# Patient Record
Sex: Female | Born: 1995 | Race: White | Hispanic: No | Marital: Single | Smoking: Current every day smoker
Health system: Southern US, Community
[De-identification: ages and names within clinical notes are randomized; demographics above are authoritative.]

## PROBLEM LIST (undated history)

## (undated) ENCOUNTER — Ambulatory Visit: Admission: EM | Source: Home / Self Care

## (undated) DIAGNOSIS — K219 Gastro-esophageal reflux disease without esophagitis: Secondary | ICD-10-CM

## (undated) DIAGNOSIS — J302 Other seasonal allergic rhinitis: Secondary | ICD-10-CM

## (undated) DIAGNOSIS — T753XXA Motion sickness, initial encounter: Secondary | ICD-10-CM

## (undated) DIAGNOSIS — R635 Abnormal weight gain: Secondary | ICD-10-CM

## (undated) DIAGNOSIS — D649 Anemia, unspecified: Secondary | ICD-10-CM

## (undated) DIAGNOSIS — K625 Hemorrhage of anus and rectum: Secondary | ICD-10-CM

## (undated) HISTORY — PX: OTHER SURGICAL HISTORY: SHX169

## (undated) HISTORY — DX: Other seasonal allergic rhinitis: J30.2

## (undated) HISTORY — DX: Abnormal weight gain: R63.5

## (undated) HISTORY — PX: MOUTH SURGERY: SHX715

---
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
## 2014-01-30 ENCOUNTER — Ambulatory Visit (INDEPENDENT_AMBULATORY_CARE_PROVIDER_SITE_OTHER): Payer: BC Managed Care – PPO | Admitting: Women's Health

## 2014-01-30 ENCOUNTER — Encounter: Payer: Self-pay | Admitting: Women's Health

## 2014-01-30 DIAGNOSIS — N390 Urinary tract infection, site not specified: Secondary | ICD-10-CM

## 2014-01-30 DIAGNOSIS — B3749 Other urogenital candidiasis: Secondary | ICD-10-CM

## 2014-01-30 LAB — URINALYSIS W MICROSCOPIC + REFLEX CULTURE
BILIRUBIN URINE: NEGATIVE
CASTS: NONE SEEN
CRYSTALS: NONE SEEN
GLUCOSE, UA: 100 mg/dL — AB
Nitrite: POSITIVE — AB
PH: 7 (ref 5.0–8.0)
Protein, ur: 100 mg/dL — AB
Specific Gravity, Urine: 1.015 (ref 1.005–1.030)
Urobilinogen, UA: 1 mg/dL (ref 0.0–1.0)

## 2014-01-30 MED ORDER — SULFAMETHOXAZOLE-TRIMETHOPRIM 800-160 MG PO TABS: 1.0000 | ORAL_TABLET | Freq: Two times a day (BID) | ORAL | Status: DC

## 2014-01-30 NOTE — Progress Notes (Signed)
Patient ID: Cheryl Morgan, female   DOB: 06/13/1996, 18 y.o.   MRN: [id number] Presents with burning with urination, increased urgency and different smell of urine for 4 days. Tried AZO with no relief.  Denies abnormal vaginal discharge, fever, abdominal pain. Monthly cycle on Skyla/same partner. States feels gets UTIs often relief with AZO the past.  UA: TNTC WBC, many bacteria, 3-6 RBC.  UTI  Plan: Bactrim 1 tablet BID x 5 days, use and side effects discussed. Counseled on UTI prevention. Instructed to call if no relief of symptoms.

## 2014-01-30 NOTE — Patient Instructions (Signed)
Urinary Tract Infection  Urinary tract infections (UTIs) can develop anywhere along your urinary tract. Your urinary tract is your body's drainage system for removing wastes and extra water. Your urinary tract includes two kidneys, two ureters, a bladder, and a urethra. Your kidneys are a pair of bean-shaped organs. Each kidney is about the size of your fist. They are located below your ribs, one on each side of your spine.  CAUSES  Infections are caused by microbes, which are microscopic organisms, including fungi, viruses, and bacteria. These organisms are so small that they can only be seen through a microscope. Bacteria are the microbes that most commonly cause UTIs.  SYMPTOMS   Symptoms of UTIs may vary by age and gender of the patient and by the location of the infection. Symptoms in Cheryl Morgan women typically include a frequent and intense urge to urinate and a painful, burning feeling in the bladder or urethra during urination. Older women and men are more likely to be tired, shaky, and weak and have muscle aches and abdominal pain. A fever may mean the infection is in your kidneys. Other symptoms of a kidney infection include pain in your back or sides below the ribs, nausea, and vomiting.  DIAGNOSIS  To diagnose a UTI, your caregiver will ask you about your symptoms. Your caregiver also will ask to provide a urine sample. The urine sample will be tested for bacteria and white blood cells. White blood cells are made by your body to help fight infection.  TREATMENT   Typically, UTIs can be treated with medication. Because most UTIs are caused by a bacterial infection, they usually can be treated with the use of antibiotics. The choice of antibiotic and length of treatment depend on your symptoms and the type of bacteria causing your infection.  HOME CARE INSTRUCTIONS   If you were prescribed antibiotics, take them exactly as your caregiver instructs you. Finish the medication even if you feel better after you  have only taken some of the medication.   Drink enough water and fluids to keep your urine clear or pale yellow.   Avoid caffeine, tea, and carbonated beverages. They tend to irritate your bladder.   Empty your bladder often. Avoid holding urine for long periods of time.   Empty your bladder before and after sexual intercourse.   After a bowel movement, women should cleanse from front to back. Use each tissue only once.  SEEK MEDICAL CARE IF:    You have back pain.   You develop a fever.   Your symptoms do not begin to resolve within 3 days.  SEEK IMMEDIATE MEDICAL CARE IF:    You have severe back pain or lower abdominal pain.   You develop chills.   You have nausea or vomiting.   You have continued burning or discomfort with urination.  MAKE SURE YOU:    Understand these instructions.   Will watch your condition.   Will get help right away if you are not doing well or get worse.  Document Released: 07/13/2005 Document Revised: 04/03/2012 Document Reviewed: 11/11/2011  Exit[employer] Patient Information 2014 [employer].

## 2014-02-03 ENCOUNTER — Other Ambulatory Visit: Payer: Self-pay | Admitting: Women's Health

## 2014-02-03 LAB — URINE CULTURE: Colony Count: 85000

## 2014-02-03 MED ORDER — CIPROFLOXACIN HCL 250 MG PO TABS: 250.0000 mg | ORAL_TABLET | Freq: Two times a day (BID) | ORAL | Status: DC

## 2014-07-02 ENCOUNTER — Encounter: Payer: Self-pay | Admitting: Gynecology

## 2014-07-02 ENCOUNTER — Ambulatory Visit (INDEPENDENT_AMBULATORY_CARE_PROVIDER_SITE_OTHER): Payer: BC Managed Care – PPO | Admitting: Gynecology

## 2014-07-02 VITALS — BP 120/76

## 2014-07-02 DIAGNOSIS — B002 Herpesviral gingivostomatitis and pharyngotonsillitis: Secondary | ICD-10-CM | POA: Insufficient documentation

## 2014-07-02 DIAGNOSIS — B3731 Acute candidiasis of vulva and vagina: Secondary | ICD-10-CM

## 2014-07-02 DIAGNOSIS — Z23 Encounter for immunization: Secondary | ICD-10-CM

## 2014-07-02 DIAGNOSIS — B373 Candidiasis of vulva and vagina: Secondary | ICD-10-CM

## 2014-07-02 DIAGNOSIS — Z113 Encounter for screening for infections with a predominantly sexual mode of transmission: Secondary | ICD-10-CM

## 2014-07-02 LAB — WET PREP FOR TRICH, YEAST, CLUE
CLUE CELLS WET PREP: NONE SEEN
TRICH WET PREP: NONE SEEN

## 2014-07-02 MED ORDER — VALACYCLOVIR HCL 500 MG PO TABS: ORAL_TABLET | ORAL | Status: DC

## 2014-07-02 MED ORDER — FLUCONAZOLE 150 MG PO TABS: 150.0000 mg | ORAL_TABLET | Freq: Once | ORAL | Status: DC

## 2014-07-02 NOTE — Addendum Note (Signed)
Addended by: Berna Spare A on: 07/02/2014 03:53 PM   Modules accepted: Orders

## 2014-07-02 NOTE — Progress Notes (Signed)
   18 year old who presented to the office today concerned about the possibility of having herpes labialis. Patient has had intercourse at 2 different individuals over the course of one month. She has the [place] IUD for contraception. Patient would like have a formal STD screening.  Exam: HEENT: Very small vesicular areas upper lip probable herpes labialis, no pharyngeal lesions seen : Pelvic exam: Bartholin urethra Skene glands within normal limits Vagina: No gross lesions or discharge Cervix: No gross lesions or discharge  Wet prep done demonstrating few yeast and few red blood cells and moderate bacteria  GC and Chlamydia culture obtained results pending at time of this dictation  Assessment/plan: Yeast vaginitis will be treated with Diflucan 150 mg one by mouth today. 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 STD screening consisting of herpes panel, HIV, RPR, hepatitis B, hepatitis C obtained today Flu vaccine received today. Patient has received a full HPV vaccine series in the past

## 2014-07-02 NOTE — Patient Instructions (Signed)
Herpes Labialis You have a fever blister or cold sore (herpes labialis). These painful, grouped sores are caused by one of the herpes viruses (HSV1 most commonly). They are usually found around the lips and mouth, but the same infection can also affect other areas on the face such as the nose and eyes. Herpes infections take about 10 days to heal. They often occur again and again in the same spot. Other symptoms may include numbness and tingling in the involved skin, achiness, fever, and swollen glands in the neck. Colds, emotional stress, injuries, or excess sunlight exposure all seem to make herpes reappear. Herpes lip infections are contagious. Direct contact with these sores can spread the infection. It can also be spread to other parts of your own body. TREATMENT  Herpes labialis is usually self-limited and resolves within 1 week. To reduce pain and swelling, apply ice packs frequently to the sores or suck on popsicles or frozen juice bars. Antiviral medicine may be used by mouth to shorten the duration of the breakout. Avoid spreading the infection by washing your hands often. Be careful not to touch your eyes or genital areas after handling the infected blisters. Do not kiss or have other intimate contact with others. After the blisters are completely healed you may resume contact. Use sunscreen to lessen recurrences.  If this is your first infection with herpes, or if you have a severe or repeated infections, your caregiver may prescribe one of the anti-viral drugs to speed up the healing. If you have sun-related flare-ups despite the use of sunscreen, starting oral anti-viral medicine before a prolonged exposure (going skiing or to the beach) can prevent most episodes.  SEEK IMMEDIATE MEDICAL CARE IF:  You develop a headache, sleepiness, high fever, vomiting, or severe weakness.  You have eye irritation, pain, blurred vision or redness.  You develop a prolonged infection not getting better in 10  days. Document Released: 10/03/2005 Document Revised: 12/26/2011 Document Reviewed: 08/07/2009 [facility] Patient Information 2015 [place], [place]. This information is not intended to replace advice given to you by your health care provider. Make sure you discuss any questions you have with your health care provider. Monilial Vaginitis Vaginitis in a soreness, swelling and redness (inflammation) of the vagina and vulva. Monilial vaginitis is not a sexually transmitted infection. CAUSES  Yeast vaginitis is caused by yeast (candida) that is normally found in your vagina. With a yeast infection, the candida has overgrown in number to a point that upsets the chemical balance. SYMPTOMS   White, thick vaginal discharge.  Swelling, itching, redness and irritation of the vagina and possibly the lips of the vagina (vulva).  Burning or painful urination.  Painful intercourse. DIAGNOSIS  Things that may contribute to monilial vaginitis are:  Postmenopausal and virginal states.  Pregnancy.  Infections.  Being tired, sick or stressed, especially if you had monilial vaginitis in the past.  Diabetes. Good control will help lower the chance.  Birth control pills.  Tight fitting garments.  Using bubble bath, feminine sprays, douches or deodorant tampons.  Taking certain medications that kill germs (antibiotics).  Sporadic recurrence can occur if you become ill. TREATMENT  Your caregiver will give you medication.  There are several kinds of anti monilial vaginal creams and suppositories specific for monilial vaginitis. For recurrent yeast infections, use a suppository or cream in the vagina 2 times a week, or as directed.  Anti-monilial or steroid cream for the itching or irritation of the vulva may also be used. Get your  caregiver's permission.  Painting the vagina with methylene blue solution may help if the monilial cream does not work.  Eating yogurt may help prevent monilial  vaginitis. HOME CARE INSTRUCTIONS   Finish all medication as prescribed.  Do not have sex until treatment is completed or after your caregiver tells you it is okay.  Take warm sitz baths.  Do not douche.  Do not use tampons, especially scented ones.  Wear cotton underwear.  Avoid tight pants and panty hose.  Tell your sexual partner that you have a yeast infection. They should go to their caregiver if they have symptoms such as mild rash or itching.  Your sexual partner should be treated as well if your infection is difficult to eliminate.  Practice safer sex. Use condoms.  Some vaginal medications cause latex condoms to fail. Vaginal medications that harm condoms are:  Cleocin cream.  Butoconazole (Femstat).  Terconazole (Terazol) vaginal suppository.  Miconazole (Monistat) (may be purchased over the counter). SEEK MEDICAL CARE IF:   You have a temperature by mouth above 102 F (38.9 C).  The infection is getting worse after 2 days of treatment.  The infection is not getting better after 3 days of treatment.  You develop blisters in or around your vagina.  You develop vaginal bleeding, and it is not your menstrual period.  You have pain when you urinate.  You develop intestinal problems.  You have pain with sexual intercourse. Document Released: 07/13/2005 Document Revised: 12/26/2011 Document Reviewed: 03/27/2009 [facility] Patient Information 2015 [place], [place]. This information is not intended to replace advice given to you by your health care provider. Make sure you discuss any questions you have with your health care provider.

## 2014-07-03 LAB — HSV(HERPES SMPLX)ABS-I+II(IGG+IGM)-BLD
HSV 1 GLYCOPROTEIN G AB, IGG: 7.41 IV — AB
HSV 2 Glycoprotein G Ab, IgG: <0.1 IV
Herpes Simplex Vrs I&II-IgM Ab (EIA): 0.72 INDEX

## 2014-07-03 LAB — HEPATITIS C ANTIBODY: HCV AB: NEGATIVE

## 2014-07-03 LAB — RPR

## 2014-07-03 LAB — GC/CHLAMYDIA PROBE AMP
CT Probe RNA: NEGATIVE
GC Probe RNA: NEGATIVE

## 2014-07-03 LAB — HIV ANTIBODY (ROUTINE TESTING W REFLEX): HIV 1&2 Ab, 4th Generation: NONREACTIVE

## 2014-07-03 LAB — HEPATITIS B SURFACE ANTIGEN: Hepatitis B Surface Ag: NEGATIVE

## 2014-09-23 ENCOUNTER — Ambulatory Visit (INDEPENDENT_AMBULATORY_CARE_PROVIDER_SITE_OTHER): Payer: BC Managed Care – PPO | Admitting: Women's Health

## 2014-09-23 ENCOUNTER — Encounter: Payer: Self-pay | Admitting: Women's Health

## 2014-09-23 VITALS — BP 120/78 | Ht 67.0 in | Wt 181.8 lb

## 2014-09-23 DIAGNOSIS — B009 Herpesviral infection, unspecified: Secondary | ICD-10-CM

## 2014-09-23 DIAGNOSIS — Z01419 Encounter for gynecological examination (general) (routine) without abnormal findings: Secondary | ICD-10-CM

## 2014-09-23 MED ORDER — VALACYCLOVIR HCL 500 MG PO TABS: ORAL_TABLET | ORAL | Status: DC

## 2014-09-23 NOTE — Progress Notes (Signed)
Cheryl Morgan 11/14/1995 [id number]    History:    Presents for annual exam.  Rare bleeding Cheryl Morgan placed 08/2012. Not sexually active greater than one year. Gardasil series completed. HSV 1 rare outbreaks. Has gained 20 pounds in past year, relates to lifestyle.  Past medical history, past surgical history, family history and social history were all reviewed and documented in the EPIC chart. Attending community college doing well, counseling.  ROS:  A  12 point ROS was performed and pertinent positives and negatives are included.  Exam:  Filed Vitals:   09/23/14 1452  BP: 120/78    General appearance:  Normal Thyroid:  Symmetrical, normal in size, without palpable masses or nodularity. Respiratory  Auscultation:  Clear without wheezing or rhonchi Cardiovascular  Auscultation:  Regular rate, without rubs, murmurs or gallops  Edema/varicosities:  Not grossly evident Abdominal  Soft,nontender, without masses, guarding or rebound.  Liver/spleen:  No organomegaly noted  Hernia:  None appreciated  Skin  Inspection:  Grossly normal   Breasts: Examined lying and sitting.     Right: Without masses, retractions, discharge or axillary adenopathy.     Left: Without masses, retractions, discharge or axillary adenopathy. Gentitourinary   Inguinal/mons:  Normal without inguinal adenopathy  External genitalia:  Normal  BUS/Urethra/Skene's glands:  Normal  Vagina:  Normal  Cervix:  Normal IUD strings visible  Uterus:  normal in size, shape and contour.  Midline and mobile  Adnexa/parametria:     Rt: Without masses or tenderness.   Lt: Without masses or tenderness.  Anus and perineum: Normal    Assessment/Plan:  18 y.o. SWF G0 for annual exam with no complaints.  08/2012 Cheryl Morgan rare bleeding  HSV 1  Plan: Valtrex 500 twice daily as needed for 3-5 days prescription, proper use given and reviewed. Aware Christean GriefSkyla will need to be removed and replaced 08/2015. Condoms encouraged if  sexually active. SBE's, exercise, calcium rich diet, MVI daily,  decrease calories encouraged and campus safety reviewed. CBC, UHarrington Challenger. Trexton Escamilla J [facility] CenterWHNP, 3:32 PM 09/23/2014

## 2014-09-23 NOTE — Patient Instructions (Signed)

## 2015-01-27 ENCOUNTER — Encounter: Payer: Self-pay | Admitting: Women's Health

## 2015-01-27 ENCOUNTER — Ambulatory Visit (INDEPENDENT_AMBULATORY_CARE_PROVIDER_SITE_OTHER): Payer: BLUE CROSS/BLUE SHIELD | Admitting: Women's Health

## 2015-01-27 ENCOUNTER — Ambulatory Visit (INDEPENDENT_AMBULATORY_CARE_PROVIDER_SITE_OTHER): Payer: BLUE CROSS/BLUE SHIELD

## 2015-01-27 ENCOUNTER — Other Ambulatory Visit: Payer: Self-pay | Admitting: Women's Health

## 2015-01-27 VITALS — BP 116/70

## 2015-01-27 DIAGNOSIS — Z30431 Encounter for routine checking of intrauterine contraceptive device: Secondary | ICD-10-CM

## 2015-01-27 DIAGNOSIS — N912 Amenorrhea, unspecified: Secondary | ICD-10-CM

## 2015-01-27 DIAGNOSIS — N831 Corpus luteum cyst of ovary, unspecified side: Secondary | ICD-10-CM

## 2015-01-27 DIAGNOSIS — Z113 Encounter for screening for infections with a predominantly sexual mode of transmission: Secondary | ICD-10-CM | POA: Diagnosis not present

## 2015-01-27 LAB — HEPATITIS C ANTIBODY: HCV AB: NEGATIVE

## 2015-01-27 LAB — HEPATITIS B SURFACE ANTIGEN: Hepatitis B Surface Ag: NEGATIVE

## 2015-01-27 NOTE — Progress Notes (Signed)
Patient ID: Cheryl Morgan, female   DOB: 07/05/1996, 19 y.o.   MRN: [id number] Presents with amenorrhea. Skyla IUD placed 08/2012 and has had light monthly cycles since. New partner. Reports negative UPT at home. Denies urinary symptoms abdominal pain or discharge.  Exam: Appears well. External genitalia within normal limits, speculum exam no discharge, GC/Chlamydia culture taken. IUD strings not visible. UPT negative Ultrasound: Transvaginal and transabdominal anteverted uterus homogeneous, IUD seen in normal position. Right ovary thick-walled corpus luteum cyst 20 x 19 x 18 mm positive CFD. Thick-walled follicle 11 x 14 mm. Left ovary normal. Negative cul-de-sac. No apparent mass noted.  Skyla IUD in proper place STD screen  Plan: GC/Chlamydia, HIV, hep B, C, RPR. Condoms encouraged until permanent partner. Reassurance given regarding normality of IUD placement and amenorrhea with IUD.

## 2015-01-28 LAB — HIV ANTIBODY (ROUTINE TESTING W REFLEX): HIV: NONREACTIVE

## 2015-01-28 LAB — GC/CHLAMYDIA PROBE AMP
CT Probe RNA: NEGATIVE
GC Probe RNA: NEGATIVE

## 2015-01-28 LAB — RPR

## 2015-01-28 LAB — HCG, SERUM, QUALITATIVE: PREG SERUM: NEGATIVE

## 2015-06-13 ENCOUNTER — Ambulatory Visit
Admission: EM | Admit: 2015-06-13 | Discharge: 2015-06-13 | Disposition: A | Discharge status: Home / Self Care | Payer: Self-pay | Attending: Family Medicine | Admitting: Family Medicine

## 2015-06-13 ENCOUNTER — Encounter: Payer: Self-pay | Admitting: Emergency Medicine

## 2015-06-13 DIAGNOSIS — J029 Acute pharyngitis, unspecified: Secondary | ICD-10-CM

## 2015-06-13 DIAGNOSIS — N390 Urinary tract infection, site not specified: Secondary | ICD-10-CM

## 2015-06-13 DIAGNOSIS — R509 Fever, unspecified: Secondary | ICD-10-CM

## 2015-06-13 LAB — URINALYSIS COMPLETE WITH MICROSCOPIC (ARMC ONLY)
Bilirubin Urine: NEGATIVE
GLUCOSE, UA: NEGATIVE mg/dL
Hgb urine dipstick: NEGATIVE
NITRITE: NEGATIVE
Specific Gravity, Urine: 1.02 (ref 1.005–1.030)
pH: 8.5 — ABNORMAL HIGH (ref 5.0–8.0)

## 2015-06-13 LAB — CBC WITH DIFFERENTIAL/PLATELET
BASOS PCT: 0 %
Basophils Absolute: 0.1 10*3/uL (ref 0–0.1)
EOS ABS: 0 10*3/uL (ref 0–0.7)
Eosinophils Relative: 0 %
HCT: 42.3 % (ref 35.0–47.0)
HEMOGLOBIN: 14.5 g/dL (ref 12.0–16.0)
Lymphocytes Relative: 5 %
Lymphs Abs: 1.1 10*3/uL (ref 1.0–3.6)
MCH: 31.8 pg (ref 26.0–34.0)
MCHC: 34.3 g/dL (ref 32.0–36.0)
MCV: 92.7 fL (ref 80.0–100.0)
Monocytes Absolute: 2.1 10*3/uL — ABNORMAL HIGH (ref 0.2–0.9)
Monocytes Relative: 11 %
NEUTROS PCT: 84 %
Neutro Abs: 16.4 10*3/uL — ABNORMAL HIGH (ref 1.4–6.5)
Platelets: 234 10*3/uL (ref 150–440)
RBC: 4.56 MIL/uL (ref 3.80–5.20)
RDW: 12.1 % (ref 11.5–14.5)
WBC: 19.7 10*3/uL — AB (ref 3.6–11.0)

## 2015-06-13 LAB — MONONUCLEOSIS SCREEN: MONO SCREEN: NEGATIVE

## 2015-06-13 LAB — PREGNANCY, URINE: Preg Test, Ur: NEGATIVE

## 2015-06-13 LAB — RAPID STREP SCREEN (MED CTR MEBANE ONLY): STREPTOCOCCUS, GROUP A SCREEN (DIRECT): NEGATIVE

## 2015-06-13 MED ORDER — DEXAMETHASONE SODIUM PHOSPHATE 10 MG/ML IJ SOLN
10.0000 mg | Freq: Once | INTRAMUSCULAR | Status: AC
  Administered 2015-06-13: 10 mg via INTRAMUSCULAR

## 2015-06-13 MED ORDER — IBUPROFEN 800 MG PO TABS
800.0000 mg | ORAL_TABLET | Freq: Once | ORAL | Status: AC
  Administered 2015-06-13: 800 mg via ORAL

## 2015-06-13 MED ORDER — ACETAMINOPHEN 500 MG PO TABS
1000.0000 mg | ORAL_TABLET | Freq: Once | ORAL | Status: AC
  Administered 2015-06-13: 1000 mg via ORAL

## 2015-06-13 MED ORDER — AMOXICILLIN 875 MG PO TABS: 875.0000 mg | ORAL_TABLET | Freq: Two times a day (BID) | ORAL | Status: DC

## 2015-06-13 MED ORDER — IBUPROFEN 800 MG PO TABS: 800.0000 mg | ORAL_TABLET | Freq: Three times a day (TID) | ORAL | Status: DC | PRN

## 2015-06-13 NOTE — ED Notes (Signed)
Low back pain for 2 days

## 2015-06-13 NOTE — ED Provider Notes (Signed)
[facility] Emergency Department Provider Note  ____________________________________________  Time seen: Approximately 1600 PM  I have reviewed the triage vital signs and the nursing notes.   HISTORY  Chief Complaint Sore Throat and Back Pain  Mother also at bedside HPI Cheryl Morgan is a 19 y.o. female presents to the complaints of sore throat. Patient reports sore throat 2 days. Reports intermittent fever times one day. States has not taken anything for fever today. States has not taken any over-the-counter medicines at all today. Reports continues to drink fluids very well. States she has drank at least 3 bottles of water today. States slight decrease in appetite. Also reports some accompanying low back pain. States that the low back pain is primarily when fever is present. Denies abdominal pain, nausea, vomiting, diarrhea, cough, shortness of breath, chest pain or congestion.Denies fall or injury. Denies changes in back pain with movements or positions changes.    Past Medical History  Diagnosis Date  . Seasonal allergies     Patient Active Problem List   Diagnosis Date Noted  . 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 Weight gain 08/01/2012  . Dysmenorrhea 08/01/2012  . Menorrhagia 08/01/2012    Past Surgical History  Procedure Laterality Date  . Mouth surgery    . Skyla      Inserted 08-17-12    Current Outpatient Rx  Name  Route  Sig  Dispense  Refill  . ibuprofen (ADVIL,MOTRIN) 200 MG tablet   Oral   Take 200 mg by mouth every 6 (six) hours as needed.         . Levonorgestrel (SKYLA) 13.5 MG IUD   Intrauterine   by Intrauterine route.           Allergies Review of patient's allergies indicates no known allergies.  Family History  Problem Relation Age of Onset  . Hypertension Maternal Aunt   . Diabetes Maternal Grandmother   . Hypertension Maternal Grandmother     Social History Social History  Substance Use Topics   . Smoking status: Never Smoker   . Smokeless tobacco: Never Used  . Alcohol Use: Yes    Review of Systems Constitutional: No fever/chills Eyes: No visual changes. ENT: positive sore throat. Cardiovascular: Denies chest pain. Respiratory: Denies shortness of breath. Gastrointestinal: No abdominal pain.  No nausea, no vomiting.  No diarrhea.  No constipation. Genitourinary: Negative for dysuria. Musculoskeletal: Negative for back pain. Skin: Negative for rash. Neurological: Negative for headaches, focal weakness or numbness.  10-point ROS otherwise negative.  ____________________________________________   PHYSICAL EXAM:  VITAL SIGNS: ED Triage Vitals  Enc Vitals Group     BP 06/13/15 1518 103/63 mmHg     Pulse Rate 06/13/15 1518 104     Resp 06/13/15 1518 18     Temp 06/13/15 1518 102.4 F (39.1 C)     Temp Source 06/13/15 1518 Tympanic     SpO2 06/13/15 1518 98 %     Weight 06/13/15 1518 172 lb (78.019 kg)     Height 06/13/15 1518  (1.702 m)     Head Cir --      Peak Flow --      Pain Score 06/13/15 1521 7     Pain Loc --      Pain Edu? --      Excl. in GC? --    Today's Vitals   06/13/15 1518 06/13/15 1521 06/13/15 1700 06/13/15 1733  BP: 103/63  99/63   Pulse:  104  90   Temp: 102.4 F (39.1 C)  102.3 F (39.1 C) 99.1 F (37.3 C)  TempSrc: Tympanic  Oral Oral  Resp: 18  18   Height: 5\' 7"  (1.702 m)     Weight: 172 lb (78.019 kg)     SpO2: 98%  98%   PainSc:  7  6  3       Constitutional: Alert and oriented. Well appearing and in no acute distress. Eyes: Conjunctivae are normal. PERRL. EOMI. Head: Atraumatic.  Ears: no erythema, normal TMs bilaterally.   Nose: No congestion/rhinnorhea.  Mouth/Throat: Mucous membranes are moist. Pharynx mod erythema, 3+bilateral tonsils with bilateral mild exudate. No uvular shift or deviation. Tolerating oral secretions. No drooling.  Neck: No stridor.  No cervical spine tenderness to  palpation. Hematological/Lymphatic/Immunilogical: mild anterior cervical lymphadenopathy. Cardiovascular: Normal rate, regular rhythm. Grossly normal heart sounds.  Good peripheral circulation. Respiratory: Normal respiratory effort.  No retractions. Lungs CTAB. Gastrointestinal: Soft and nontender. No distention. Normal Bowel sounds.  No abdominal bruits. No CVA tenderness.No splenomegaly.  Musculoskeletal: No lower or upper extremity tenderness nor edema.  No joint effusions. Bilateral pedal pulses equal and easily palpated. No cervical, thoracic or lumbar tenderness to palpation. No lumbar pain with movement. Full range of motion to neck and back. Neurologic:  Normal speech and language. No gross focal neurologic deficits are appreciated. No gait instability. Skin:  Skin is warm, dry and intact. No rash noted. Psychiatric: Mood and affect are normal. Speech and behavior are normal.  ____________________________________________   LABS (all labs ordered are listed, but only abnormal results are displayed)  Labs Reviewed  URINALYSIS COMPLETEWITH MICROSCOPIC ([facility] ONLY) - Abnormal; Notable for the following:    Ketones, ur TRACE (*)    pH 8.5 (*)    Protein, ur PRESENT (*)    Leukocytes, UA TRACE (*)    Bacteria, UA FEW (*)    Squamous Epithelial / LPF 6-30 (*)    All other components within normal limits  CBC WITH DIFFERENTIAL/PLATELET - Abnormal; Notable for the following:    WBC 19.7 (*)    Neutro Abs 16.4 (*)    Monocytes Absolute 2.1 (*)    All other components within normal limits  RAPID STREP SCREEN (NOT AT [facility])  CULTURE, GROUP A STREP ([facility] ONLY)  URINE CULTURE  PREGNANCY, URINE  MONONUCLEOSIS SCREEN    INITIAL IMPRESSION / ASSESSMENT AND PLAN / ED COURSE  Pertinent labs & imaging results that were available during my care of the patient were reviewed by me and considered in my medical decision making (see chart for details).  Very well-appearing patient. No acute  distress. Presents for sore throat with some accompanying intermittent low back pain. Denies fall or injury. Back pain unchanged with movement or position. Lungs clear throughout, abdomen soft and nontender.Suspect streptococcal pharyngitis.   Discussed patient and plan of care with Dr Judd Gaudier. Quick strep negative, will culture. Mono negative. WBC 19.7. Urine reviewed with few bacteria, trace leukocytes, and 6-30 squamous epithelial cells concern for contamination, will culture. Will treat with oral Amoxicillin for pharyngitis and UTI and culture urine. 10 mg IM decadron x one here. Discussed very strict follow up and return parameters. Follow up with PCP or return to urgent care in 3 days for follow up. Discussed return sooner or go to ER for worsening concerns. Including go to ER for difficulty swallowing, drooling, inability to eat or drink, fever not responding to medication, abdominal pain, or worsening concerns.  ____________________________________________  FINAL CLINICAL IMPRESSION(S) / ED DIAGNOSES  Final diagnoses:  Pharyngitis  Fever, unspecified fever cause  UTI (lower urinary tract infection)       Renford Dills, NP 06/13/15 1735

## 2015-06-13 NOTE — Discharge Instructions (Signed)
Take medication as prescribed. Rest. Drink plenty of fluids. Gargle warm salt water as needed for sore throat.   Follow up closely with your primary care physician . Follow up with your primary care physician or return to urgent care in 2-3 days for follow up. Return to urgent Care or go to ER for fever not responding to medication, inability to eat or drink, difficulty swallowing, abdominal pain, increased pain, new or worsening concerns.   Pharyngitis Pharyngitis is redness, pain, and swelling (inflammation) of your pharynx.  CAUSES  Pharyngitis is usually caused by infection. Most of the time, these infections are from viruses (viral) and are part of a cold. However, sometimes pharyngitis is caused by bacteria (bacterial). Pharyngitis can also be caused by allergies. Viral pharyngitis may be spread from person to person by coughing, sneezing, and personal items or utensils (cups, forks, spoons, toothbrushes). Bacterial pharyngitis may be spread from person to person by more intimate contact, such as kissing.  SIGNS AND SYMPTOMS  Symptoms of pharyngitis include:   Sore throat.   Tiredness (fatigue).   Low-grade fever.   Headache.  Joint pain and muscle aches.  Skin rashes.  Swollen lymph nodes.  Plaque-like film on throat or tonsils (often seen with bacterial pharyngitis). DIAGNOSIS  Your health care provider will ask you questions about your illness and your symptoms. Your medical history, along with a physical exam, is often all that is needed to diagnose pharyngitis. Sometimes, a rapid strep test is done. Other lab tests may also be done, depending on the suspected cause.  TREATMENT  Viral pharyngitis will usually get better in 3-4 days without the use of medicine. Bacterial pharyngitis is treated with medicines that kill germs (antibiotics).  HOME CARE INSTRUCTIONS   Drink enough water and fluids to keep your urine clear or pale yellow.   Only take over-the-counter or  prescription medicines as directed by your health care provider:   If you are prescribed antibiotics, make sure you finish them even if you start to feel better.   Do not take aspirin.   Get lots of rest.   Gargle with 8 oz of salt water ( tsp of salt per 1 qt of water) as often as every 1-2 hours to soothe your throat.   Throat lozenges (if you are not at risk for choking) or sprays may be used to soothe your throat. SEEK MEDICAL CARE IF:   You have large, tender lumps in your neck.  You have a rash.  You cough up green, yellow-brown, or bloody spit. SEEK IMMEDIATE MEDICAL CARE IF:   Your neck becomes stiff.  You drool or are unable to swallow liquids.  You vomit or are unable to keep medicines or liquids down.  You have severe pain that does not go away with the use of recommended medicines.  You have trouble breathing (not caused by a stuffy nose). MAKE SURE YOU:   Understand these instructions.  Will watch your condition.  Will get help right away if you are not doing well or get worse. Document Released: 10/03/2005 Document Revised: 07/24/2013 Document Reviewed: 06/10/2013 [facility] Patient Information 2015 [place], [place]. This information is not intended to replace advice given to you by your health care provider. Make sure you discuss any questions you have with your health care provider.  Urinary Tract Infection A urinary tract infection (UTI) can occur any place along the urinary tract. The tract includes the kidneys, ureters, bladder, and urethra. A type of germ called bacteria  often causes a UTI. UTIs are often helped with antibiotic medicine.  HOME CARE   If given, take antibiotics as told by your doctor. Finish them even if you start to feel better.  Drink enough fluids to keep your pee (urine) clear or pale yellow.  Avoid tea, drinks with caffeine, and bubbly (carbonated) drinks.  Pee often. Avoid holding your pee in for a long time.  Pee before  and after having sex (intercourse).  Wipe from front to back after you poop (bowel movement) if you are a woman. Use each tissue only once. GET HELP RIGHT AWAY IF:   You have back pain.  You have lower belly (abdominal) pain.  You have chills.  You feel sick to your stomach (nauseous).  You throw up (vomit).  Your burning or discomfort with peeing does not go away.  You have a fever.  Your symptoms are not better in 3 days. MAKE SURE YOU:   Understand these instructions.  Will watch your condition.  Will get help right away if you are not doing well or get worse. Document Released: 03/21/2008 Document Revised: 06/27/2012 Document Reviewed: 05/03/2012 [facility] Patient Information 2015 [place], [place]. This information is not intended to replace advice given to you by your health care provider. Make sure you discuss any questions you have with your health care provider.  Fever, Adult A fever is a temperature of 100.4 F (38 C) or above.  HOME CARE  Take fever medicine as told by your doctor. Do not  take aspirin for fever if you are younger than 20 years of age.  If you are given antibiotic medicine, take it as told. Finish the medicine even if you start to feel better.  Rest.  Drink enough fluids to keep your pee (urine) clear or pale yellow. Do not drink alcohol.  Take a bath or shower with room temperature water. Do not use ice water or alcohol sponge baths.  Wear lightweight, loose clothes. GET HELP RIGHT AWAY IF:   You are short of breath or have trouble breathing.  You are very weak.  You are dizzy or you pass out (faint).  You are very thirsty or are making little or no urine.  You have new pain.  You throw up (vomit) or have watery poop (diarrhea).  You keep throwing up or having watery poop for more than 1 to 2 days.  You have a stiff neck or light bothers your eyes.  You have a skin rash.  You have a fever or problems (symptoms) that last for  more than 2 to 3 days.  You have a fever and your problems quickly get worse.  You keep throwing up the fluids you drink.  You do not feel better after 3 days.  You have new problems. MAKE SURE YOU:   Understand these instructions.  Will watch your condition.  Will get help right away if you are not doing well or get worse. Document Released: 07/12/2008 Document Revised: 12/26/2011 Document Reviewed: 08/04/2011 [facility] Patient Information 2015 [place], [place]. This information is not intended to replace advice given to you by your health care provider. Make sure you discuss any questions you have with your health care provider.

## 2015-06-13 NOTE — ED Notes (Signed)
Sore throat for 2 days

## 2015-06-15 LAB — CULTURE, GROUP A STREP (THRC)

## 2015-06-15 LAB — URINE CULTURE: Special Requests: NORMAL

## 2015-06-18 ENCOUNTER — Telehealth: Payer: Self-pay | Admitting: Emergency Medicine

## 2015-06-18 NOTE — Telephone Encounter (Signed)
Called and left voicemail to follow up with patient regarding status as well as culture reports. Urine culture positive as below for enterococcus faecalis. Strep culture positive for streptococcus C. Patient was placed on oral amoxicillin and should continue which has effective coverage for both pathogens. Message left.   Specimen Description URINE, CLEAN CATCH   Special Requests Normal   Culture >=100,000 COLONIES/mL ENTEROCOCCUS FAECALIS   Report Status 06/15/2015 FINAL   Organism ID, Bacteria ENTEROCOCCUS FAECALIS   Resulting Agency [facility]    Culture & Susceptibility      ENTEROCOCCUS FAECALIS     Antibiotic Sensitivity Microscan Status    AMPICILLIN Sensitive <=2 SENSITIVE Final    Method: MIC    LINEZOLID Sensitive 2 SENSITIVE Final    Method: MIC    NITROFURANTOIN Sensitive <=16 SENSITIVE Final    Method: MIC    Comments ENTEROCOCCUS FAECALIS (MIC)    >=100,000 COLONIES/mL ENTEROCOCCUS FAECALIS

## 2015-06-18 NOTE — Telephone Encounter (Signed)
Spoke with patient and discussed lab results including cultures. Reports mild sore throat now, but has greatly improved and feels that she is getting better. Reports feeling better. Denies fevers. Reports eating and drinking well. Discussed follow up and return parameters. Patient verbalized understanding and agreed to plan.

## 2015-07-03 ENCOUNTER — Ambulatory Visit
Admission: EM | Admit: 2015-07-03 | Discharge: 2015-07-03 | Disposition: A | Discharge status: Home / Self Care | Payer: BLUE CROSS/BLUE SHIELD | Attending: Family Medicine | Admitting: Family Medicine

## 2015-07-03 DIAGNOSIS — J029 Acute pharyngitis, unspecified: Secondary | ICD-10-CM

## 2015-07-03 DIAGNOSIS — B2790 Infectious mononucleosis, unspecified without complication: Secondary | ICD-10-CM | POA: Diagnosis not present

## 2015-07-03 LAB — CBC WITH DIFFERENTIAL/PLATELET
BASOS ABS: 0.1 10*3/uL (ref 0–0.1)
BASOS PCT: 0 %
EOS PCT: 1 %
Eosinophils Absolute: 0.2 10*3/uL (ref 0–0.7)
HCT: 39.5 % (ref 35.0–47.0)
Hemoglobin: 13.6 g/dL (ref 12.0–16.0)
Lymphocytes Relative: 16 %
Lymphs Abs: 2.3 10*3/uL (ref 1.0–3.6)
MCH: 31.8 pg (ref 26.0–34.0)
MCHC: 34.5 g/dL (ref 32.0–36.0)
MCV: 92.3 fL (ref 80.0–100.0)
MONO ABS: 0.9 10*3/uL (ref 0.2–0.9)
Monocytes Relative: 6 %
Neutro Abs: 11.1 10*3/uL — ABNORMAL HIGH (ref 1.4–6.5)
Neutrophils Relative %: 77 %
PLATELETS: 337 10*3/uL (ref 150–440)
RBC: 4.29 MIL/uL (ref 3.80–5.20)
RDW: 12.2 % (ref 11.5–14.5)
WBC: 14.6 10*3/uL — ABNORMAL HIGH (ref 3.6–11.0)

## 2015-07-03 LAB — MONONUCLEOSIS SCREEN: Mono Screen: POSITIVE — AB

## 2015-07-03 LAB — RAPID STREP SCREEN (MED CTR MEBANE ONLY): STREPTOCOCCUS, GROUP A SCREEN (DIRECT): NEGATIVE

## 2015-07-03 MED ORDER — PREDNISONE 10 MG (21) PO TBPK: ORAL_TABLET | ORAL | Status: DC

## 2015-07-03 NOTE — ED Notes (Signed)
Treated last week for + strep. Given IM PCN. Been improving but last night felt that "uvula is enlarged". + bilateral tonsil swelling

## 2015-07-03 NOTE — ED Provider Notes (Addendum)
CSN: [id number]     Arrival date & time 07/03/15  1209 History   First MD Initiated Contact with Patient 07/03/15 1317     Chief Complaint  Patient presents with  . Sore Throat   (Consider location/radiation/quality/duration/timing/severity/associated sxs/prior Treatment) Patient is a 19 y.o. female presenting with pharyngitis. The history is provided by the patient. No language interpreter was used.  Sore Throat This is a new problem. The current episode started yesterday. The problem occurs constantly. The problem has been rapidly worsening. Pertinent negatives include no chest pain, no abdominal pain, no headaches and no shortness of breath. Associated symptoms comments: Patient reports that her mouth is injected was closing over because of acute onset swollen.. Nothing aggravates the symptoms. Nothing relieves the symptoms. She has tried nothing for the symptoms. The treatment provided no relief.    Past Medical History  Diagnosis Date  . Seasonal allergies    Past Surgical History  Procedure Laterality Date  . Mouth surgery    . 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 Hypertension Maternal Aunt   . Diabetes Maternal Grandmother   . Hypertension Maternal Grandmother    Social History  Substance Use Topics  . Smoking status: Never Smoker   . Smokeless tobacco: Never Used  . Alcohol Use: Yes   OB History    Gravida Para Term Preterm AB TAB SAB Ectopic Multiple Living   0    0          Review of Systems  HENT: Positive for sore throat and voice change. Negative for facial swelling and mouth sores.   Respiratory: Negative.  Negative for shortness of breath.   Cardiovascular: Negative for chest pain.  Gastrointestinal: Negative for abdominal pain.  Skin: Negative.   Neurological: Negative for headaches.  Psychiatric/Behavioral: Negative.   All other systems reviewed and are negative.   Allergies  Review of patient's allergies  indicates no known allergies.  Home Medications   Prior to Admission medications   Medication Sig Start Date End Date Taking? Authorizing Provider  ibuprofen (ADVIL,MOTRIN) 800 MG tablet Take 1 tablet (800 mg total) by mouth every 8 (eight) hours as needed for mild pain or moderate pain. 06/13/15  Yes Renford Dills, NP  Levonorgestrel (SKYLA) 13.5 MG IUD by Intrauterine route.   Yes Historical Provider, MD  amoxicillin (AMOXIL) 875 MG tablet Take 1 tablet (875 mg total) by mouth 2 (two) times daily. 06/13/15   Renford Dills, NP   Meds Ordered and Administered this Visit  Medications - No data to display  BP 106/73 mmHg  Pulse 65  Temp(Src) 97.8 F (36.6 C) (Tympanic)  Resp 16  Ht  (1.702 m)  Wt 170 lb (77.111 kg)  BMI 26.62 kg/m2  SpO2 99%  LMP 07/01/2015 No data found.   Physical Exam  Constitutional: She is oriented to person, place, and time. She appears well-developed and well-nourished.  HENT:  Head: Normocephalic and atraumatic.  Right Ear: Hearing, tympanic membrane and external ear normal.  Left Ear: Hearing and external ear normal.  Nose: Nose normal. No mucosal edema or rhinorrhea.  Mouth/Throat: Oral lesions present. No dental abscesses or dental caries. Posterior oropharyngeal erythema present. No oropharyngeal exudate or posterior oropharyngeal edema.  Tonsils are markedly swollen and hyperemic. And would agree that there is not much space between them.  Eyes: Pupils are equal, round, and reactive to light.  Neck: Normal range of  motion. Neck supple.  Musculoskeletal: Normal range of motion.  Neurological: She is alert and oriented to person, place, and time.  Skin: Skin is warm and dry.  Psychiatric: She has a normal mood and affect. Her behavior is normal.    ED Course  Procedures (including critical care time)  Labs Review Labs Reviewed  RAPID STREP SCREEN (NOT AT [facility])  CBC WITH DIFFERENTIAL/PLATELET  MONONUCLEOSIS SCREEN    Imaging  Review No results found.   Visual Acuity Review  Right Eye Distance:   Left Eye Distance:   Bilateral Distance:    Right Eye Near:   Left Eye Near:    Bilateral Near:      Results for orders placed or performed during the hospital encounter of 07/03/15  Rapid strep screen  Result Value Ref Range   Streptococcus, Group A Screen (Direct) NEGATIVE NEGATIVE  CBC with Differential  Result Value Ref Range   WBC 14.6 (H) 3.6 - 11.0 K/uL   RBC 4.29 3.80 - 5.20 MIL/uL   Hemoglobin 13.6 12.0 - 16.0 g/dL   HCT 16.1 09.6 - 04.5 %   MCV 92.3 80.0 - 100.0 fL   MCH 31.8 26.0 - 34.0 pg   MCHC 34.5 32.0 - 36.0 g/dL   RDW 40.9 81.1 - 91.4 %   Platelets 337 150 - 440 K/uL   Neutrophils Relative % 77 %   Neutro Abs 11.1 (H) 1.4 - 6.5 K/uL   Lymphocytes Relative 16 %   Lymphs Abs 2.3 1.0 - 3.6 K/uL   Monocytes Relative 6 %   Monocytes Absolute 0.9 0.2 - 0.9 K/uL   Eosinophils Relative 1 %   Eosinophils Absolute 0.2 0 - 0.7 K/uL   Basophils Relative 0 %   Basophils Absolute 0.1 0 - 0.1 K/uL  Mononucleosis screen  Result Value Ref Range   Mono Screen POSITIVE (A) NEGATIVE    MDM  No diagnosis found.   Patient informed Mono was positive. Since his been 1-2 days this has occurred and tonsils ready almost touching placed on prednisone for 66 days. Work note for Monday the warned that she'll need to follow-up PCP of choice as far as returning to activities and sports.  Hassan Rowan, MD 07/03/15 1526  Hassan Rowan, MD 07/03/15 2103   Patient strep was positive. I asked the nursing staff to contact patient Sunday to let her know but I don't see a note in the chart the pack and asked nurse to call the patient and call a Z-Pak for her since she has Mono and does not want to use ampicillin or amoxicillin.  Hassan Rowan, MD 07/07/15 1726

## 2015-07-03 NOTE — Discharge Instructions (Signed)
Infectious Mononucleosis Infectious mononucleosis (mono) is a common viral infection. You can get mono from close contact with someone who is infected. If you have mono, you may have a sore throat, headache, feel tired, or have a fever. HOME CARE  Drink enough fluids to keep your pee (urine) clear or pale yellow.  Eat soft foods. Eat cold foods (frozen ice pops, ice cream) to make your throat feel better.  Only take medicines as told by your doctor. Do not give aspirin to children.  Rest as needed. Have someone with you to make sure you do not get worse.  Do not  play contact sports. Avoid activities where you could get hurt for 3 to 4 weeks. The organ that cleans your blood (spleen) might be puffy (swollen), and it could get hurt.  Wash your hands or use hand sanitizer often. Avoid kissing or sharing your drinking glass until your doctor says you are better.  Keep all follow-up visits as told by your doctor. GET HELP RIGHT AWAY IF:   You have strong pain in your stomach or shoulder.  You have trouble breathing or swallowing.  You are confused.  You get a strong headache or stiff neck.  You are shaking (convulsing).  You keep throwing up (vomiting).  You are weak, with pale skin, dry mouth, and fast heartbeat.  Your fever does not go away after 7 days.  You cannot return to normal activities after 2 weeks.  You have yellow color in the eyes and skin (jaundice). MAKE SURE YOU:   Understand these instructions.  Will watch your condition.  Will get help right away if you are not doing well or get worse. Document Released: 09/21/2009 Document Revised: 12/26/2011 Document Reviewed: 09/21/2009 [facility] Patient Information 2015 [place], [place]. This information is not intended to replace advice given to you by your health care provider. Make sure you discuss any questions you have with your health care provider.  Pharyngitis Pharyngitis is a sore throat (pharynx). There is  redness, pain, and swelling of your throat. HOME CARE   Drink enough fluids to keep your pee (urine) clear or pale yellow.  Only take medicine as told by your doctor.  You may get sick again if you do not take medicine as told. Finish your medicines, even if you start to feel better.  Do not take aspirin.  Rest.  Rinse your mouth (gargle) with salt water ( tsp of salt per 1 qt of water) every 1-2 hours. This will help the pain.  If you are not at risk for choking, you can suck on hard candy or sore throat lozenges. GET HELP IF:  You have large, tender lumps on your neck.  You have a rash.  You cough up green, yellow-brown, or bloody spit. GET HELP RIGHT AWAY IF:   You have a stiff neck.  You drool or cannot swallow liquids.  You throw up (vomit) or are not able to keep medicine or liquids down.  You have very bad pain that does not go away with medicine.  You have problems breathing (not from a stuffy nose). MAKE SURE YOU:   Understand these instructions.  Will watch your condition.  Will get help right away if you are not doing well or get worse. Document Released: 03/21/2008 Document Revised: 07/24/2013 Document Reviewed: 06/10/2013 [facility] Patient Information 2015 [place], [place]. This information is not intended to replace advice given to you by your health care provider. Make sure you discuss any questions you  have with your health care provider. ° °

## 2015-07-06 LAB — CULTURE, GROUP A STREP (THRC)

## 2015-07-09 ENCOUNTER — Other Ambulatory Visit: Payer: Self-pay

## 2015-07-10 ENCOUNTER — Ambulatory Visit (INDEPENDENT_AMBULATORY_CARE_PROVIDER_SITE_OTHER): Payer: BLUE CROSS/BLUE SHIELD | Admitting: Physician Assistant

## 2015-07-10 ENCOUNTER — Encounter: Payer: Self-pay | Admitting: Physician Assistant

## 2015-07-10 VITALS — BP 110/70 | HR 92 | Temp 97.7°F | Resp 20 | Ht 66.0 in | Wt 176.0 lb

## 2015-07-10 DIAGNOSIS — F329 Major depressive disorder, single episode, unspecified: Secondary | ICD-10-CM | POA: Diagnosis not present

## 2015-07-10 DIAGNOSIS — Z7189 Other specified counseling: Secondary | ICD-10-CM | POA: Diagnosis not present

## 2015-07-10 DIAGNOSIS — R300 Dysuria: Secondary | ICD-10-CM

## 2015-07-10 DIAGNOSIS — Z0000 Encounter for general adult medical examination without abnormal findings: Secondary | ICD-10-CM | POA: Diagnosis not present

## 2015-07-10 DIAGNOSIS — F419 Anxiety disorder, unspecified: Secondary | ICD-10-CM

## 2015-07-10 DIAGNOSIS — Z7689 Persons encountering health services in other specified circumstances: Secondary | ICD-10-CM

## 2015-07-10 DIAGNOSIS — R4589 Other symptoms and signs involving emotional state: Secondary | ICD-10-CM

## 2015-07-10 LAB — POCT URINALYSIS DIPSTICK
BILIRUBIN UA: NEGATIVE
GLUCOSE UA: NEGATIVE
KETONES UA: NEGATIVE
Leukocytes, UA: NEGATIVE
Nitrite, UA: NEGATIVE
Protein, UA: NEGATIVE
RBC UA: NEGATIVE
SPEC GRAV UA: 1.015
UROBILINOGEN UA: 0.2
pH, UA: 6

## 2015-07-10 MED ORDER — BUPROPION HCL ER (SR) 150 MG PO TB12: 150.0000 mg | ORAL_TABLET | Freq: Two times a day (BID) | ORAL | Status: DC

## 2015-07-10 MED ORDER — ALPRAZOLAM 0.25 MG PO TABS: 0.2500 mg | ORAL_TABLET | Freq: Two times a day (BID) | ORAL | Status: DC | PRN

## 2015-07-10 NOTE — Patient Instructions (Addendum)
Health Maintenance Adopting a healthy lifestyle and getting preventive care can go a long way to promote health and wellness. Talk with your health care provider about what schedule of regular examinations is right for you. This is a good chance for you to check in with your provider about disease prevention and staying healthy. In between checkups, there are plenty of things you can do on your own. Experts have done a lot of research about which lifestyle changes and preventive measures are most likely to keep you healthy. Ask your health care provider for more information. WEIGHT AND DIET  Eat a healthy diet  Be sure to include plenty of vegetables, fruits, low-fat dairy products, and lean protein.  Do not eat a lot of foods high in solid fats, added sugars, or salt.  Get regular exercise. This is one of the most important things you can do for your health.  Most adults should exercise for at least 150 minutes each week. The exercise should increase your heart rate and make you sweat (moderate-intensity exercise).  Most adults should also do strengthening exercises at least twice a week. This is in addition to the moderate-intensity exercise.  Maintain a healthy weight  Body mass index (BMI) is a measurement that can be used to identify possible weight problems. It estimates body fat based on height and weight. Your health care provider can help determine your BMI and help you achieve or maintain a healthy weight.  For females 61 years of age and older:   A BMI below 18.5 is considered underweight.  A BMI of 18.5 to 24.9 is normal.  A BMI of 25 to 29.9 is considered overweight.  A BMI of 30 and above is considered obese.  Watch levels of cholesterol and blood lipids  You should start having your blood tested for lipids and cholesterol at 20 years of age, then have this test every 5 years.  You may need to have your cholesterol levels checked more often if:  Your lipid or  cholesterol levels are high.  You are older than 20 years of age.  You are at high risk for heart disease.  CANCER SCREENING   Lung Cancer  Lung cancer screening is recommended for adults 77-19 years old who are at high risk for lung cancer because of a history of smoking.  A yearly low-dose CT scan of the lungs is recommended for people who:  Currently smoke.  Have quit within the past 15 years.  Have at least a 30-pack-year history of smoking. A pack year is smoking an average of one pack of cigarettes a day for 1 year.  Yearly screening should continue until it has been 15 years since you quit.  Yearly screening should stop if you develop a health problem that would prevent you from having lung cancer treatment.  Breast Cancer  Practice breast self-awareness. This means understanding how your breasts normally appear and feel.  It also means doing regular breast self-exams. Let your health care provider know about any changes, no matter how small.  If you are in your 20s or 30s, you should have a clinical breast exam (CBE) by a health care provider every 1-3 years as part of a regular health exam.  If you are 15 or older, have a CBE every year. Also consider having a breast X-ray (mammogram) every year.  If you have a family history of breast cancer, talk to your health care provider about genetic screening.  If you are  at high risk for breast cancer, talk to your health care provider about having an MRI and a mammogram every year.  Breast cancer gene (BRCA) assessment is recommended for women who have family members with BRCA-related cancers. BRCA-related cancers include:  Breast.  Ovarian.  Tubal.  Peritoneal cancers.  Results of the assessment will determine the need for genetic counseling and BRCA1 and BRCA2 testing. Cervical Cancer Routine pelvic examinations to screen for cervical cancer are no longer recommended for nonpregnant women who are considered low  risk for cancer of the pelvic organs (ovaries, uterus, and vagina) and who do not have symptoms. A pelvic examination may be necessary if you have symptoms including those associated with pelvic infections. Ask your health care provider if a screening pelvic exam is right for you.   The Pap test is the screening test for cervical cancer for women who are considered at risk.  If you had a hysterectomy for a problem that was not cancer or a condition that could lead to cancer, then you no longer need Pap tests.  If you are older than 65 years, and you have had normal Pap tests for the past 10 years, you no longer need to have Pap tests.  If you have had past treatment for cervical cancer or a condition that could lead to cancer, you need Pap tests and screening for cancer for at least 20 years after your treatment.  If you no longer get a Pap test, assess your risk factors if they change (such as having a new sexual partner). This can affect whether you should start being screened again.  Some women have medical problems that increase their chance of getting cervical cancer. If this is the case for you, your health care provider may recommend more frequent screening and Pap tests.  The human papillomavirus (HPV) test is another test that may be used for cervical cancer screening. The HPV test looks for the virus that can cause cell changes in the cervix. The cells collected during the Pap test can be tested for HPV.  The HPV test can be used to screen women 30 years of age and older. Getting tested for HPV can extend the interval between normal Pap tests from three to five years.  An HPV test also should be used to screen women of any age who have unclear Pap test results.  After 20 years of age, women should have HPV testing as often as Pap tests.  Colorectal Cancer  This type of cancer can be detected and often prevented.  Routine colorectal cancer screening usually begins at 20 years of  age and continues through 20 years of age.  Your health care provider may recommend screening at an earlier age if you have risk factors for colon cancer.  Your health care provider may also recommend using home test kits to check for hidden blood in the stool.  A small camera at the end of a tube can be used to examine your colon directly (sigmoidoscopy or colonoscopy). This is done to check for the earliest forms of colorectal cancer.  Routine screening usually begins at age 50.  Direct examination of the colon should be repeated every 5-10 years through 20 years of age. However, you may need to be screened more often if early forms of precancerous polyps or small growths are found. Skin Cancer  Check your skin from head to toe regularly.  Tell your health care provider about any new moles or changes in   moles, especially if there is a change in a mole's shape or color.  Also tell your health care provider if you have a mole that is larger than the size of a pencil eraser.  Always use sunscreen. Apply sunscreen liberally and repeatedly throughout the day.  Protect yourself by wearing long sleeves, pants, a wide-brimmed hat, and sunglasses whenever you are outside. HEART DISEASE, DIABETES, AND HIGH BLOOD PRESSURE   Have your blood pressure checked at least every 1-2 years. High blood pressure causes heart disease and increases the risk of stroke.  If you are between 75 years and 42 years old, ask your health care provider if you should take aspirin to prevent strokes.  Have regular diabetes screenings. This involves taking a blood sample to check your fasting blood sugar level.  If you are at a normal weight and have a low risk for diabetes, have this test once every three years after 20 years of age.  If you are overweight and have a high risk for diabetes, consider being tested at a younger age or more often. PREVENTING INFECTION  Hepatitis B  If you have a higher risk for  hepatitis B, you should be screened for this virus. You are considered at high risk for hepatitis B if:  You were born in a country where hepatitis B is common. Ask your health care provider which countries are considered high risk.  Your parents were born in a high-risk country, and you have not been immunized against hepatitis B (hepatitis B vaccine).  You have HIV or AIDS.  You use needles to inject street drugs.  You live with someone who has hepatitis B.  You have had sex with someone who has hepatitis B.  You get hemodialysis treatment.  You take certain medicines for conditions, including cancer, organ transplantation, and autoimmune conditions. Hepatitis C  Blood testing is recommended for:  Everyone born from [birth year] through 1966.  Anyone with known risk factors for hepatitis C. Sexually transmitted infections (STIs)  You should be screened for sexually transmitted infections (STIs) including gonorrhea and chlamydia if:  You are sexually active and are younger than 20 years of age.  You are older than 20 years of age and your health care provider tells you that you are at risk for this type of infection.  Your sexual activity has changed since you were last screened and you are at an increased risk for chlamydia or gonorrhea. Ask your health care provider if you are at risk.  If you do not have HIV, but are at risk, it may be recommended that you take a prescription medicine daily to prevent HIV infection. This is called pre-exposure prophylaxis (PrEP). You are considered at risk if:  You are sexually active and do not regularly use condoms or know the HIV status of your partner(s).  You take drugs by injection.  You are sexually active with a partner who has HIV. Talk with your health care provider about whether you are at high risk of being infected with HIV. If you choose to begin PrEP, you should first be tested for HIV. You should then be tested every 3 months for  as long as you are taking PrEP.  PREGNANCY   If you are premenopausal and you may become pregnant, ask your health care provider about preconception counseling.  If you may become pregnant, take 400 to 800 micrograms (mcg) of folic acid every day.  If you want to prevent pregnancy, talk to your  health care provider about birth control (contraception). OSTEOPOROSIS AND MENOPAUSE   Osteoporosis is a disease in which the bones lose minerals and strength with aging. This can result in serious bone fractures. Your risk for osteoporosis can be identified using a bone density scan.  If you are 41 years of age or older, or if you are at risk for osteoporosis and fractures, ask your health care provider if you should be screened.  Ask your health care provider whether you should take a calcium or vitamin D supplement to lower your risk for osteoporosis.  Menopause may have certain physical symptoms and risks.  Hormone replacement therapy may reduce some of these symptoms and risks. Talk to your health care provider about whether hormone replacement therapy is right for you.  HOME CARE INSTRUCTIONS   Schedule regular health, dental, and eye exams.  Stay current with your immunizations.   Do not use any tobacco products including cigarettes, chewing tobacco, or electronic cigarettes.  If you are pregnant, do not drink alcohol.  If you are breastfeeding, limit how much and how often you drink alcohol.  Limit alcohol intake to no more than 1 drink per day for nonpregnant women. One drink equals 12 ounces of beer, 5 ounces of wine, or 1 ounces of hard liquor.  Do not use street drugs.  Do not share needles.  Ask your health care provider for help if you need support or information about quitting drugs.  Tell your health care provider if you often feel depressed.  Tell your health care provider if you have ever been abused or do not feel safe at home. Document Released: 04/18/2011  Document Revised: 02/17/2014 Document Reviewed: 09/04/2013 [facility] Patient Information 2015 [place], [place]. This information is not intended to replace advice given to you by your health care provider. Make sure you discuss any questions you have with your health care provider.  Bupropion sustained-release tablets (Depression/Mood Disorders) What is this medicine? BUPROPION (byoo PROE pee on) is used to treat depression. This medicine may be used for other purposes; ask your health care provider or pharmacist if you have questions. COMMON BRAND NAME(S): Budeprion SR, Wellbutrin SR What should I tell my health care provider before I take this medicine? They need to know if you have any of these conditions: -an eating disorder, such as anorexia or bulimia -bipolar disorder or psychosis -diabetes or high blood sugar, treated with medication -glaucoma -head injury or brain tumor -heart disease, previous heart attack, or irregular heart beat -high blood pressure -kidney or liver disease -seizures -suicidal thoughts or a previous suicide attempt -Tourette's syndrome -weight loss -an unusual or allergic reaction to bupropion, other medicines, foods, dyes, or preservatives -breast-feeding -pregnant or trying to become pregnant How should I use this medicine? Take this medicine by mouth with a glass of water. Follow the directions on the prescription label. You can take it with or without food. If it upsets your stomach, take it with food. Do not cut, crush or chew this medicine. Take your medicine at regular intervals. If you take this medicine more than once a day, take your second dose at least 8 hours after you take your first dose. To limit difficulty in sleeping, avoid taking this medicine at bedtime. Do not take your medicine more often than directed. Do not stop taking this medicine suddenly except upon the advice of your doctor. Stopping this medicine too quickly may cause serious side  effects or your condition may worsen. A special MedGuide  will be given to you by the pharmacist with each prescription and refill. Be sure to read this information carefully each time. Talk to your pediatrician regarding the use of this medicine in children. Special care may be needed. Overdosage: If you think you have taken too much of this medicine contact a poison control center or emergency room at once. NOTE: This medicine is only for you. Do not share this medicine with others. What if I miss a dose? If you miss a dose, skip the missed dose and take your next tablet at the regular time. There should be at least 8 hours between doses. Do not take double or extra doses. What may interact with this medicine? Do not take this medicine with any of the following medications: -linezolid -MAOIs like Azilect, Carbex, Eldepryl, Marplan, Nardil, and Parnate -methylene blue (injected into a vein) -other medicines that contain bupropion like Zyban This medicine may also interact with the following medications: -alcohol -certain medicines for anxiety or sleep -certain medicines for blood pressure like metoprolol, propranolol -certain medicines for depression or psychotic disturbances -certain medicines for HIV or AIDS like efavirenz, lopinavir, nelfinavir, ritonavir -certain medicines for irregular heart beat like propafenone, flecainide -certain medicines for Parkinson's disease like amantadine, levodopa -certain medicines for seizures like carbamazepine, phenytoin, phenobarbital -cimetidine -clopidogrel -cyclophosphamide -furazolidone -isoniazid -nicotine -orphenadrine -procarbazine -steroid medicines like prednisone or cortisone -stimulant medicines for attention disorders, weight loss, or to stay awake -tamoxifen -theophylline -thiotepa -ticlopidine -tramadol -warfarin This list may not describe all possible interactions. Give your health care provider a list of all the medicines,  herbs, non-prescription drugs, or dietary supplements you use. Also tell them if you smoke, drink alcohol, or use illegal drugs. Some items may interact with your medicine. What should I watch for while using this medicine? Tell your doctor if your symptoms do not get better or if they get worse. Visit your doctor or health care professional for regular checks on your progress. Because it may take several weeks to see the full effects of this medicine, it is important to continue your treatment as prescribed by your doctor. Patients and their families should watch out for new or worsening thoughts of suicide or depression. Also watch out for sudden changes in feelings such as feeling anxious, agitated, panicky, irritable, hostile, aggressive, impulsive, severely restless, overly excited and hyperactive, or not being able to sleep. If this happens, especially at the beginning of treatment or after a change in dose, call your health care professional. Avoid alcoholic drinks while taking this medicine. Drinking excessive alcoholic beverages, using sleeping or anxiety medicines, or quickly stopping the use of these agents while taking this medicine may increase your risk for a seizure. Do not drive or use heavy machinery until you know how this medicine affects you. This medicine can impair your ability to perform these tasks. Do not take this medicine close to bedtime. It may prevent you from sleeping. Your mouth may get dry. Chewing sugarless gum or sucking hard candy, and drinking plenty of water may help. Contact your doctor if the problem does not go away or is severe. What side effects may I notice from receiving this medicine? Side effects that you should report to your doctor or health care professional as soon as possible: -allergic reactions like skin rash, itching or hives, swelling of the face, lips, or tongue -breathing problems -changes in vision -confusion -fast or irregular  heartbeat -hallucinations -increased blood pressure -redness, blistering, peeling or loosening of the skin,  including inside the mouth -seizures -suicidal thoughts or other mood changes -unusually weak or tired -vomiting Side effects that usually do not require medical attention (report to your doctor or health care professional if they continue or are bothersome): -change in sex drive or performance -constipation -headache -loss of appetite -nausea -tremors -weight loss This list may not describe all possible side effects. Call your doctor for medical advice about side effects. You may report side effects to FDA at 1-800-FDA-1088. Where should I keep my medicine? Keep out of the reach of children. Store at room temperature between 20 and 25 degrees C (68 and 77 degrees F), away from direct sunlight and moisture. Keep tightly closed. Throw away any unused medicine after the expiration date. NOTE: This sheet is a summary. It may not cover all possible information. If you have questions about this medicine, talk to your doctor, pharmacist, or health care provider.  2015, [employer]. (2013-04-26 12:41:10)

## 2015-07-10 NOTE — Progress Notes (Signed)
Patient: Cheryl Morgan, Female    DOB: 28-Apr-1996, 19 y.o.   MRN: [id number] Visit Date: 07/10/2015  Today's Provider: Margaretann Loveless, PA-C   Chief Complaint  Patient presents with  . Establish Care  . Annual Exam   Subjective:    Annual physical exam [name] is a 19 y.o. female who presents today for health maintenance and complete physical. She feels well. She reports exercising active all the time due to her job. She reports she is sleeping poorly. Patient was recently with mono last week. The week prior to her mono diagnosis she was diagnosed with strep throat and successfully treated. Patient also states that has been feeling anxious and nervous lately. She states that she does feel there is a family history of this. She has never taken any medication for anxiety or depression. She does also state that she has felt like she is in a depressed mood. She states that things that she used to enjoy she no longer partakes in. She also reports some low back pain that she feels is secondary to her job. 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 She does have a OB/GYN, Dr. Lily Peer. She does have an IUD in place. She states it has been in place for the last 3 years and is due to be removed in December of this year. She states that with the IUD her menstrual cycles are irregular occurring every 3-4 months. She states they're very light. She denies any dysmenorrhea.    Review of Systems  Constitutional: Positive for fatigue.  Eyes: Negative.   Respiratory: Positive for chest tightness and shortness of breath.   Cardiovascular: Negative.   Gastrointestinal: Negative.   Endocrine: Positive for polyuria.  Genitourinary: Positive for dysuria and difficulty urinating.  Musculoskeletal: Positive for back pain.  Skin: Negative.   Allergic/Immunologic: Negative.   Neurological: Negative.   Hematological: Negative.     Psychiatric/Behavioral: Negative.     Social History She  reports that she has never smoked. She has never used smokeless tobacco. She reports that she drinks alcohol. She reports that she does not use illicit drugs. Social History   Social History  . Marital Status: Single    Spouse Name: N/A  . Number of Children: N/A  . Years of Education: N/A   Social History Main Topics  . Smoking status: Never Smoker   . Smokeless tobacco: Never Used  . Alcohol Use: Yes     Comment: occassionally  . Drug Use: No  . Sexual Activity: Yes    Birth Control/ Protection: IUD     Comment: Skyla inserted 08-17-12   Other Topics Concern  . None   Social History Narrative    Patient Active Problem List   Diagnosis Date Noted  . 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 Weight gain 08/01/2012  . Dysmenorrhea 08/01/2012  . Menorrhagia 08/01/2012  . Excessive and frequent menstruation 08/01/2012    Past Surgical History  Procedure Laterality Date  . Mouth surgery    . Skyla      Inserted 08-17-12    Family History  Family Status  Relation Status Death Age  . Mother Alive   . Father Alive    Her family history includes Depression in her maternal grandmother; Diabetes in her maternal grandmother; Hypertension in her maternal aunt and maternal grandmother.    No Known  Allergies  Previous Medications   ALBUTEROL (PROAIR HFA) 108 (90 BASE) MCG/ACT INHALER    Inhale into the lungs.   IBUPROFEN (ADVIL,MOTRIN) 800 MG TABLET    Take 1 tablet (800 mg total) by mouth every 8 (eight) hours as needed for mild pain or moderate pain.   LEVONORGESTREL (SKYLA) 13.5 MG IUD    by Intrauterine route.    Patient Care Team: Margaretann Loveless, PA-C as PCP - General (Physician Assistant)     Objective:   Vitals: BP 110/70 mmHg  Pulse 92  Temp(Src) 97.7 F (36.5 C) (Oral)  Resp 20  Ht  (1.676 m)  Wt 176 lb (79.833 kg)  BMI 28.42 kg/m2  LMP  07/01/2015   Physical Exam  Constitutional: She is oriented to person, place, and time. She appears well-developed and well-nourished. No distress.  HENT:  Head: Normocephalic and atraumatic.  Right Ear: Hearing, tympanic membrane, external ear and ear canal normal.  Left Ear: Hearing, tympanic membrane, external ear and ear canal normal.  Nose: Nose normal.  Mouth/Throat: Uvula is midline, oropharynx is clear and moist and mucous membranes are normal. No oropharyngeal exudate.  Tonsils are very large but not kissing.  She denies any difficulties swallowing.  Eyes: Conjunctivae and EOM are normal. Pupils are equal, round, and reactive to light. Right eye exhibits no discharge. Left eye exhibits no discharge. No scleral icterus.  Neck: Normal range of motion. Neck supple. No JVD present. No tracheal deviation present. No thyromegaly present.  Cardiovascular: Normal rate, regular rhythm, normal heart sounds and intact distal pulses.  Exam reveals no gallop and no friction rub.   No murmur heard. Pulmonary/Chest: Effort normal and breath sounds normal. No respiratory distress. She has no wheezes. She has no rales. She exhibits no tenderness.  Abdominal: Soft. Bowel sounds are normal. She exhibits no distension and no mass. There is tenderness in the suprapubic area. There is no rebound and no guarding.  Musculoskeletal: Normal range of motion. She exhibits no edema or tenderness.  Lymphadenopathy:    She has no cervical adenopathy.  Neurological: She is alert and oriented to person, place, and time.  Skin: Skin is warm and dry. No rash noted. She is not diaphoretic.  Psychiatric: She has a normal mood and affect. Her behavior is normal. Judgment and thought content normal.  Vitals reviewed.    Depression Screen No flowsheet data found.    Assessment & Plan:     Routine Health Maintenance and Physical Exam  Exercise Activities and Dietary recommendations Goals    None       Immunization History  Administered Date(s) Administered  . HPV Quadrivalent 01/18/2012, 03/19/2012, 08/01/2012  . Influenza,inj,Quad PF,36+ Mos 07/02/2014    Health Maintenance  Topic Date Due  . TETANUS/TDAP  09/19/2014  . INFLUENZA VACCINE  05/18/2015  . CHLAMYDIA SCREENING  01/27/2016  . HIV Screening  Completed      Discussed health benefits of physical activity, and encouraged her to engage in regular exercise appropriate for her age and condition.  1. Encounter to establish care  2. Annual physical exam Physical exam today was normal with the exception of enlarged tonsils. She denies having difficulty swallowing. She did recently have strep throat and was also diagnosed with mono last week. I did advise her that if she does develop strep throat again or if she notices that her tonsils become enlarged and are kissing like before that I would recommend her being referred to an ENT for  further evaluation and possible consideration of tonsillectomy. She agrees. I will check other labs as below due to some lower leg fatigue that she is starting to develop. I did discuss this could be secondary to her mono but I will check her labs, specifically her CMP to check her potassium levels. - CBC with Differential - Comprehensive metabolic panel - Lipid panel - TSH  3. Anxiety This is a new onset. She states that is not debilitating. It only occurs approximately once or twice a week. She does however state that she is having more and more difficulty falling asleep due to racing thoughts. I will give her a prescription of Xanax as below. I did advise her to take 1 tablet at night to help her sleep and then she may take the other tablet if needed for anxiety/panic attack. I will follow-up with her in 4 weeks to evaluate and see how she is doing. - ALPRAZolam (XANAX) 0.25 MG tablet; Take 1 tablet (0.25 mg total) by mouth 2 (two) times daily as needed for anxiety.  Dispense: 30 tablet; Refill:  0  4. Dysuria She does report having some difficulty with urination stating that it almost feels like she is not completely emptying her bladder and she has to sit on the toilet for a long period of time. She also has some suprapubic tenderness. She denies any foul smell, burning, vaginal discharge. Her UA in the office today was normal. I will still send her urine for culture since she is having these other symptoms to rule out possible infection. Depending on the results of the culture I will send in an antibiotic if required per those results. - POCT Urinalysis Dipstick - Urine Culture  5. Depressed mood I will do a trial of Wellbutrin as below. I did advise her to take 1 tablet by mouth once daily for the first week and then increased to the 2 tablets daily as directed on the prescription. She voiced understanding. I will follow-up with her in 4 weeks to see how her mood is and to evaluate the effectiveness of the medication. She is to call the office if she has any severe adverse effects or if her symptoms worsen. - buPROPion (WELLBUTRIN SR) 150 MG 12 hr tablet; Take 1 tablet (150 mg total) by mouth 2 (two) times daily.  Dispense: 60 tablet; Refill: 1  --------------------------------------------------------------------

## 2015-07-11 LAB — URINE CULTURE

## 2015-07-13 ENCOUNTER — Telehealth: Payer: Self-pay

## 2015-07-13 NOTE — Telephone Encounter (Signed)
LMTCB 07/13/15  Thanks,  -Emaan Gary 

## 2015-07-13 NOTE — Telephone Encounter (Signed)
-----   Message from Margaretann Loveless, [place] sent at 07/13/2015  3:24 PM EDT ----- Urine culture was negative.

## 2015-07-14 NOTE — Telephone Encounter (Signed)
Patient advised as directed below.  Thanks,  -Joseline 

## 2015-07-31 ENCOUNTER — Telehealth: Payer: Self-pay

## 2015-07-31 LAB — COMPREHENSIVE METABOLIC PANEL
A/G RATIO: 1.3 (ref 1.1–2.5)
ALBUMIN: 4.3 g/dL (ref 3.5–5.5)
ALT: 9 IU/L (ref 0–32)
AST: 17 IU/L (ref 0–40)
Alkaline Phosphatase: 78 IU/L (ref 39–117)
BILIRUBIN TOTAL: 0.5 mg/dL (ref 0.0–1.2)
BUN / CREAT RATIO: 11 (ref 8–20)
BUN: 8 mg/dL (ref 6–20)
CALCIUM: 9.6 mg/dL (ref 8.7–10.2)
CO2: 26 mmol/L (ref 18–29)
Chloride: 100 mmol/L (ref 97–108)
Creatinine, Ser: 0.72 mg/dL (ref 0.57–1.00)
GFR, EST AFRICAN AMERICAN: 140 mL/min/{1.73_m2} (ref >59)
GFR, EST NON AFRICAN AMERICAN: 122 mL/min/{1.73_m2} (ref >59)
GLOBULIN, TOTAL: 3.3 g/dL (ref 1.5–4.5)
GLUCOSE: 91 mg/dL (ref 65–99)
POTASSIUM: 4.5 mmol/L (ref 3.5–5.2)
SODIUM: 140 mmol/L (ref 134–144)
TOTAL PROTEIN: 7.6 g/dL (ref 6.0–8.5)

## 2015-07-31 LAB — LIPID PANEL
CHOL/HDL RATIO: 3.1 ratio (ref 0.0–4.4)
Cholesterol, Total: 163 mg/dL (ref 100–169)
HDL: 53 mg/dL (ref >39)
LDL Calculated: 93 mg/dL (ref 0–109)
Triglycerides: 86 mg/dL (ref 0–89)
VLDL Cholesterol Cal: 17 mg/dL (ref 5–40)

## 2015-07-31 LAB — CBC WITH DIFFERENTIAL/PLATELET
BASOS ABS: 0.1 10*3/uL (ref 0.0–0.2)
Basos: 1 %
EOS (ABSOLUTE): 0.2 10*3/uL (ref 0.0–0.4)
Eos: 2 %
Hematocrit: 41 % (ref 34.0–46.6)
Hemoglobin: 14.1 g/dL (ref 11.1–15.9)
IMMATURE GRANS (ABS): 0 10*3/uL (ref 0.0–0.1)
IMMATURE GRANULOCYTES: 0 %
LYMPHS: 26 %
Lymphocytes Absolute: 2 10*3/uL (ref 0.7–3.1)
MCH: 32.3 pg (ref 26.6–33.0)
MCHC: 34.4 g/dL (ref 31.5–35.7)
MCV: 94 fL (ref 79–97)
Monocytes Absolute: 0.6 10*3/uL (ref 0.1–0.9)
Monocytes: 8 %
NEUTROS ABS: 4.9 10*3/uL (ref 1.4–7.0)
Neutrophils: 63 %
PLATELETS: 373 10*3/uL (ref 150–379)
RBC: 4.37 x10E6/uL (ref 3.77–5.28)
RDW: 12.6 % (ref 12.3–15.4)
WBC: 7.7 10*3/uL (ref 3.4–10.8)

## 2015-07-31 LAB — TSH: TSH: 2.19 u[IU]/mL (ref 0.450–4.500)

## 2015-07-31 NOTE — Telephone Encounter (Signed)
Patient advised as directed below.  Thanks,  -Abdulraheem Pineo 

## 2015-07-31 NOTE — Telephone Encounter (Signed)
-----   Message from Margaretann LovelessJennifer M Burnette, PA-C sent at 07/31/2015  8:18 AM EDT ----- All labs are stable and WNL.

## 2015-08-07 ENCOUNTER — Ambulatory Visit: Payer: BLUE CROSS/BLUE SHIELD | Admitting: Physician Assistant

## 2015-08-27 ENCOUNTER — Other Ambulatory Visit: Payer: Self-pay | Admitting: Physician Assistant

## 2015-08-27 DIAGNOSIS — F329 Major depressive disorder, single episode, unspecified: Principal | ICD-10-CM

## 2015-08-27 DIAGNOSIS — R4589 Other symptoms and signs involving emotional state: Secondary | ICD-10-CM

## 2015-08-27 MED ORDER — BUPROPION HCL ER (SR) 150 MG PO TB12: 150.0000 mg | ORAL_TABLET | Freq: Two times a day (BID) | ORAL | Status: DC

## 2015-09-25 ENCOUNTER — Encounter: Payer: Self-pay | Admitting: *Deleted

## 2015-09-25 ENCOUNTER — Encounter: Payer: Self-pay | Admitting: Women's Health

## 2015-09-25 ENCOUNTER — Ambulatory Visit (INDEPENDENT_AMBULATORY_CARE_PROVIDER_SITE_OTHER): Payer: BLUE CROSS/BLUE SHIELD | Admitting: Women's Health

## 2015-09-25 ENCOUNTER — Telehealth: Payer: Self-pay | Admitting: *Deleted

## 2015-09-25 VITALS — BP 124/80 | Ht 67.0 in | Wt 172.0 lb

## 2015-09-25 DIAGNOSIS — Z113 Encounter for screening for infections with a predominantly sexual mode of transmission: Secondary | ICD-10-CM

## 2015-09-25 DIAGNOSIS — Z23 Encounter for immunization: Secondary | ICD-10-CM | POA: Diagnosis not present

## 2015-09-25 NOTE — Telephone Encounter (Signed)
GC probe will be placed in fridge until we receive a call back from patient

## 2015-09-25 NOTE — Patient Instructions (Signed)
Levonorgestrel intrauterine device (IUD) What is this medicine? LEVONORGESTREL IUD (LEE voe nor jes trel) is a contraceptive (birth control) device. The device is placed inside the uterus by a healthcare professional. It is used to prevent pregnancy and can also be used to treat heavy bleeding that occurs during your period. Depending on the device, it can be used for 3 to 5 years. This medicine may be used for other purposes; ask your health care provider or pharmacist if you have questions. What should I tell my health care provider before I take this medicine? They need to know if you have any of these conditions: -abnormal Pap smear -cancer of the breast, uterus, or cervix -diabetes -endometritis -genital or pelvic infection now or in the past -have more than one sexual partner or your partner has more than one partner -heart disease -history of an ectopic or tubal pregnancy -immune system problems -IUD in place -liver disease or tumor -problems with blood clots or take blood-thinners -use intravenous drugs -uterus of unusual shape -vaginal bleeding that has not been explained -an unusual or allergic reaction to levonorgestrel, other hormones, silicone, or polyethylene, medicines, foods, dyes, or preservatives -pregnant or trying to get pregnant -breast-feeding How should I use this medicine? This device is placed inside the uterus by a health care professional. Talk to your pediatrician regarding the use of this medicine in children. Special care may be needed. Overdosage: If you think you have taken too much of this medicine contact a poison control center or emergency room at once. NOTE: This medicine is only for you. Do not share this medicine with others. What if I miss a dose? This does not apply. What may interact with this medicine? Do not take this medicine with any of the following medications: -amprenavir -bosentan -fosamprenavir This medicine may also interact with  the following medications: -aprepitant -barbiturate medicines for inducing sleep or treating seizures -bexarotene -griseofulvin -medicines to treat seizures like carbamazepine, ethotoin, felbamate, oxcarbazepine, phenytoin, topiramate -modafinil -pioglitazone -rifabutin -rifampin -rifapentine -some medicines to treat HIV infection like atazanavir, indinavir, lopinavir, nelfinavir, tipranavir, ritonavir -St. John's wort -warfarin This list may not describe all possible interactions. Give your health care provider a list of all the medicines, herbs, non-prescription drugs, or dietary supplements you use. Also tell them if you smoke, drink alcohol, or use illegal drugs. Some items may interact with your medicine. What should I watch for while using this medicine? Visit your doctor or health care professional for regular check ups. See your doctor if you or your partner has sexual contact with others, becomes HIV positive, or gets a sexual transmitted disease. This product does not protect you against HIV infection (AIDS) or other sexually transmitted diseases. You can check the placement of the IUD yourself by reaching up to the top of your vagina with clean fingers to feel the threads. Do not pull on the threads. It is a good habit to check placement after each menstrual period. Call your doctor right away if you feel more of the IUD than just the threads or if you cannot feel the threads at all. The IUD may come out by itself. You may become pregnant if the device comes out. If you notice that the IUD has come out use a backup birth control method like condoms and call your health care provider. Using tampons will not change the position of the IUD and are okay to use during your period. What side effects may I notice from receiving this medicine?   Side effects that you should report to your doctor or health care professional as soon as possible: -allergic reactions like skin rash, itching or  hives, swelling of the face, lips, or tongue -fever, flu-like symptoms -genital sores -high blood pressure -no menstrual period for 6 weeks during use -pain, swelling, warmth in the leg -pelvic pain or tenderness -severe or sudden headache -signs of pregnancy -stomach cramping -sudden shortness of breath -trouble with balance, talking, or walking -unusual vaginal bleeding, discharge -yellowing of the eyes or skin Side effects that usually do not require medical attention (report to your doctor or health care professional if they continue or are bothersome): -acne -breast pain -change in sex drive or performance -changes in weight -cramping, dizziness, or faintness while the device is being inserted -headache -irregular menstrual bleeding within first 3 to 6 months of use -nausea This list may not describe all possible side effects. Call your doctor for medical advice about side effects. You may report side effects to FDA at 1-800-FDA-1088. Where should I keep my medicine? This does not apply. NOTE: This sheet is a summary. It may not cover all possible information. If you have questions about this medicine, talk to your doctor, pharmacist, or health care provider.    2016, Elsevier/Gold Standard. (2011-11-03 13:54:04)  

## 2015-09-25 NOTE — Telephone Encounter (Signed)
Pt was supposed to give us a call back before 5 pm to verify if she has had GC Chlamydia done at other office per Maryelizabeth RowanNancy Young NP to proceed with IUD,left message for pt to return our call

## 2015-09-25 NOTE — Progress Notes (Signed)
Patient ID: Cheryl Morgan, female   DOB: 08/16/1996, 19 y.o.   MRN: [id number] Presents for skyla to be removed and replaced. Skyla placed 08/2012. Appointment was scheduled for annual exam. New partner for the past few months/condoms consistently. States had annual exam at primary care several weeks ago and thinks she had a negative STD screen. Reports being amenorrheic until recent with skyla.  Exam: Appears well. External genitalia within normal limits, speculum exam cervix pink without lesions IUD strings visible, GC/Chlamydia culture taken.  STD screen Contraception management  Plan: Instructed to call primary care to see if GC/Chlamydia culture done at primary care annual exam and call with results. GC/Chlamydia culture held/pending. Contraception options reviewed, would like to continue with Skyla. instructed to schedule appointment with Dr. Lily PeerFernandez to remove and replace Skyla, abstain until or use condoms. Will check coverage.

## 2015-09-26 LAB — URINALYSIS W MICROSCOPIC + REFLEX CULTURE
BACTERIA UA: NONE SEEN [HPF]
BILIRUBIN URINE: NEGATIVE
CRYSTALS: NONE SEEN [HPF]
Casts: NONE SEEN [LPF]
Glucose, UA: NEGATIVE
KETONES UR: NEGATIVE
Nitrite: NEGATIVE
PROTEIN: NEGATIVE
Specific Gravity, Urine: 1.018 (ref 1.001–1.035)
Yeast: NONE SEEN [HPF]
pH: 6.5 (ref 5.0–8.0)

## 2015-09-27 LAB — URINE CULTURE

## 2015-10-21 ENCOUNTER — Telehealth: Payer: Self-pay | Admitting: Gynecology

## 2015-10-21 NOTE — Telephone Encounter (Signed)
10/21/15-I LM VM for pt [facility] for [facility] at 100%, no copay [facility]. REF #[id number]

## 2015-10-26 ENCOUNTER — Encounter: Payer: Self-pay | Admitting: Gynecology

## 2015-10-26 ENCOUNTER — Ambulatory Visit (INDEPENDENT_AMBULATORY_CARE_PROVIDER_SITE_OTHER): Payer: BLUE CROSS/BLUE SHIELD | Admitting: Gynecology

## 2015-10-26 VITALS — BP 120/72

## 2015-10-26 DIAGNOSIS — Z30433 Encounter for removal and reinsertion of intrauterine contraceptive device: Secondary | ICD-10-CM | POA: Diagnosis not present

## 2015-10-26 DIAGNOSIS — Z975 Presence of (intrauterine) contraceptive device: Secondary | ICD-10-CM | POA: Insufficient documentation

## 2015-10-26 NOTE — Progress Notes (Signed)
Patient is a 20 year old who presented to the office today to remove her expired Skyla IUD and replaced with a new one. Patient has had good success with it.                                                                     IUD procedure note       Patient presented to the office today for placement of Skyla IUD. The patient had previously been provided with literature information on this method of contraception. The risks benefits and pros and cons were discussed and all her questions were answered. She is fully aware that this form of contraception is 99% effective and is good for 3 years.  Pelvic exam: Bartholin urethra Skene glands: Within normal limits Vagina: No lesions or discharge Cervix: No lesions or discharge, IUD string was visualized. With the use of a small hole the IUD string was brought out to the external cervical os and grasped with a [place] clamp and retrieved shown to the patient discarded. Uterus: Retroverted position Adnexa: No masses or tenderness Rectal exam: Not done  The cervix was cleansed with Betadine solution. A single-tooth tenaculum was placed on the anterior cervical lip. The uterus sounded to 7-1/2 centimeter. The IUD was shown to the patient and inserted in a sterile fashion. The IUD string was trimmed. The single-tooth tenaculum was removed. Patient was instructed to return back to the office in one month for follow up.       Skyla three-year IUD placed January 2017 Lot number: [id number]                                       Skyla three-year IUD placed January 2017 Lot number: [id number]

## 2015-10-27 ENCOUNTER — Encounter: Payer: Self-pay | Admitting: Gynecology

## 2015-11-23 ENCOUNTER — Ambulatory Visit: Payer: BLUE CROSS/BLUE SHIELD | Admitting: Gynecology

## 2016-02-02 ENCOUNTER — Ambulatory Visit (INDEPENDENT_AMBULATORY_CARE_PROVIDER_SITE_OTHER): Payer: BLUE CROSS/BLUE SHIELD | Admitting: Family Medicine

## 2016-02-02 ENCOUNTER — Encounter: Payer: Self-pay | Admitting: Family Medicine

## 2016-02-02 VITALS — BP 102/58 | HR 67 | Temp 98.0°F | Resp 14 | Wt 175.6 lb

## 2016-02-02 DIAGNOSIS — R300 Dysuria: Secondary | ICD-10-CM

## 2016-02-02 LAB — POCT URINALYSIS DIPSTICK
BILIRUBIN UA: NEGATIVE
GLUCOSE UA: NEGATIVE
KETONES UA: NEGATIVE
Nitrite, UA: POSITIVE
Protein, UA: NEGATIVE
SPEC GRAV UA: 1.015
UROBILINOGEN UA: 2
pH, UA: 7.5

## 2016-02-02 MED ORDER — NITROFURANTOIN MONOHYD MACRO 100 MG PO CAPS: 100.0000 mg | ORAL_CAPSULE | Freq: Two times a day (BID) | ORAL | Status: DC

## 2016-02-02 NOTE — Progress Notes (Signed)
Patient ID: Cheryl Morgan, female   DOB: [birth year], 20 y.o.   MRN: [id number]   Patient: Cheryl Morgan Female    DOB: [birth year]   19 y.o.   MRN: [id number] Visit Date: 02/02/2016  Today's Provider: Dortha Kern, PA   Chief Complaint  Patient presents with  . Urinary Tract Infection   Subjective:    Urinary Tract Infection  This is a new problem. Episode onset: 3 days ago. The problem occurs intermittently. The problem has been unchanged. The quality of the pain is described as burning (and pressure). Associated symptoms include urgency. Treatments tried: Azo. The treatment provided moderate relief.    Past Medical History  Diagnosis Date  . Seasonal allergies    Patient Active Problem List   Diagnosis Date Noted  . IUD (intrauterine device) in place 10/26/2015  . 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 Weight gain 08/01/2012  . Dysmenorrhea 08/01/2012  . Menorrhagia 08/01/2012  . Excessive and frequent menstruation 08/01/2012   Past Surgical History  Procedure Laterality Date  . Mouth surgery    . 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 Hypertension Maternal Aunt   . Diabetes Maternal Grandmother   . Hypertension Maternal Grandmother   . Depression Maternal Grandmother    Previous Medications   ALBUTEROL (PROAIR HFA) 108 (90 BASE) MCG/ACT INHALER    Inhale into the lungs.   ALPRAZOLAM (XANAX) 0.25 MG TABLET    Take 1 tablet (0.25 mg total) by mouth 2 (two) times daily as needed for anxiety.   BUPROPION (WELLBUTRIN SR) 150 MG 12 HR TABLET    Take 1 tablet (150 mg total) by mouth 2 (two) times daily.   IBUPROFEN (ADVIL,MOTRIN) 800 MG TABLET    Take 1 tablet (800 mg total) by mouth every 8 (eight) hours as needed for mild pain or moderate pain.   LEVONORGESTREL (SKYLA) 13.5 MG IUD    by Intrauterine route.   No Known Allergies  Review of Systems  Constitutional: Negative.     HENT: Negative.   Eyes: Negative.   Respiratory: Negative.   Cardiovascular: Negative.   Gastrointestinal: Negative.   Endocrine: Negative.   Genitourinary: Positive for dysuria and urgency.  Skin: Negative.   Allergic/Immunologic: Negative.   Neurological: Negative.   Hematological: Negative.   Psychiatric/Behavioral: Negative.     Social History  Substance Use Topics  . Smoking status: Never Smoker   . Smokeless tobacco: Never Used  . Alcohol Use: Yes     Comment: occassionally   Objective:   BP 102/58 mmHg  Pulse 67  Temp(Src) 98 F (36.7 C) (Oral)  Resp 14  Wt 175 lb 9.6 oz (79.652 kg)  Physical Exam  Constitutional: She is oriented to person, place, and time. She appears well-developed and well-nourished. No distress.  HENT:  Head: Normocephalic and atraumatic.  Right Ear: Hearing normal.  Left Ear: Hearing normal.  Nose: Nose normal.  Eyes: Conjunctivae and lids are normal. Right eye exhibits no discharge. Left eye exhibits no discharge. No scleral icterus.  Cardiovascular: Normal rate and regular rhythm.   Pulmonary/Chest: Effort normal and breath sounds normal. No respiratory distress.  Abdominal: Soft. Bowel sounds are normal. There is tenderness.  Suprapubic - no CVA tenderness to percussion posteriorly.  Musculoskeletal: Normal range of motion.  Neurological: She is alert and oriented to person, place, and time.  Skin: Skin is intact. No  lesion and no rash noted.  Psychiatric: She has a normal mood and affect. Her speech is normal and behavior is normal. Thought content normal.      Assessment & Plan:     1. Dysuria Onset 3 days ago. Urinalysis shows WBC's and bacteria. May continue AZO-Standard for discomfort. Will get C&S and start antibiotic. Increase fluid intake. No menses of significance with IUD placed 3 years ago. Had a replacement in January 2017 by GYN. - POCT urinalysis dipstick - Urine culture - nitrofurantoin, macrocrystal-monohydrate,  (MACROBID) 100 MG capsule; Take 1 capsule (100 mg total) by mouth 2 (two) times daily.  Dispense: 20 capsule; Refill: 0

## 2016-02-02 NOTE — Patient Instructions (Signed)

## 2016-02-04 LAB — URINE CULTURE

## 2016-02-08 ENCOUNTER — Telehealth: Payer: Self-pay

## 2016-02-08 NOTE — Telephone Encounter (Signed)
-----   Message from Tamsen Roersennis E Chrismon, [place] sent at 02/04/2016  2:55 PM EDT ----- Final report confirms E.coli is the pathogen and it is sensitive to all antibiotics tested (including the Macrobid). Finish all the antibiotic and recheck in 7-10 days if any symptoms remain.

## 2016-02-08 NOTE — Telephone Encounter (Signed)
Patient advised as directed below. Patient verbalized understanding.  

## 2016-02-24 ENCOUNTER — Ambulatory Visit (INDEPENDENT_AMBULATORY_CARE_PROVIDER_SITE_OTHER): Payer: BLUE CROSS/BLUE SHIELD | Admitting: Physician Assistant

## 2016-02-24 ENCOUNTER — Encounter: Payer: Self-pay | Admitting: Physician Assistant

## 2016-02-24 VITALS — BP 100/60 | HR 70 | Temp 98.2°F | Resp 16 | Wt 174.6 lb

## 2016-02-24 DIAGNOSIS — R300 Dysuria: Secondary | ICD-10-CM

## 2016-02-24 DIAGNOSIS — B379 Candidiasis, unspecified: Secondary | ICD-10-CM

## 2016-02-24 DIAGNOSIS — Z113 Encounter for screening for infections with a predominantly sexual mode of transmission: Secondary | ICD-10-CM

## 2016-02-24 DIAGNOSIS — N3001 Acute cystitis with hematuria: Secondary | ICD-10-CM

## 2016-02-24 LAB — POCT URINALYSIS DIPSTICK
Bilirubin, UA: NEGATIVE
Glucose, UA: NEGATIVE
Ketones, UA: NEGATIVE
Nitrite, UA: NEGATIVE
Protein, UA: NEGATIVE
Spec Grav, UA: 1.02
Urobilinogen, UA: 0.2
pH, UA: 6

## 2016-02-24 MED ORDER — CIPROFLOXACIN HCL 500 MG PO TABS: 500.0000 mg | ORAL_TABLET | Freq: Two times a day (BID) | ORAL | Status: DC

## 2016-02-24 NOTE — Progress Notes (Signed)
Patient: Cheryl Morgan Female    DOB: January 30, 1996   20 y.o.   MRN: [id number] Visit Date: 02/24/2016  Today's Provider: Margaretann Loveless, PA-C   Chief Complaint  Patient presents with  . Urinary Tract Infection   Subjective:    Urinary Tract Infection  This is a new problem. The current episode started 1 to 4 weeks ago (She reports that she started to have the symptoms again after having intercourse). The problem occurs every urination. The problem has been gradually worsening. The quality of the pain is described as burning and aching. The pain is mild. There has been no fever. She is sexually active. Associated symptoms include chills, flank pain (Lower back), frequency, hematuria (last night), nausea (all week) and urgency. Pertinent negatives include no discharge. She has tried increased fluids for the symptoms. The treatment provided no relief. Her past medical history is significant for recurrent UTIs.   She was treated with macrobid in 02/02/16 for UTI. Urine culture showed E.coli that was susceptible to all antibiotics.   She has a new sexual partner and is also asking for some STD testing. She declines wanting HIV or syphillis labs.    No Known Allergies Previous Medications   ALBUTEROL (PROAIR HFA) 108 (90 BASE) MCG/ACT INHALER    Inhale into the lungs.   ALPRAZOLAM (XANAX) 0.25 MG TABLET    Take 1 tablet (0.25 mg total) by mouth 2 (two) times daily as needed for anxiety.   BUPROPION (WELLBUTRIN SR) 150 MG 12 HR TABLET    Take 1 tablet (150 mg total) by mouth 2 (two) times daily.   IBUPROFEN (ADVIL,MOTRIN) 800 MG TABLET    Take 1 tablet (800 mg total) by mouth every 8 (eight) hours as needed for mild pain or moderate pain.   LEVONORGESTREL (SKYLA) 13.5 MG IUD    by Intrauterine route.   NITROFURANTOIN, MACROCRYSTAL-MONOHYDRATE, (MACROBID) 100 MG CAPSULE    Take 1 capsule (100 mg total) by mouth 2 (two) times daily.    Review of Systems  Constitutional: Positive  for chills and fatigue. Negative for fever.  Respiratory: Negative.   Cardiovascular: Negative.   Gastrointestinal: Positive for nausea (all week) and abdominal pain.  Genitourinary: Positive for dysuria, urgency, frequency, hematuria (last night), flank pain (Lower back) and vaginal pain. Negative for vaginal bleeding and vaginal discharge.  Musculoskeletal: Positive for back pain.    Social History  Substance Use Topics  . Smoking status: Never Smoker   . Smokeless tobacco: Never Used  . Alcohol Use: Yes     Comment: occassionally   Objective:   BP 100/60 mmHg  Pulse 70  Temp(Src) 98.2 F (36.8 C) (Oral)  Resp 16  Wt 174 lb 9.6 oz (79.198 kg)  Physical Exam  Constitutional: She is oriented to person, place, and time. She appears well-developed and well-nourished. No distress.  Cardiovascular: Normal rate, regular rhythm and normal heart sounds.  Exam reveals no gallop and no friction rub.   No murmur heard. Pulmonary/Chest: Effort normal and breath sounds normal. No respiratory distress. She has no wheezes. She has no rales.  Abdominal: Soft. Normal appearance and bowel sounds are normal. She exhibits no distension and no mass. There is no hepatosplenomegaly. There is tenderness in the suprapubic area. There is no rebound, no guarding and no CVA tenderness.  Suprapubic pressure  Genitourinary: Vagina normal. There is no rash, lesion or injury on the right labia. There is no rash, tenderness, lesion or  injury on the left labia. No erythema, tenderness or bleeding in the vagina. No signs of injury around the vagina. No vaginal discharge found.  Neurological: She is alert and oriented to person, place, and time.  Skin: Skin is warm and dry. She is not diaphoretic.  Vitals reviewed.       Assessment & Plan:     1. Acute cystitis with hematuria Urine was positive for small pyruria and hematuria. Will treat empirically with Cipro as below. Continue to push fluids and use AZO.  Discussed post-coital voiding to prevent UTI. Will await culture and adjust antibiotic therapy pending culture results. She is to call if symptoms persist.  - ciprofloxacin (CIPRO) 500 MG tablet; Take 1 tablet (500 mg total) by mouth 2 (two) times daily.  Dispense: 14 tablet; Refill: 0  2. Dysuria See above medical treatment plan. - POCT urinalysis dipstick - Urine culture - NuSwab Vaginitis Plus (VG+)  3. Screen for STD (sexually transmitted disease) New partner. Will send Nuswab for testing. I will call with results once received.  - NuSwab Vaginitis Plus (VG+)       Margaretann LovelessJennifer M Burnette, PA-C  [facility]

## 2016-02-24 NOTE — Patient Instructions (Signed)

## 2016-02-26 LAB — URINE CULTURE

## 2016-02-28 LAB — NUSWAB VAGINITIS PLUS (VG+)
Atopobium vaginae: HIGH Score — AB
CANDIDA ALBICANS, NAA: POSITIVE — AB
CHLAMYDIA TRACHOMATIS, NAA: NEGATIVE
Candida glabrata, NAA: NEGATIVE
NEISSERIA GONORRHOEAE, NAA: NEGATIVE
Trich vag by NAA: NEGATIVE

## 2016-02-29 ENCOUNTER — Telehealth: Payer: Self-pay

## 2016-02-29 MED ORDER — FLUCONAZOLE 150 MG PO TABS: 150.0000 mg | ORAL_TABLET | Freq: Once | ORAL | Status: DC

## 2016-02-29 NOTE — Telephone Encounter (Signed)
Left message to call back  

## 2016-02-29 NOTE — Telephone Encounter (Signed)
-----   Message from Margaretann LovelessJennifer M Burnette, PA-C sent at 02/29/2016  9:16 AM EDT ----- Urine culture was positive for e.coli but sensitive to antibiotic used. Make sure antibiotic is completed. Also swab was only positive for yeast infection. All other testing on it was negative. Will send in diflucan to pharmacy on file.

## 2016-02-29 NOTE — Addendum Note (Signed)
Addended by: Margaretann LovelessBURNETTE, Caylan Schifano M on: 02/29/2016 09:17 AM   Modules accepted: Orders, SmartSet

## 2016-03-01 NOTE — Telephone Encounter (Signed)
LMTCB  Thanks,  -Deiondra Denley 

## 2016-03-01 NOTE — Telephone Encounter (Signed)
Pt returned your call.   ° °Thanks Cheryl Morgan  °

## 2016-03-02 NOTE — Telephone Encounter (Signed)
Patient advised as directed below. Patient verbalized understanding.  

## 2016-05-17 ENCOUNTER — Ambulatory Visit
Admission: RE | Admit: 2016-05-17 | Discharge: 2016-05-17 | Disposition: A | Discharge status: Home / Self Care | Payer: BLUE CROSS/BLUE SHIELD | Source: Ambulatory Visit | Attending: Physician Assistant | Admitting: Physician Assistant

## 2016-05-17 ENCOUNTER — Telehealth: Payer: Self-pay

## 2016-05-17 ENCOUNTER — Encounter: Payer: Self-pay | Admitting: Physician Assistant

## 2016-05-17 ENCOUNTER — Ambulatory Visit (INDEPENDENT_AMBULATORY_CARE_PROVIDER_SITE_OTHER): Payer: BLUE CROSS/BLUE SHIELD | Admitting: Physician Assistant

## 2016-05-17 VITALS — BP 120/70 | HR 70 | Temp 98.3°F | Resp 16 | Wt 178.6 lb

## 2016-05-17 DIAGNOSIS — R300 Dysuria: Secondary | ICD-10-CM

## 2016-05-17 DIAGNOSIS — M5442 Lumbago with sciatica, left side: Secondary | ICD-10-CM

## 2016-05-17 DIAGNOSIS — M5441 Lumbago with sciatica, right side: Secondary | ICD-10-CM

## 2016-05-17 DIAGNOSIS — M4180 Other forms of scoliosis, site unspecified: Secondary | ICD-10-CM | POA: Diagnosis not present

## 2016-05-17 DIAGNOSIS — Z3200 Encounter for pregnancy test, result unknown: Secondary | ICD-10-CM | POA: Diagnosis not present

## 2016-05-17 DIAGNOSIS — Z113 Encounter for screening for infections with a predominantly sexual mode of transmission: Secondary | ICD-10-CM

## 2016-05-17 DIAGNOSIS — N644 Mastodynia: Secondary | ICD-10-CM | POA: Diagnosis not present

## 2016-05-17 LAB — POCT URINALYSIS DIPSTICK
Bilirubin, UA: NEGATIVE
GLUCOSE UA: NEGATIVE
Ketones, UA: 5
LEUKOCYTES UA: NEGATIVE
NITRITE UA: NEGATIVE
PH UA: 6
Protein, UA: NEGATIVE
RBC UA: NEGATIVE
Spec Grav, UA: 1.02
UROBILINOGEN UA: 0.2

## 2016-05-17 LAB — POCT URINE PREGNANCY: PREG TEST UR: NEGATIVE

## 2016-05-17 NOTE — Patient Instructions (Signed)
Safe Sex  Safe sex is about reducing the risk of giving or getting a sexually transmitted disease (STD). STDs are spread through sexual contact involving the genitals, mouth, or rectum. Some STDs can be cured and others cannot. Safe sex can also prevent unintended pregnancies.   WHAT ARE SOME SAFE SEX PRACTICES?  · Limit your sexual activity to only one partner who is having sex with only you.  · Talk to your partner about his or her past partners, past STDs, and drug use.  · Use a condom every time you have sexual intercourse. This includes vaginal, oral, and anal sexual activity. Both females and males should wear condoms during oral sex. Only use latex or polyurethane condoms and water-based lubricants. Using petroleum-based lubricants or oils to lubricate a condom will weaken the condom and increase the chance that it will break. The condom should be in place from the beginning to the end of sexual activity. Wearing a condom reduces, but does not completely eliminate, your risk of getting or giving an STD. STDs can be spread by contact with infected body fluids and skin.  · Get vaccinated for hepatitis B and HPV.  · Avoid alcohol and recreational drugs, which can affect your judgment. You may forget to use a condom or participate in high-risk sex.  · For females, avoid douching after sexual intercourse. Douching can spread an infection farther into the reproductive tract.  · Check your body for signs of sores, blisters, rashes, or unusual discharge. See your health care provider if you notice any of these signs.  · Avoid sexual contact if you have symptoms of an infection or are being treated for an STD. If you or your partner has herpes, avoid sexual contact when blisters are present. Use condoms at all other times.  · If you are at risk of being infected with HIV, it is recommended that you take a prescription medicine daily to prevent HIV infection. This is called pre-exposure prophylaxis (PrEP). You are  considered at risk if:    You are a man who has sex with other men (MSM).    You are a heterosexual man or woman who is sexually active with more than one partner.    You take drugs by injection.    You are sexually active with a partner who has HIV.  · Talk with your health care provider about whether you are at high risk of being infected with HIV. If you choose to begin PrEP, you should first be tested for HIV. You should then be tested every 3 months for as long as you are taking PrEP.  · See your health care provider for regular screenings, exams, and tests for other STDs. Before having sex with a new partner, each of you should be screened for STDs and should talk about the results with each other.  WHAT ARE THE BENEFITS OF SAFE SEX?   · There is less chance of getting or giving an STD.  · You can prevent unwanted or unintended pregnancies.  · By discussing safe sex concerns with your partner, you may increase feelings of intimacy, comfort, trust, and honesty between the two of you.     This information is not intended to replace advice given to you by your health care provider. Make sure you discuss any questions you have with your health care provider.     Document Released: 11/10/2004 Document Revised: 10/24/2014 Document Reviewed: 03/26/2012  Elsevier Interactive Patient Education ©2016 [employer] Inc[employer]

## 2016-05-17 NOTE — Telephone Encounter (Signed)
Spoke with patient and advised as directed below. Per patient she is going to discuss with her parents and will give a call back.  Thanks,  -Tye Juarez R.

## 2016-05-17 NOTE — Progress Notes (Signed)
Patient: Cheryl Morgan Female    DOB: 11-26-1995   19 y.o.   MRN: [id number] Visit Date: 05/17/2016  Today's Provider: Margaretann Loveless, PA-C   Chief Complaint  Patient presents with  . Urinary Tract Infection   Subjective:    Urinary Tract Infection   The current episode started 1 to 4 weeks ago. The problem occurs intermittently. The problem has been gradually worsening. The quality of the pain is described as burning and aching. The pain is moderate. There has been no fever. Associated symptoms include chills (for the past two days) and nausea. Pertinent negatives include no discharge, flank pain, frequency, hematuria, possible pregnancy, urgency or vomiting. Associated symptoms comments: Lower back pain.. She has tried nothing for the symptoms. Her past medical history is significant for recurrent UTIs.   She has been having lower pelvic discomfort since having her IUD inserted. She denies any significant pain or abnormal bleeding.   She would also like to be tested for STDs. Her most recent partner was 2 months ago and they did have unprotected intercourse. Following their break-up she found out he had been diagnosed with herpes.   She also would like to have a pregnancy test secondary to having increased breast tenderness over the last week.  She complains of low back pain as well that will radiate into her thighs to the knees bilaterally. She states the left side is worse and radiates down left leg more frequently, but that right side will also have similar symptoms.    No Known Allergies Current Meds  Medication Sig  . Levonorgestrel (SKYLA) 13.5 MG IUD by Intrauterine route.    Review of Systems  Constitutional: Positive for chills (for the past two days). Negative for fatigue and fever.  HENT: Negative.   Eyes: Negative.   Respiratory: Negative.   Cardiovascular: Negative.   Gastrointestinal: Positive for nausea. Negative for vomiting.  Genitourinary:  Positive for dysuria and pelvic pain (pressure; not pain). Negative for dyspareunia, flank pain, frequency, hematuria, menstrual problem, urgency, vaginal bleeding, vaginal discharge and vaginal pain.  Musculoskeletal: Positive for back pain and myalgias. Negative for arthralgias, gait problem, joint swelling and neck pain.  Neurological: Negative for dizziness and headaches.  Psychiatric/Behavioral: Negative.     Social History  Substance Use Topics  . Smoking status: Never Smoker  . Smokeless tobacco: Never Used  . Alcohol use Yes     Comment: occassionally   Objective:   BP 120/70 (BP Location: Left Arm, Patient Position: Sitting, Cuff Size: Normal)   Pulse 70   Temp 98.3 F (36.8 C) (Oral)   Resp 16   Wt 178 lb 9.6 oz (81 kg)   BMI 27.97 kg/m   Physical Exam  Constitutional: She is oriented to person, place, and time. She appears well-developed and well-nourished. No distress.  Cardiovascular: Normal rate, regular rhythm and normal heart sounds.  Exam reveals no gallop and no friction rub.   No murmur heard. Pulmonary/Chest: Effort normal and breath sounds normal. No respiratory distress. She has no wheezes. She has no rales.  Abdominal: Soft. Normal appearance and bowel sounds are normal. She exhibits no distension and no mass. There is no hepatosplenomegaly. There is tenderness in the suprapubic area. There is no rebound, no guarding and no CVA tenderness.  Suprapubic pressure  Musculoskeletal:       Lumbar back: She exhibits tenderness. She exhibits normal range of motion, no bony tenderness, no deformity, no pain and no  spasm.  B lower extremity strength 5/5 Neg SLR  Neurological: She is alert and oriented to person, place, and time.  Skin: Skin is warm and dry. She is not diaphoretic.  Vitals reviewed.     Assessment & Plan:     1. Dysuria UA was negative for UTI but with history of recurrent UTI I will send for culture. Will f/u pending results. - POCT urinalysis  dipstick - Urine culture  2. Breast tenderness in female Urine pregnancy negative. - POCT urine pregnancy  3. Possible pregnancy, not yet confirmed Urine pregnancy negative. - POCT urine pregnancy  4. Screen for STD (sexually transmitted disease) Will test for STD as below. Will f/u pending results. - NuSwab Vaginitis Plus (VG+) - HSV(herpes simplex vrs) 1+2 ab-IgM - RPR - HIV antibody (with reflex)  5. Bilateral low back pain with sciatica, sciatica laterality unspecified Will obtain xray to r/o structural cause of back pain.  - DG Lumbar Spine Complete; Future   Xray results:     Study Result   CLINICAL DATA:  Pt states she has aching and burning in both legs for 1 month. Also pain across lower back. No injury.  EXAM: LUMBAR SPINE - COMPLETE 4+ VIEW  COMPARISON:  None.  FINDINGS: No fracture.  No spondylolisthesis.  No bone lesion.  Mild dextroscoliosis, apex at L3.  No degenerative change.  Soft tissues are unremarkable.  IMPRESSION: 1. No fracture or acute finding.  No spondylolisthesis. 2. Mild dextroscoliosis.  No other abnormalities.   Electronically Signed   By: Amie Portland M.D.   On: 05/17/2016 14:14   *Called patient to inform of results and offered PT to help with back pain and mild scoliosis. Will order if patient desires.    Margaretann Loveless, PA-C  [facility]

## 2016-05-17 NOTE — Telephone Encounter (Signed)
-----   Message from Margaretann Loveless, [place] sent at 05/17/2016  2:21 PM EDT ----- Cheryl Morgan shows mild scoliosis with curvature to the right at L3. As discussed in the office PT may benefit her the most. If wanted will place order.

## 2016-05-18 LAB — URINE CULTURE

## 2016-05-19 ENCOUNTER — Ambulatory Visit (INDEPENDENT_AMBULATORY_CARE_PROVIDER_SITE_OTHER): Payer: BLUE CROSS/BLUE SHIELD | Admitting: Physician Assistant

## 2016-05-19 ENCOUNTER — Encounter: Payer: Self-pay | Admitting: Physician Assistant

## 2016-05-19 VITALS — BP 118/70 | HR 100 | Temp 98.0°F | Resp 16

## 2016-05-19 DIAGNOSIS — K5900 Constipation, unspecified: Secondary | ICD-10-CM

## 2016-05-19 DIAGNOSIS — R110 Nausea: Secondary | ICD-10-CM

## 2016-05-19 LAB — HIV ANTIBODY (ROUTINE TESTING W REFLEX): HIV Screen 4th Generation wRfx: NONREACTIVE

## 2016-05-19 LAB — HSV(HERPES SIMPLEX VRS) I + II AB-IGM: HSVI/II Comb IgM: 2.21 Ratio — ABNORMAL HIGH (ref 0.00–0.90)

## 2016-05-19 LAB — RPR: RPR Ser Ql: NONREACTIVE

## 2016-05-19 MED ORDER — ONDANSETRON 4 MG PO TBDP: 4.0000 mg | ORAL_TABLET | Freq: Once | ORAL | Status: DC

## 2016-05-19 MED ORDER — POLYETHYLENE GLYCOL 3350 17 GM/SCOOP PO POWD: 17.0000 g | Freq: Two times a day (BID) | ORAL | 1 refills | Status: DC | PRN

## 2016-05-19 NOTE — Progress Notes (Signed)
Patient: Cheryl Morgan Female    DOB: 1996-02-05   20 y.o.   MRN: [id number] Visit Date: 05/19/2016  Today's Provider: Margaretann Loveless, PA-C   Chief Complaint  Patient presents with  . Constipation   Subjective:    HPI Constipation: Patient complains of constipation. She reports that she usually has a stool 2-3 a week. Onset was 3 days ago. She reports that she feels really bloated,unable to digest food pretty well. She has not had a stool since Monday. She reports she felt she had to have a BM on Tuesday, strained and only got out one small stool. She also feels nauseous. She started feeling nauseous on Tuesday but thought it was what she may have eaten. It did not go away and is why she returns today. In general she reports her diet is well balanced. She was seen Tuesday but did not mention issue because of thinking it was something she had eaten that day.     No Known Allergies Current Meds  Medication Sig  . albuterol (PROAIR HFA) 108 (90 BASE) MCG/ACT inhaler Inhale into the lungs.  . Levonorgestrel (SKYLA) 13.5 MG IUD by Intrauterine route.    Review of Systems  Constitutional: Negative.   Respiratory: Negative for cough, chest tightness and shortness of breath.   Cardiovascular: Negative for chest pain, palpitations and leg swelling.  Gastrointestinal: Positive for abdominal distention, abdominal pain, constipation and nausea. Negative for diarrhea, rectal pain and vomiting.  Genitourinary: Negative for dysuria, flank pain, frequency, genital sores, hematuria, menstrual problem, pelvic pain, urgency, vaginal bleeding, vaginal discharge and vaginal pain.  Musculoskeletal: Negative for back pain.  Neurological: Negative for dizziness and headaches.    Social History  Substance Use Topics  . Smoking status: Never Smoker  . Smokeless tobacco: Never Used  . Alcohol use Yes     Comment: occassionally   Objective:   BP 118/70 (BP Location: Left Arm, Patient  Position: Sitting, Cuff Size: Normal)   Pulse 100   Temp 98 F (36.7 C) (Oral)   Resp 16   Physical Exam  Constitutional: She is oriented to person, place, and time. She appears well-developed and well-nourished. No distress.  Cardiovascular: Normal rate, regular rhythm and normal heart sounds.  Exam reveals no gallop and no friction rub.   No murmur heard. Pulmonary/Chest: Effort normal and breath sounds normal. No respiratory distress. She has no wheezes. She has no rales.  Abdominal: Soft. Normal appearance and bowel sounds are normal. She exhibits no distension and no mass. There is no hepatosplenomegaly. There is generalized tenderness ("a lot of pressure"). There is no rebound, no guarding and no CVA tenderness.  Neurological: She is alert and oriented to person, place, and time.  Skin: Skin is warm and dry. She is not diaphoretic.  Vitals reviewed.      Assessment & Plan:     1. Constipation, unspecified constipation type Advised to increase water intake, add miralax 1-2 capfuls daily until loose than back down to as needed. Call if no improvements, starts vomiting, abdominal pain worsens or develops fevers.  - polyethylene glycol powder (GLYCOLAX/MIRALAX) powder; Take 17 g by mouth 2 (two) times daily as needed.  Dispense: 3350 g; Refill: 1  2. Nausea Zofran given for nausea for use as needed.  - ondansetron (ZOFRAN-ODT) disintegrating tablet 4 mg; Take 1 tablet (4 mg total) by mouth once.       Margaretann Loveless, PA-C  [facility]  [place]

## 2016-05-19 NOTE — Patient Instructions (Signed)

## 2016-05-21 LAB — SPECIMEN STATUS REPORT

## 2016-05-21 LAB — HERPES SIMPLEX VIRUS(HSV) DNA BY PCR
HSV 2 DNA: NEGATIVE
HSV-1 DNA: NEGATIVE

## 2016-05-23 ENCOUNTER — Telehealth: Payer: Self-pay

## 2016-05-23 LAB — NUSWAB VAGINITIS PLUS (VG+)
CANDIDA ALBICANS, NAA: NEGATIVE
Candida glabrata, NAA: NEGATIVE
Chlamydia trachomatis, NAA: NEGATIVE
NEISSERIA GONORRHOEAE, NAA: NEGATIVE
Trich vag by NAA: NEGATIVE

## 2016-05-23 NOTE — Telephone Encounter (Signed)
Patient advised as directed below.  Thanks,  -Neasia Fleeman 

## 2016-05-23 NOTE — Telephone Encounter (Signed)
-----   Message from Margaretann LovelessJennifer M Burnette, PA-C sent at 05/23/2016  1:40 PM EDT ----- All testing was negative.

## 2016-08-29 ENCOUNTER — Encounter: Payer: Self-pay | Admitting: Family Medicine

## 2016-08-29 ENCOUNTER — Ambulatory Visit (INDEPENDENT_AMBULATORY_CARE_PROVIDER_SITE_OTHER): Payer: BLUE CROSS/BLUE SHIELD | Admitting: Family Medicine

## 2016-08-29 VITALS — BP 102/78 | HR 61 | Temp 98.3°F | Resp 14 | Wt 180.2 lb

## 2016-08-29 DIAGNOSIS — R300 Dysuria: Secondary | ICD-10-CM | POA: Diagnosis not present

## 2016-08-29 DIAGNOSIS — N3001 Acute cystitis with hematuria: Secondary | ICD-10-CM | POA: Diagnosis not present

## 2016-08-29 LAB — POCT URINALYSIS DIPSTICK
Bilirubin, UA: NEGATIVE
Glucose, UA: NEGATIVE
Ketones, UA: NEGATIVE
Nitrite, UA: NEGATIVE
Spec Grav, UA: 1.01
UROBILINOGEN UA: 0.2
pH, UA: 7.5

## 2016-08-29 MED ORDER — CIPROFLOXACIN HCL 500 MG PO TABS: 500.0000 mg | ORAL_TABLET | Freq: Two times a day (BID) | ORAL | 0 refills | Status: DC

## 2016-08-29 NOTE — Progress Notes (Signed)
Patient: Cheryl Morgan Female    DOB: 12/05/1995   19 y.o.   MRN: [id number] Visit Date: 08/29/2016  Today's Provider: Dortha Kernennis Sabastien Tyler, PA   Chief Complaint  Patient presents with  . Urinary Tract Infection   Subjective:    Urinary Tract Infection   This is a new problem. Episode onset: Thursday. The problem occurs intermittently. The problem has been unchanged. The quality of the pain is described as burning (pressure). Associated symptoms comments: Decreased urine output . Treatments tried: Azo. The treatment provided moderate relief.   Patient Active Problem List   Diagnosis Date Noted  . IUD (intrauterine device) in place 10/26/2015  . 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 Weight gain 08/01/2012  . Dysmenorrhea 08/01/2012  . Menorrhagia 08/01/2012  . Excessive and frequent menstruation 08/01/2012   Past Surgical History:  Procedure Laterality Date  . MOUTH SURGERY    . 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 Hypertension Maternal Aunt   . Diabetes Maternal Grandmother   . Hypertension Maternal Grandmother   . Depression Maternal Grandmother    No Known Allergies   Previous Medications   LEVONORGESTREL (SKYLA) 13.5 MG IUD    by Intrauterine route.    Review of Systems  Constitutional: Negative.   Respiratory: Negative.   Cardiovascular: Negative.   Genitourinary: Positive for decreased urine volume and dysuria.    Social History  Substance Use Topics  . Smoking status: Never Smoker  . Smokeless tobacco: Never Used  . Alcohol use Yes     Comment: occassionally   Objective:   BP 102/78 (BP Location: Right Arm, Patient Position: Sitting, Cuff Size: Normal)   Pulse 61   Temp 98.3 F (36.8 C) (Oral)   Resp 14   Wt 180 lb 3.2 oz (81.7 kg)   BMI 28.22 kg/m   Physical Exam  Constitutional: She is oriented to person, place, and time. She appears well-developed and  well-nourished. No distress.  HENT:  Head: Normocephalic and atraumatic.  Right Ear: Hearing normal.  Left Ear: Hearing normal.  Nose: Nose normal.  Mouth/Throat: Oropharynx is clear and moist.  Very large tonsils without erythema or exudates.   Eyes: Conjunctivae and lids are normal. Right eye exhibits no discharge. Left eye exhibits no discharge. No scleral icterus.  Neck: Neck supple.  Cardiovascular: Normal rate and regular rhythm.   Pulmonary/Chest: Effort normal and breath sounds normal. No respiratory distress.  Abdominal: Soft. Bowel sounds are normal. There is tenderness.  Suprapubic tenderness without masses or organomegaly. No CVA tenderness to percussion posteriorly.  Musculoskeletal: Normal range of motion.  Lymphadenopathy:    She has no cervical adenopathy.  Neurological: She is alert and oriented to person, place, and time.  Skin: Skin is intact. No lesion and no rash noted.  Psychiatric: She has a normal mood and affect. Her speech is normal and behavior is normal. Thought content normal.      Assessment & Plan:     1. Dysuria Onset over the past 3 days. Has had similar episodes 3-4 times this year. Urinalysis indicates acute cystitis. Will get C&S and start antibiotic treatment.  - POCT Urinalysis Dipstick - Urine culture  2. Acute cystitis with hematuria Pyuria with bacteriuria on urine microscopic exam. Had two cultures positive for E. Coli twice in the spring of 2017. Similar dysuria in August 2017. Will get urine C&S and start Cipro (  last used on E. Coli). Recheck pending reports to culture for cure. May need evaluation by urologist if persistent and recurring. - Urine culture - ciprofloxacin (CIPRO) 500 MG tablet; Take 1 tablet (500 mg total) by mouth 2 (two) times daily.  Dispense: 20 tablet; Refill: 0

## 2016-08-31 LAB — URINE CULTURE

## 2016-09-02 ENCOUNTER — Telehealth: Payer: Self-pay

## 2016-09-02 NOTE — Telephone Encounter (Signed)
-----   Message from Jodell Ciproennis E Round Lakehrismon, [place] sent at 09/02/2016  3:39 PM EST ----- Urine culture isolated a staphylococcus saprophyticus bacteria that is susceptible to the Ciprofloxacin given. Take all the antibiotic and recheck as planned on 09-13-16. Drink extra water to flush urinary tract.

## 2016-09-02 NOTE — Telephone Encounter (Signed)
LMTCB

## 2016-09-05 NOTE — Telephone Encounter (Signed)
Patient was advised. KW 

## 2016-09-13 ENCOUNTER — Ambulatory Visit (INDEPENDENT_AMBULATORY_CARE_PROVIDER_SITE_OTHER): Payer: BLUE CROSS/BLUE SHIELD | Admitting: Family Medicine

## 2016-09-13 ENCOUNTER — Encounter: Payer: Self-pay | Admitting: Family Medicine

## 2016-09-13 VITALS — BP 102/64 | HR 76 | Temp 98.3°F | Resp 16 | Wt 180.8 lb

## 2016-09-13 DIAGNOSIS — N3001 Acute cystitis with hematuria: Secondary | ICD-10-CM | POA: Diagnosis not present

## 2016-09-13 LAB — POCT URINALYSIS DIPSTICK
Bilirubin, UA: NEGATIVE
Glucose, UA: NEGATIVE
KETONES UA: NEGATIVE
Leukocytes, UA: NEGATIVE
Nitrite, UA: NEGATIVE
PH UA: 6
PROTEIN UA: NEGATIVE
RBC UA: NEGATIVE
SPEC GRAV UA: 1.02
Urobilinogen, UA: 0.2

## 2016-09-13 NOTE — Progress Notes (Signed)
   Patient: Cheryl Morgan Female    DOB: 11/24/1995   20 y.o.   MRN: [id number] Visit Date: 09/13/2016  Today's Provider: Dortha Kernennis Jaryah Aracena, PA   Chief Complaint  Patient presents with  . Urinary Tract Infection  . Follow-up   Subjective:    HPI Patient is here to follow up from OV on 08/29/2016. Patient was diagnosed with acute cystitis with hematuria. Urine culture showed staphylococcus saprophyticus bacteria. She was prescribed Cipro and advised to push fluids and follow up in 10 days. Patient reports good compliance with treatment plan. Symptoms have improved but still having decreased output at times.   Patient Active Problem List   Diagnosis Date Noted  . IUD (intrauterine device) in place 10/26/2015  . 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 Weight gain 08/01/2012  . Dysmenorrhea 08/01/2012  . Menorrhagia 08/01/2012  . Excessive and frequent menstruation 08/01/2012   Past Surgical History:  Procedure Laterality Date  . MOUTH SURGERY    . 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 Hypertension Maternal Aunt   . Diabetes Maternal Grandmother   . Hypertension Maternal Grandmother   . Depression Maternal Grandmother    No Known Allergies   Previous Medications   LEVONORGESTREL (SKYLA) 13.5 MG IUD    by Intrauterine route.    Review of Systems  Constitutional: Negative.   Respiratory: Negative.   Cardiovascular: Negative.   Genitourinary: Positive for decreased urine volume.    Social History  Substance Use Topics  . Smoking status: Never Smoker  . Smokeless tobacco: Never Used  . Alcohol use Yes     Comment: occassionally   Objective:   BP 102/64 (BP Location: Right Arm, Cuff Size: Normal)   Pulse 76   Temp 98.3 F (36.8 C) (Oral)   Resp 16   Wt 180 lb 12.8 oz (82 kg)   BMI 28.32 kg/m   Physical Exam  Constitutional: She is oriented to person, place, and time. She  appears well-developed and well-nourished. No distress.  HENT:  Head: Normocephalic and atraumatic.  Right Ear: Hearing normal.  Left Ear: Hearing normal.  Nose: Nose normal.  Eyes: Conjunctivae and lids are normal. Right eye exhibits no discharge. Left eye exhibits no discharge. No scleral icterus.  Cardiovascular: Normal rate and regular rhythm.   Pulmonary/Chest: Effort normal and breath sounds normal. No respiratory distress.  Abdominal: Soft. Bowel sounds are normal.  Musculoskeletal: Normal range of motion.  Neurological: She is alert and oriented to person, place, and time.  Skin: Skin is intact. No lesion and no rash noted.  Psychiatric: She has a normal mood and affect. Her speech is normal and behavior is normal. Thought content normal.      Assessment & Plan:     1. Acute cystitis with hematuria Culture isolated staphylococcus saprophyticus bacteria as the cause of her cystitis 14 days ago. Finished the Cipro and no longer having dysuria or frequency. Not drinking much water. Urinalysis normal today. Encouraged to increase water intake and recheck as needed.  - POCT Urinalysis Dipstick

## 2016-09-28 ENCOUNTER — Encounter: Payer: Self-pay | Admitting: Physician Assistant

## 2016-09-28 ENCOUNTER — Ambulatory Visit (INDEPENDENT_AMBULATORY_CARE_PROVIDER_SITE_OTHER): Payer: BLUE CROSS/BLUE SHIELD | Admitting: Physician Assistant

## 2016-09-28 VITALS — BP 96/64 | HR 80 | Temp 98.3°F | Resp 16 | Wt 179.0 lb

## 2016-09-28 DIAGNOSIS — J029 Acute pharyngitis, unspecified: Secondary | ICD-10-CM

## 2016-09-28 DIAGNOSIS — H66003 Acute suppurative otitis media without spontaneous rupture of ear drum, bilateral: Secondary | ICD-10-CM | POA: Diagnosis not present

## 2016-09-28 DIAGNOSIS — J020 Streptococcal pharyngitis: Secondary | ICD-10-CM | POA: Diagnosis not present

## 2016-09-28 LAB — POCT RAPID STREP A (OFFICE): Rapid Strep A Screen: NEGATIVE

## 2016-09-28 MED ORDER — AMOXICILLIN 875 MG PO TABS: 875.0000 mg | ORAL_TABLET | Freq: Two times a day (BID) | ORAL | 0 refills | Status: DC

## 2016-09-28 NOTE — Progress Notes (Signed)
Patient: Cheryl Morgan Female    DOB: 02/06/1996   20 y.o.   MRN: [id number] Visit Date: 09/28/2016  Today's Provider: Margaretann LovelessJennifer M Burnette, PA-C   Chief Complaint  Patient presents with  . Sore Throat   Subjective:    Sore Throat   This is a new problem. The current episode started today. The problem has been gradually worsening. The pain is worse on the left side. There has been no fever. The pain is at a severity of 4/10. The pain is moderate. Associated symptoms include congestion, coughing (productive with yellow sputum), ear pain (left), headaches, a hoarse voice, a plugged ear sensation, neck pain, shortness of breath, swollen glands and trouble swallowing. Pertinent negatives include no abdominal pain, ear discharge or vomiting. Associated symptoms comments: Pt also c/o fatigue and left eye swelling/redness last night. Eye is improving.. 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 She has tried nothing for the symptoms.      No Known Allergies   Current Outpatient Prescriptions:  .  buPROPion (WELLBUTRIN) 75 MG tablet, Take 75 mg by mouth 2 (two) times daily., Disp: , Rfl:  .  Levonorgestrel (SKYLA) 13.5 MG IUD, by Intrauterine route., Disp: , Rfl:   Current Facility-Administered Medications:  .  ondansetron (ZOFRAN-ODT) disintegrating tablet 4 mg, 4 mg, Oral, Once, Margaretann LovelessJennifer M Burnette, PA-C  Review of Systems  Constitutional: Positive for fatigue. Negative for chills and fever.  HENT: Positive for congestion, ear pain (left), hoarse voice, sore throat and trouble swallowing. Negative for ear discharge, postnasal drip, rhinorrhea, sinus pain, sinus pressure, sneezing and tinnitus.   Eyes: Positive for redness. Negative for photophobia, pain, discharge, itching and visual disturbance.  Respiratory: Positive for cough (productive with yellow sputum) and shortness of breath. Negative for chest tightness and wheezing.   Cardiovascular:  Negative for chest pain, palpitations and leg swelling.  Gastrointestinal: Negative for abdominal pain and vomiting.  Musculoskeletal: Positive for neck pain.  Neurological: Positive for headaches. Negative for dizziness.    Social History  Substance Use Topics  . Smoking status: Never Smoker  . Smokeless tobacco: Never Used  . Alcohol use Yes     Comment: occassionally   Objective:   BP 96/64 (BP Location: Left Arm, Patient Position: Sitting, Cuff Size: Large)   Pulse 80   Temp 98.3 F (36.8 C) (Oral)   Resp 16   Wt 179 lb (81.2 kg)   BMI 28.04 kg/m   Physical Exam  Constitutional: She appears well-developed and well-nourished. No distress.  HENT:  Head: Normocephalic and atraumatic.  Right Ear: Hearing, external ear and ear canal normal. Tympanic membrane is erythematous and bulging. A middle ear effusion (bottom half filled with supparative effusion) is present.  Left Ear: Hearing, external ear and ear canal normal. Tympanic membrane is erythematous and bulging. A middle ear effusion is present.  Nose: Mucosal edema present. No rhinorrhea. Right sinus exhibits no maxillary sinus tenderness and no frontal sinus tenderness. Left sinus exhibits no maxillary sinus tenderness and no frontal sinus tenderness.  Mouth/Throat: Uvula is midline and mucous membranes are normal. Oropharyngeal exudate (left side), posterior oropharyngeal edema and posterior oropharyngeal erythema present.  Eyes: Conjunctivae are normal. Pupils are equal, round, and reactive to light. Right eye exhibits no discharge. Left eye exhibits no discharge. No scleral icterus.  Neck: Normal range of motion. Neck supple. No tracheal deviation present. No thyromegaly present.  Cardiovascular: Normal rate,  regular rhythm and normal heart sounds.  Exam reveals no gallop and no friction rub.   No murmur heard. Pulmonary/Chest: Effort normal and breath sounds normal. No stridor. No respiratory distress. She has no wheezes.  She has no rales.  Lymphadenopathy:    She has no cervical adenopathy.  Skin: Skin is warm and dry. She is not diaphoretic.  Vitals reviewed.     Assessment & Plan:     1. Strep pharyngitis Rapid strep was negative but patient does have history of strep throat and with physical exam with exudate. Especially on the left side I will treat her for strep as below with amoxicillin. Advised that she may use Tylenol or ibuprofen for fevers or pain. She may also use Chloraseptic spray to help her with swallowing. Salt water gargles may also benefit her. She is to call the office if symptoms fail to improve or worsen. - amoxicillin (AMOXIL) 875 MG tablet; Take 1 tablet (875 mg total) by mouth 2 (two) times daily.  Dispense: 20 tablet; Refill: 0  2. Acute suppurative otitis media of both ears without spontaneous rupture of tympanic membranes, recurrence not specified Noted bilaterally with the left worse than the right. Will treat with amoxicillin as below. She is to call the office if symptoms fail to improve or worsen. - amoxicillin (AMOXIL) 875 MG tablet; Take 1 tablet (875 mg total) by mouth 2 (two) times daily.  Dispense: 20 tablet; Refill: 0  3. Sore throat Rapid strep was negative. - POCT rapid strep A     Patient seen and examined by Margaretann LovelessJennifer M Burnette, PA-C, and note scribed by Allene DillonEmily Drozdowski, CMA.   Margaretann LovelessJennifer M Burnette, PA-C  [facility]

## 2016-09-28 NOTE — Patient Instructions (Signed)
Otitis Media, Adult Otitis media is redness, soreness, and inflammation of the middle ear. Otitis media may be caused by allergies or, most commonly, by infection. Often it occurs as a complication of the common cold. What are the signs or symptoms? Symptoms of otitis media may include:  Earache.  Fever.  Ringing in your ear.  Headache.  Leakage of fluid from the ear. How is this diagnosed? To diagnose otitis media, your health care provider will examine your ear with an otoscope. This is an instrument that allows your health care provider to see into your ear in order to examine your eardrum. Your health care provider also will ask you questions about your symptoms. How is this treated? Typically, otitis media resolves on its own within 3-5 days. Your health care provider may prescribe medicine to ease your symptoms of pain. If otitis media does not resolve within 5 days or is recurrent, your health care provider may prescribe antibiotic medicines if he or she suspects that a bacterial infection is the cause. Follow these instructions at home:  If you were prescribed an antibiotic medicine, finish it all even if you start to feel better.  Take medicines only as directed by your health care provider.  Keep all follow-up visits as directed by your health care provider. Contact a health care provider if:  You have otitis media only in one ear, or bleeding from your nose, or both.  You notice a lump on your neck.  You are not getting better in 3-5 days.  You feel worse instead of better. Get help right away if:  You have pain that is not controlled with medicine.  You have swelling, redness, or pain around your ear or stiffness in your neck.  You notice that part of your face is paralyzed.  You notice that the bone behind your ear (mastoid) is tender when you touch it. This information is not intended to replace advice given to you by your health care provider. Make sure you  discuss any questions you have with your health care provider. Document Released: 07/08/2004 Document Revised: 03/10/2016 Document Reviewed: 04/30/2013 Elsevier Interactive Patient Education  2017 [employer] Inc. Strep Throat Strep throat is a bacterial infection of the throat. Your health care provider may call the infection tonsillitis or pharyngitis, depending on whether there is swelling in the tonsils or at the back of the throat. Strep throat is most common during the cold months of the year in children who are 515-21 years of age, but it can happen during any season in people of any age. This infection is spread from person to person (contagious) through coughing, sneezing, or close contact. What are the causes? Strep throat is caused by the bacteria called Streptococcus pyogenes. What increases the risk? This condition is more likely to develop in:  People who spend time in crowded places where the infection can spread easily.  People who have close contact with someone who has strep throat. What are the signs or symptoms? Symptoms of this condition include:  Fever or chills.  Redness, swelling, or pain in the tonsils or throat.  Pain or difficulty when swallowing.  White or yellow spots on the tonsils or throat.  Swollen, tender glands in the neck or under the jaw.  Red rash all over the body (rare). How is this diagnosed? This condition is diagnosed by performing a rapid strep test or by taking a swab of your throat (throat culture test). Results from a rapid strep test  are usually ready in a few minutes, but throat culture test results are available after one or two days. How is this treated? This condition is treated with antibiotic medicine. Follow these instructions at home: Medicines  Take over-the-counter and prescription medicines only as told by your health care provider.  Take your antibiotic as told by your health care provider. Do not stop taking the antibiotic  even if you start to feel better.  Have family members who also have a sore throat or fever tested for strep throat. They may need antibiotics if they have the strep infection. Eating and drinking  Do not share food, drinking cups, or personal items that could cause the infection to spread to other people.  If swallowing is difficult, try eating soft foods until your sore throat feels better.  Drink enough fluid to keep your urine clear or pale yellow. General instructions  Gargle with a salt-water mixture 3-4 times per day or as needed. To make a salt-water mixture, completely dissolve -1 tsp of salt in 1 cup of warm water.  Make sure that all household members wash their hands well.  Get plenty of rest.  Stay home from school or work until you have been taking antibiotics for 24 hours.  Keep all follow-up visits as told by your health care provider. This is important. Contact a health care provider if:  The glands in your neck continue to get bigger.  You develop a rash, cough, or earache.  You cough up a thick liquid that is green, yellow-brown, or bloody.  You have pain or discomfort that does not get better with medicine.  Your problems seem to be getting worse rather than better.  You have a fever. Get help right away if:  You have new symptoms, such as vomiting, severe headache, stiff or painful neck, chest pain, or shortness of breath.  You have severe throat pain, drooling, or changes in your voice.  You have swelling of the neck, or the skin on the neck becomes red and tender.  You have signs of dehydration, such as fatigue, dry mouth, and decreased urination.  You become increasingly sleepy, or you cannot wake up completely.  Your joints become red or painful. This information is not intended to replace advice given to you by your health care provider. Make sure you discuss any questions you have with your health care provider. Document Released: 09/30/2000  Document Revised: 06/01/2016 Document Reviewed: 01/26/2015 Elsevier Interactive Patient Education  2017 [employer]

## 2016-12-01 ENCOUNTER — Encounter: Payer: Self-pay | Admitting: Physician Assistant

## 2016-12-01 ENCOUNTER — Ambulatory Visit (INDEPENDENT_AMBULATORY_CARE_PROVIDER_SITE_OTHER): Payer: BLUE CROSS/BLUE SHIELD | Admitting: Physician Assistant

## 2016-12-01 VITALS — BP 100/68 | HR 60 | Temp 98.2°F | Resp 16 | Wt 180.0 lb

## 2016-12-01 DIAGNOSIS — B379 Candidiasis, unspecified: Secondary | ICD-10-CM

## 2016-12-01 DIAGNOSIS — B9689 Other specified bacterial agents as the cause of diseases classified elsewhere: Secondary | ICD-10-CM

## 2016-12-01 DIAGNOSIS — N898 Other specified noninflammatory disorders of vagina: Secondary | ICD-10-CM | POA: Diagnosis not present

## 2016-12-01 DIAGNOSIS — N760 Acute vaginitis: Secondary | ICD-10-CM | POA: Diagnosis not present

## 2016-12-01 LAB — POCT URINALYSIS DIPSTICK
BILIRUBIN UA: NEGATIVE
GLUCOSE UA: NEGATIVE
KETONES UA: NEGATIVE
Leukocytes, UA: NEGATIVE
Nitrite, UA: NEGATIVE
Protein, UA: NEGATIVE
RBC UA: NEGATIVE
SPEC GRAV UA: 1.01
Urobilinogen, UA: 0.2
pH, UA: 7

## 2016-12-01 MED ORDER — FLUCONAZOLE 150 MG PO TABS: 150.0000 mg | ORAL_TABLET | Freq: Once | ORAL | 0 refills | Status: AC

## 2016-12-01 MED ORDER — METRONIDAZOLE 500 MG PO TABS: 500.0000 mg | ORAL_TABLET | Freq: Two times a day (BID) | ORAL | 0 refills | Status: DC

## 2016-12-01 NOTE — Patient Instructions (Signed)
Vaginal Yeast infection, Adult Vaginal yeast infection is a condition that causes soreness, swelling, and redness (inflammation) of the vagina. It also causes vaginal discharge. This is a common condition. Some women get this infection frequently. What are the causes? This condition is caused by a change in the normal balance of the yeast (candida) and bacteria that live in the vagina. This change causes an overgrowth of yeast, which causes the inflammation. What increases the risk? This condition is more likely to develop in:  Women who take antibiotic medicines.  Women who have diabetes.  Women who take birth control pills.  Women who are pregnant.  Women who douche often.  Women who have a weak defense (immune) system.  Women who have been taking steroid medicines for a long time.  Women who frequently wear tight clothing. What are the signs or symptoms? Symptoms of this condition include:  White, thick vaginal discharge.  Swelling, itching, redness, and irritation of the vagina. The lips of the vagina (vulva) may be affected as well.  Pain or a burning feeling while urinating.  Pain during sex. How is this diagnosed? This condition is diagnosed with a medical history and physical exam. This will include a pelvic exam. Your health care provider will examine a sample of your vaginal discharge under a microscope. Your health care provider may send this sample for testing to confirm the diagnosis. How is this treated? This condition is treated with medicine. Medicines may be over-the-counter or prescription. You may be told to use one or more of the following:  Medicine that is taken orally.  Medicine that is applied as a cream.  Medicine that is inserted directly into the vagina (suppository). Follow these instructions at home:  Take or apply over-the-counter and prescription medicines only as told by your health care provider.  Do not have sex until your health care  provider has approved. Tell your sex partner that you have a yeast infection. That person should go to his or her health care provider if he or she develops symptoms.  Do not wear tight clothes, such as pantyhose or tight pants.  Avoid using tampons until your health care provider approves.  Eat more yogurt. This may help to keep your yeast infection from returning.  Try taking a sitz bath to help with discomfort. This is a warm water bath that is taken while you are sitting down. The water should only come up to your hips and should cover your buttocks. Do this 3-4 times per day or as told by your health care provider.  Do not douche.  Wear breathable, cotton underwear.  If you have diabetes, keep your blood sugar levels under control. Contact a health care provider if:  You have a fever.  Your symptoms go away and then return.  Your symptoms do not get better with treatment.  Your symptoms get worse.  You have new symptoms.  You develop blisters in or around your vagina.  You have blood coming from your vagina and it is not your menstrual period.  You develop pain in your abdomen. This information is not intended to replace advice given to you by your health care provider. Make sure you discuss any questions you have with your health care provider. Document Released: 07/13/2005 Document Revised: 03/16/2016 Document Reviewed: 04/06/2015 Elsevier Interactive Patient Education  2017 Elsevier Inc.   Bacterial Vaginosis Bacterial vaginosis is a vaginal infection that occurs when the normal balance of bacteria in the vagina is disrupted. It   results from an overgrowth of certain bacteria. This is the most common vaginal infection among women ages 15-44. Because bacterial vaginosis increases your risk for STIs (sexually transmitted infections), getting treated can help reduce your risk for chlamydia, gonorrhea, herpes, and HIV (human immunodeficiency virus). Treatment is also  important for preventing complications in pregnant women, because this condition can cause an early (premature) delivery. What are the causes? This condition is caused by an increase in harmful bacteria that are normally present in small amounts in the vagina. However, the reason that the condition develops is not fully understood. What increases the risk? The following factors may make you more likely to develop this condition: Having a new sexual partner or multiple sexual partners. Having unprotected sex. Douching. Having an intrauterine device (IUD). Smoking. Drug and alcohol abuse. Taking certain antibiotic medicines. Being pregnant. You cannot get bacterial vaginosis from toilet seats, bedding, swimming pools, or contact with objects around you. What are the signs or symptoms? Symptoms of this condition include: Grey or white vaginal discharge. The discharge can also be watery or foamy. A fish-like odor with discharge, especially after sexual intercourse or during menstruation. Itching in and around the vagina. Burning or pain with urination. Some women with bacterial vaginosis have no signs or symptoms. How is this diagnosed? This condition is diagnosed based on: Your medical history. A physical exam of the vagina. Testing a sample of vaginal fluid under a microscope to look for a large amount of bad bacteria or abnormal cells. Your health care provider may use a cotton swab or a small wooden spatula to collect the sample. How is this treated? This condition is treated with antibiotics. These may be given as a pill, a vaginal cream, or a medicine that is put into the vagina (suppository). If the condition comes back after treatment, a second round of antibiotics may be needed. Follow these instructions at home: Medicines  Take over-the-counter and prescription medicines only as told by your health care provider. Take or use your antibiotic as told by your health care provider.  Do not stop taking or using the antibiotic even if you start to feel better. General instructions  If you have a female sexual partner, tell her that you have a vaginal infection. She should see her health care provider and be treated if she has symptoms. If you have a female sexual partner, he does not need treatment. During treatment: Avoid sexual activity until you finish treatment. Do not douche. Avoid alcohol as directed by your health care provider. Avoid breastfeeding as directed by your health care provider. Drink enough water and fluids to keep your urine clear or pale yellow. Keep the area around your vagina and rectum clean. Wash the area daily with warm water. Wipe yourself from front to back after using the toilet. Keep all follow-up visits as told by your health care provider. This is important. How is this prevented? Do not douche. Wash the outside of your vagina with warm water only. Use protection when having sex. This includes latex condoms and dental dams. Limit how many sexual partners you have. To help prevent bacterial vaginosis, it is best to have sex with just one partner (monogamous). Make sure you and your sexual partner are tested for STIs. Wear cotton or cotton-lined underwear. Avoid wearing tight pants and pantyhose, especially during summer. Limit the amount of alcohol that you drink. Do not use any products that contain nicotine or tobacco, such as cigarettes and e-cigarettes. If you need   help quitting, ask your health care provider. Do not use illegal drugs. Where to find more information: Centers for Disease Control and Prevention: www.cdc.gov/std American Sexual Health Association (ASHA): www.ashastd.org [place] Department of Health and Human Services, Office on Women's Health: www.womenshealth.gov/ or https://www.womenshealth.gov/a-z-topics/bacterial-vaginosis Contact a health care provider if: Your symptoms do not improve, even after treatment. You have  more discharge or pain when urinating. You have a fever. You have pain in your abdomen. You have pain during sex. You have vaginal bleeding between periods. Summary Bacterial vaginosis is a vaginal infection that occurs when the normal balance of bacteria in the vagina is disrupted. Because bacterial vaginosis increases your risk for STIs (sexually transmitted infections), getting treated can help reduce your risk for chlamydia, gonorrhea, herpes, and HIV (human immunodeficiency virus). Treatment is also important for preventing complications in pregnant women, because the condition can cause an early (premature) delivery. This condition is treated with antibiotic medicines. These may be given as a pill, a vaginal cream, or a medicine that is put into the vagina (suppository). This information is not intended to replace advice given to you by your health care provider. Make sure you discuss any questions you have with your health care provider. Document Released: 10/03/2005 Document Revised: 06/18/2016 Document Reviewed: 06/18/2016 Elsevier Interactive Patient Education  2017 Elsevier Inc.  

## 2016-12-01 NOTE — Progress Notes (Signed)
Patient: Cheryl Morgan Female    DOB: 1995-11-17   20 y.o.   MRN: [id number] Visit Date: 12/01/2016  Today's Provider: Margaretann Loveless, PA-C   Chief Complaint  Patient presents with  . Vaginal Discharge   Subjective:    HPI Patient is here today c/o abnormal vaginal discharge x's 1 week. Patient reports she is sexually active and not concerned about STD's. Patient reports discharge is thick white and has an odor at times and has been having vaginal itching. Patient denies blood, burning or painful urination. Patient reports lower abdominal pain and constipation x's 4 days. She has previously been seen for constipation and is to be using Miralax but she has not been using.    No Known Allergies   Current Outpatient Prescriptions:  .  buPROPion (WELLBUTRIN) 75 MG tablet, Take 75 mg by mouth 2 (two) times daily., Disp: , Rfl:  .  Levonorgestrel (SKYLA) 13.5 MG IUD, by Intrauterine route., Disp: , Rfl:   Review of Systems  Constitutional: Negative.   Respiratory: Negative.   Cardiovascular: Negative.   Gastrointestinal: Positive for abdominal pain and constipation.  Genitourinary: Positive for vaginal discharge and vaginal pain (itching). Negative for dysuria, flank pain, frequency, genital sores, hematuria, menstrual problem, pelvic pain and vaginal bleeding.    Social History  Substance Use Topics  . Smoking status: Never Smoker  . Smokeless tobacco: Never Used  . Alcohol use Yes     Comment: occassionally   Objective:   BP 100/68 (BP Location: Left Arm, Patient Position: Sitting, Cuff Size: Large)   Pulse 60   Temp 98.2 F (36.8 C) (Oral)   Resp 16   Wt 180 lb (81.6 kg)   LMP 11/22/2016 (Exact Date)   BMI 28.19 kg/m   Physical Exam  Constitutional: She appears well-developed and well-nourished. No distress.  Neck: Normal range of motion. Neck supple.  Cardiovascular: Normal rate, regular rhythm and normal heart sounds.  Exam reveals no gallop and no  friction rub.   No murmur heard. Pulmonary/Chest: Effort normal and breath sounds normal. No respiratory distress. She has no wheezes. She has no rales.  Abdominal: Soft. Bowel sounds are normal. She exhibits no distension and no mass. There is generalized tenderness. There is no rebound and no guarding.  Genitourinary: There is rash (erythema) on the right labia. There is no tenderness, lesion or injury on the right labia. There is rash (erythema) on the left labia. There is no tenderness, lesion or injury on the left labia. There is erythema in the vagina. No tenderness or bleeding in the vagina. No signs of injury around the vagina. Vaginal discharge (copious, white, mucous like discharge noted) found.  Skin: She is not diaphoretic.  Vitals reviewed.      Assessment & Plan:     1. Vaginal discharge UA negative. Wet prep positive for clue cells and hyphae. Mild bacteria. Will treat with metronidazole for BV and diflucan for yeast infection. Advised she may use topical hydrocortisone cream in a small amount twice daily for irritation of the external tissues (labia majora mostly). She is to call if symptoms fail to improve.  - POCT urinalysis dipstick - POCT Wet Prep [facility])  2. BV (bacterial vaginosis) See above medical treatment plan. - metroNIDAZOLE (FLAGYL) 500 MG tablet; Take 1 tablet (500 mg total) by mouth 2 (two) times daily.  Dispense: 14 tablet; Refill: 0  3. Yeast infection See above medical treatment plan. - fluconazole (DIFLUCAN)  150 MG tablet; Take 1 tablet (150 mg total) by mouth once.  Dispense: 1 tablet; Refill: 0       Margaretann LovelessJennifer M Komal Stangelo, PA-C  [facility]

## 2016-12-02 LAB — POCT WET PREP (WET MOUNT): Trichomonas Wet Prep HPF POC: ABSENT

## 2016-12-05 ENCOUNTER — Other Ambulatory Visit: Payer: Self-pay | Admitting: Gynecology

## 2016-12-05 ENCOUNTER — Ambulatory Visit (INDEPENDENT_AMBULATORY_CARE_PROVIDER_SITE_OTHER): Payer: BLUE CROSS/BLUE SHIELD | Admitting: Gynecology

## 2016-12-05 ENCOUNTER — Ambulatory Visit (INDEPENDENT_AMBULATORY_CARE_PROVIDER_SITE_OTHER): Payer: BLUE CROSS/BLUE SHIELD

## 2016-12-05 ENCOUNTER — Encounter: Payer: Self-pay | Admitting: Gynecology

## 2016-12-05 VITALS — BP 122/80 | Ht 68.0 in | Wt 180.0 lb

## 2016-12-05 DIAGNOSIS — N921 Excessive and frequent menstruation with irregular cycle: Secondary | ICD-10-CM

## 2016-12-05 DIAGNOSIS — R102 Pelvic and perineal pain: Secondary | ICD-10-CM

## 2016-12-05 DIAGNOSIS — Z975 Presence of (intrauterine) contraceptive device: Secondary | ICD-10-CM | POA: Diagnosis not present

## 2016-12-05 DIAGNOSIS — Z30432 Encounter for removal of intrauterine contraceptive device: Secondary | ICD-10-CM

## 2016-12-05 DIAGNOSIS — Z30011 Encounter for initial prescription of contraceptive pills: Secondary | ICD-10-CM

## 2016-12-05 DIAGNOSIS — N831 Corpus luteum cyst of ovary, unspecified side: Secondary | ICD-10-CM | POA: Diagnosis not present

## 2016-12-05 LAB — PREGNANCY, URINE: Preg Test, Ur: NEGATIVE

## 2016-12-05 MED ORDER — LEVONORGESTREL-ETHINYL ESTRAD 0.1-20 MG-MCG PO TABS: ORAL_TABLET | ORAL | 4 refills | Status: DC

## 2016-12-05 NOTE — Patient Instructions (Signed)
Oral Contraception Information Oral contraceptive pills (OCPs) are medicines taken to prevent pregnancy. OCPs work by preventing the ovaries from releasing eggs. The hormones in OCPs also cause the cervical mucus to thicken, preventing the sperm from entering the uterus. The hormones also cause the uterine lining to become thin, not allowing a fertilized egg to attach to the inside of the uterus. OCPs are highly effective when taken exactly as prescribed. However, OCPs do not prevent sexually transmitted diseases (STDs). Safe sex practices, such as using condoms along with the pill, can help prevent STDs.  Before taking the pill, you may have a physical exam and Pap test. Your health care provider may order blood tests. The health care provider will make sure you are a good candidate for oral contraception. Discuss with your health care provider the possible side effects of the OCP you may be prescribed. When starting an OCP, it can take 2 to 3 months for the body to adjust to the changes in hormone levels in your body.  TYPES OF ORAL CONTRACEPTION  The combination pill-This pill contains estrogen and progestin (synthetic progesterone) hormones. The combination pill comes in 21-day, 28-day, or 91-day packs. Some types of combination pills are meant to be taken continuously (365-day pills). With 21-day packs, you do not take pills for 7 days after the last pill. With 28-day packs, the pill is taken every day. The last 7 pills are without hormones. Certain types of pills have more than 21 hormone-containing pills. With 91-day packs, the first 84 pills contain both hormones, and the last 7 pills contain no hormones or contain estrogen only.  The minipill-This pill contains the progesterone hormone only. The pill is taken every day continuously. It is very important to take the pill at the same time each day. The minipill comes in packs of 28 pills. All 28 pills contain the hormone.  ADVANTAGES OF ORAL  CONTRACEPTIVE PILLS  Decreases premenstrual symptoms.   Treats menstrual period cramps.   Regulates the menstrual cycle.   Decreases a heavy menstrual flow.   May treatacne, depending on the type of pill.   Treats abnormal uterine bleeding.   Treats polycystic ovarian syndrome.   Treats endometriosis.   Can be used as emergency contraception.  THINGS THAT CAN MAKE ORAL CONTRACEPTIVE PILLS LESS EFFECTIVE OCPs can be less effective if:   You forget to take the pill at the same time every day.   You have a stomach or intestinal disease that lessens the absorption of the pill.   You take OCPs with other medicines that make OCPs less effective, such as antibiotics, certain HIV medicines, and some seizure medicines.   You take expired OCPs.   You forget to restart the pill on day 7, when using the packs of 21 pills.  RISKS ASSOCIATED WITH ORAL CONTRACEPTIVE PILLS  Oral contraceptive pills can sometimes cause side effects, such as:  Headache.  Nausea.  Breast tenderness.  Irregular bleeding or spotting. Combination pills are also associated with a small increased risk of:  Blood clots.  Heart attack.  Stroke. This information is not intended to replace advice given to you by your health care provider. Make sure you discuss any questions you have with your health care provider. Document Released: 12/24/2002 Document Revised: 01/25/2016 Document Reviewed: 03/24/2013 Elsevier Interactive Patient Education  2017 [employer]vier Inc[employer]  

## 2016-12-05 NOTE — Progress Notes (Signed)
   Patient is a 21 year old who had a Skyla IUD replaced in January 2017. She presented to the office with 5 days of cramping and spotting. She does have issues also with acne. She is contemplating having the IUD removed and started on oral contraceptive pill. We did do a urine present test here in the office today which was negative. Patient denied any GU or GI complaints. She had described some of her discomfort mostly on the left lower quadrant.  Exam: Gen. appearance: Well developed well nourished female in no acute distress Back: No CVA tenderness Abdomen: Soft nontender no rebound or guarding Speculum exam: The IUD string was visualized retrieved shown to the patient discarded speculum was removed and since we're going to do an ultrasound would not subject her to a pelvic exam.  Ultrasound: Uterus measures 6.7 x 4.8 x 2.7 cm. Some fluid in the endometrial cavity right ovary normal left ovary corpus luteum cyst thick wall with positive blood flow in the periphery measuring 22 x 19 mm. No fluid in the cul-de-sac.  Assessment/plan: Patient with pelvic discomfort and spotting with IUD IUD was removed. Ultrasound essentially normal with exception small left corpus luteum cyst. And start her on a continuous 20 g oral contraceptive pills of that she will be drawn every 3 months. Patient scheduled to return back in 2 months for her annual exam or when necessary.

## 2016-12-09 ENCOUNTER — Ambulatory Visit (INDEPENDENT_AMBULATORY_CARE_PROVIDER_SITE_OTHER): Payer: BLUE CROSS/BLUE SHIELD | Admitting: Family Medicine

## 2016-12-09 ENCOUNTER — Encounter: Payer: Self-pay | Admitting: Family Medicine

## 2016-12-09 VITALS — BP 98/74 | HR 90 | Temp 98.5°F | Resp 16 | Wt 180.2 lb

## 2016-12-09 DIAGNOSIS — J039 Acute tonsillitis, unspecified: Secondary | ICD-10-CM

## 2016-12-09 DIAGNOSIS — J029 Acute pharyngitis, unspecified: Secondary | ICD-10-CM

## 2016-12-09 LAB — POCT RAPID STREP A (OFFICE): RAPID STREP A SCREEN: NEGATIVE

## 2016-12-09 NOTE — Patient Instructions (Signed)
Let me know if not improving or new symptoms. 

## 2016-12-09 NOTE — Progress Notes (Signed)
Subjective:     Patient ID: Cheryl Morgan, female   DOB: 04/25/1996, 21 y.o.   MRN: [id number]  HPI  Chief Complaint  Patient presents with  . Sore Throat    Patient comes in office today with concerns of sore throat for the past 3 days. Patient reports that she has a ulcer on her uvula and swollen tonsils. Paitent states that she has difficulty swallowing and pain in the right ear. Patient states that she has no other URI/Flu like symptoms, she has been taking otc cough drops for relief.   Reports history of oral ulcers.   Review of Systems     Objective:   Physical Exam  Constitutional: She appears well-developed and well-nourished. No distress.  Ears: T.M's intact without inflammation Throat: moderate tonsillar enlargement without exudate and minimal erythema Neck: Tender right anterior cervical node Lungs: clear     Assessment:    1. Pharyngitis, unspecified etiology - POCT rapid strep A  2. Tonsillitis: suspect viral - Culture, Group A Strep    Plan:    Further f/u pending strep culture or new symptoms

## 2016-12-12 ENCOUNTER — Encounter: Payer: Self-pay | Admitting: Physician Assistant

## 2016-12-12 ENCOUNTER — Ambulatory Visit (INDEPENDENT_AMBULATORY_CARE_PROVIDER_SITE_OTHER): Payer: BLUE CROSS/BLUE SHIELD | Admitting: Physician Assistant

## 2016-12-12 ENCOUNTER — Telehealth: Payer: Self-pay

## 2016-12-12 VITALS — BP 120/70 | HR 81 | Temp 98.3°F | Resp 16 | Wt 178.0 lb

## 2016-12-12 DIAGNOSIS — J020 Streptococcal pharyngitis: Secondary | ICD-10-CM | POA: Diagnosis not present

## 2016-12-12 DIAGNOSIS — H6983 Other specified disorders of Eustachian tube, bilateral: Secondary | ICD-10-CM

## 2016-12-12 LAB — CULTURE, GROUP A STREP

## 2016-12-12 MED ORDER — AMOXICILLIN 875 MG PO TABS: 875.0000 mg | ORAL_TABLET | Freq: Two times a day (BID) | ORAL | 0 refills | Status: DC

## 2016-12-12 NOTE — Progress Notes (Signed)
Patient: Cheryl Morgan Female    DOB: 08-Aug-1996   21 y.o.   MRN: [id number] Visit Date: 12/12/2016  Today's Provider: Margaretann Loveless, PA-C   Chief Complaint  Patient presents with  . Ear Pain   Subjective:    HPI Patient comes in today c/o of ear pain. Throat pain is still present as well, but slightly improved. Patient denies cough, or fever. She was seen on Friday by Toni Arthurs, PA-C. Rapid strep had been negative, but strep culture was positive for beta hemolytic strep B.     No Known Allergies   Current Outpatient Prescriptions:  .  buPROPion (WELLBUTRIN) 75 MG tablet, Take 75 mg by mouth 2 (two) times daily., Disp: , Rfl:  .  levonorgestrel-ethinyl estradiol (AVIANE,ALESSE,LESSINA) 0.1-20 MG-MCG tablet, Patient to taking the oral contraceptive pill continuously and withdrawal every 3 months as instructed, Disp: 3 Package, Rfl: 4  Review of Systems  Constitutional: Positive for fatigue. Negative for chills and fever.  HENT: Positive for congestion, ear pain, sinus pain, sinus pressure, sore throat and trouble swallowing. Negative for postnasal drip and rhinorrhea.   Respiratory: Positive for chest tightness, shortness of breath and wheezing.   Cardiovascular: Negative.   Neurological: Negative for dizziness, light-headedness and headaches.    Social History  Substance Use Topics  . Smoking status: Never Smoker  . Smokeless tobacco: Never Used  . Alcohol use Yes     Comment: occassionally   Objective:   BP 120/70 (BP Location: Left Arm, Patient Position: Sitting, Cuff Size: Large)   Pulse 81   Temp 98.3 F (36.8 C) (Oral)   Resp 16   Wt 178 lb (80.7 kg)   LMP 11/28/2016 Comment: IUD REMOVED  SpO2 99%   BMI 27.06 kg/m   Physical Exam  Constitutional: She appears well-developed and well-nourished. No distress.  HENT:  Head: Normocephalic and atraumatic.  Right Ear: Hearing, external ear and ear canal normal. Tympanic membrane is bulging.  Tympanic membrane is not perforated and not erythematous. A middle ear effusion (clear serous) is present.  Left Ear: Hearing, external ear and ear canal normal. Tympanic membrane is not perforated, not erythematous and not bulging. A middle ear effusion (clear serous) is present.  Nose: Nose normal.  Mouth/Throat: Uvula is midline, oropharynx is clear and moist and mucous membranes are normal. No oropharyngeal exudate.  Eyes: Conjunctivae are normal. Pupils are equal, round, and reactive to light. Right eye exhibits no discharge. Left eye exhibits no discharge. No scleral icterus.  Neck: Normal range of motion. Neck supple. No tracheal deviation present. No thyromegaly present.  Cardiovascular: Normal rate, regular rhythm and normal heart sounds.  Exam reveals no gallop and no friction rub.   No murmur heard. Pulmonary/Chest: Effort normal and breath sounds normal. No stridor. No respiratory distress. She has no wheezes. She has no rales.  Lymphadenopathy:    She has no cervical adenopathy.  Skin: Skin is warm and dry. She is not diaphoretic.  Vitals reviewed.     Assessment & Plan:     1. Strep throat Strep B positive from culture with symptoms still present x 6 days. Will give amoxil as below. She is to continue salt water gargles and call if symptoms worsen. - amoxicillin (AMOXIL) 875 MG tablet; Take 1 tablet (875 mg total) by mouth 2 (two) times daily.  Dispense: 10 tablet; Refill: 0  2. Dysfunction of both eustachian tubes Serous fluid noted behind both TM. Advised patient  to use flonase or sudafed for fluid. Discussed starting loratadine for seasonal allergies. She is to call if no improvement.       Margaretann LovelessJennifer M Arletta Lumadue, PA-C  [facility]

## 2016-12-12 NOTE — Patient Instructions (Signed)
Eustachian Tube Dysfunction Introduction The eustachian tube connects the middle ear to the back of the nose. It regulates air pressure in the middle ear by allowing air to move between the ear and nose. It also helps to drain fluid from the middle ear space. When the eustachian tube does not function properly, air pressure, fluid, or both can build up in the middle ear. Eustachian tube dysfunction can affect one or both ears. What are the causes? This condition happens when the eustachian tube becomes blocked or cannot open normally. This may result from:  Ear infections.  Colds and other upper respiratory infections.  Allergies.  Irritation, such as from cigarette smoke or acid from the stomach coming up into the esophagus (gastroesophageal reflux).  Sudden changes in air pressure, such as from descending in an airplane.  Abnormal growths in the nose or throat, such as nasal polyps, tumors, or enlarged tissue at the back of the throat (adenoids). What increases the risk? This condition may be more likely to develop in people who smoke and people who are overweight. Eustachian tube dysfunction may also be more likely to develop in children, especially children who have:  Certain birth defects of the mouth, such as cleft palate.  Large tonsils and adenoids. What are the signs or symptoms? Symptoms of this condition may include:  A feeling of fullness in the ear.  Ear pain.  Clicking or popping noises in the ear.  Ringing in the ear.  Hearing loss.  Loss of balance. Symptoms may get worse when the air pressure around you changes, such as when you travel to an area of high elevation or fly on an airplane. How is this diagnosed? This condition may be diagnosed based on:  Your symptoms.  A physical exam of your ear, nose, and throat.  Tests, such as those that measure:  The movement of your eardrum (tympanogram).  Your hearing (audiometry). How is this  treated? Treatment depends on the cause and severity of your condition. If your symptoms are mild, you may be able to relieve your symptoms by moving air into ("popping") your ears. If you have symptoms of fluid in your ears, treatment may include:  Decongestants.  Antihistamines.  Nasal sprays or ear drops that contain medicines that reduce swelling (steroids). In some cases, you may need to have a procedure to drain the fluid in your eardrum (myringotomy). In this procedure, a small tube is placed in the eardrum to:  Drain the fluid.  Restore the air in the middle ear space. Follow these instructions at home:  Take over-the-counter and prescription medicines only as told by your health care provider.  Use techniques to help pop your ears as recommended by your health care provider. These may include:  Chewing gum.  Yawning.  Frequent, forceful swallowing.  Closing your mouth, holding your nose closed, and gently blowing as if you are trying to blow air out of your nose.  Do not do any of the following until your health care provider approves:  Travel to high altitudes.  Fly in airplanes.  Work in a pressurized cabin or room.  Scuba dive.  Keep your ears dry. Dry your ears completely after showering or bathing.  Do not smoke.  Keep all follow-up visits as told by your health care provider. This is important. Contact a health care provider if:  Your symptoms do not go away after treatment.  Your symptoms come back after treatment.  You are unable to pop your ears.    You have:  A fever.  Pain in your ear.  Pain in your head or neck.  Fluid draining from your ear.  Your hearing suddenly changes.  You become very dizzy.  You lose your balance. This information is not intended to replace advice given to you by your health care provider. Make sure you discuss any questions you have with your health care provider. Document Released: 10/30/2015 Document  Revised: 03/10/2016 Document Reviewed: 10/22/2014  2017 Elsevier  

## 2016-12-13 ENCOUNTER — Encounter: Payer: BLUE CROSS/BLUE SHIELD | Admitting: Women's Health

## 2017-01-13 ENCOUNTER — Encounter: Payer: Self-pay | Admitting: Physician Assistant

## 2017-01-13 ENCOUNTER — Ambulatory Visit (INDEPENDENT_AMBULATORY_CARE_PROVIDER_SITE_OTHER): Payer: BLUE CROSS/BLUE SHIELD | Admitting: Physician Assistant

## 2017-01-13 VITALS — BP 110/60 | HR 97 | Temp 98.5°F | Resp 16 | Wt 178.8 lb

## 2017-01-13 DIAGNOSIS — R300 Dysuria: Secondary | ICD-10-CM

## 2017-01-13 DIAGNOSIS — N3001 Acute cystitis with hematuria: Secondary | ICD-10-CM

## 2017-01-13 LAB — POCT URINALYSIS DIPSTICK
Bilirubin, UA: NEGATIVE
Glucose, UA: NEGATIVE
NITRITE UA: NEGATIVE
PH UA: 6.5 (ref 5.0–8.0)
Spec Grav, UA: 1.015 (ref 1.030–1.035)
UROBILINOGEN UA: 0.2 (ref ?–2.0)

## 2017-01-13 MED ORDER — SULFAMETHOXAZOLE-TRIMETHOPRIM 800-160 MG PO TABS: 1.0000 | ORAL_TABLET | Freq: Two times a day (BID) | ORAL | 0 refills | Status: DC

## 2017-01-13 NOTE — Patient Instructions (Signed)

## 2017-01-13 NOTE — Progress Notes (Signed)
Patient: Cheryl Morgan Female    DOB: 08-Jul-1996   20 y.o.   MRN: [id number] Visit Date: 01/13/2017  Today's Provider: Margaretann Loveless, PA-C   Chief Complaint  Patient presents with  . Urinary Tract Infection   Subjective:    Urinary Tract Infection   This is a new problem. The current episode started 1 to 4 weeks ago (3 weeks ). The problem occurs every urination. The problem has been gradually worsening. The quality of the pain is described as burning and aching. The pain is moderate (yesterday). There has been no fever. She is sexually active. Associated symptoms include chills. Pertinent negatives include no discharge, hematuria or vomiting. Associated symptoms comments: Vaginal itching. She has tried increased fluids and NSAIDs (Cystix for the past 2 weeks but is not working anymore) for the symptoms. The treatment provided no relief.      No Known Allergies   Current Outpatient Prescriptions:  .  buPROPion (WELLBUTRIN) 75 MG tablet, Take 75 mg by mouth 2 (two) times daily., Disp: , Rfl:  .  amoxicillin (AMOXIL) 875 MG tablet, Take 1 tablet (875 mg total) by mouth 2 (two) times daily. (Patient not taking: Reported on 01/13/2017), Disp: 10 tablet, Rfl: 0 .  levonorgestrel-ethinyl estradiol (AVIANE,ALESSE,LESSINA) 0.1-20 MG-MCG tablet, Patient to taking the oral contraceptive pill continuously and withdrawal every 3 months as instructed, Disp: 3 Package, Rfl: 4  Review of Systems  Constitutional: Positive for chills. Negative for fever.  Cardiovascular: Negative for chest pain, palpitations and leg swelling.  Gastrointestinal: Positive for abdominal pain (lower abdomen). Negative for vomiting.  Genitourinary: Positive for dysuria. Negative for hematuria.  Musculoskeletal: Positive for back pain (lower back pain).    Social History  Substance Use Topics  . Smoking status: Never Smoker  . Smokeless tobacco: Never Used  . Alcohol use Yes     Comment: occassionally     Objective:   BP 110/60 (BP Location: Right Arm, Patient Position: Sitting, Cuff Size: Normal)   Pulse 97   Temp 98.5 F (36.9 C) (Oral)   Resp 16   Wt 178 lb 12.8 oz (81.1 kg)   BMI 27.19 kg/m    Physical Exam  Constitutional: She is oriented to person, place, and time. She appears well-developed and well-nourished. No distress.  Cardiovascular: Normal rate, regular rhythm and normal heart sounds.  Exam reveals no gallop and no friction rub.   No murmur heard. Pulmonary/Chest: Effort normal and breath sounds normal. No respiratory distress. She has no wheezes. She has no rales.  Abdominal: Soft. Normal appearance and bowel sounds are normal. She exhibits no distension and no mass. There is no hepatosplenomegaly. There is tenderness in the suprapubic area. There is no rebound, no guarding and no CVA tenderness.  Suprapubic tenderness  Neurological: She is alert and oriented to person, place, and time.  Skin: Skin is warm and dry. She is not diaphoretic.  Vitals reviewed.     Assessment & Plan:     1. Acute cystitis with hematuria Does have recurrent UTI. Continue Cystix. UA today was positive. Will treat empirically with Bactrim as below. Will send urine for culture and will f/u pending these results. She is to call if no improvement. Once completed treatment if symptoms have improved may add macrobid once daily x 6 months to prevent.  - sulfamethoxazole-trimethoprim (BACTRIM DS,SEPTRA DS) 800-160 MG tablet; Take 1 tablet by mouth 2 (two) times daily.  Dispense: 14 tablet; Refill: 0  2. Dysuria See above medical treatment plan. - POCT urinalysis dipstick - Urine culture       Margaretann Loveless, PA-C  [facility]

## 2017-01-19 LAB — URINE CULTURE

## 2017-01-19 NOTE — Telephone Encounter (Signed)
Encounter opened on error. KW °

## 2017-02-07 ENCOUNTER — Encounter: Payer: BLUE CROSS/BLUE SHIELD | Admitting: Women's Health

## 2017-02-07 DIAGNOSIS — Z0289 Encounter for other administrative examinations: Secondary | ICD-10-CM

## 2017-02-23 ENCOUNTER — Ambulatory Visit: Payer: BLUE CROSS/BLUE SHIELD | Admitting: Physician Assistant

## 2017-03-01 ENCOUNTER — Encounter: Payer: Self-pay | Admitting: Gynecology

## 2017-08-09 ENCOUNTER — Ambulatory Visit: Payer: BLUE CROSS/BLUE SHIELD | Admitting: Physician Assistant

## 2017-08-09 NOTE — Progress Notes (Deleted)
   Patient: Cheryl Morgan Female    DOB: 07/22/1996   20 y.o.   MRN: [id number] Visit Date: 08/09/2017  Today's Provider: Margaretann LovelessJennifer M Burnette, PA-C   No chief complaint on file.  Subjective:    Urinary Tract Infection   Associated symptoms comments: metrorrhagia.      Previous Medications   BUPROPION (WELLBUTRIN) 75 MG TABLET    Take 75 mg by mouth 2 (two) times daily.   LEVONORGESTREL-ETHINYL ESTRADIOL (AVIANE,ALESSE,LESSINA) 0.1-20 MG-MCG TABLET    Patient to taking the oral contraceptive pill continuously and withdrawal every 3 months as instructed    Review of Systems  Constitutional: Negative.   Respiratory: Negative.   Cardiovascular: Negative.   Genitourinary:       Metrorrhagia    Social History  Substance Use Topics  . Smoking status: Never Smoker  . Smokeless tobacco: Never Used  . Alcohol use Yes     Comment: occassionally   Objective:   There were no vitals taken for this visit.  Physical Exam      Assessment & Plan:       Follow up: No Follow-up on file.

## 2017-08-22 ENCOUNTER — Encounter: Payer: Self-pay | Admitting: Physician Assistant

## 2017-08-22 ENCOUNTER — Ambulatory Visit (INDEPENDENT_AMBULATORY_CARE_PROVIDER_SITE_OTHER): Payer: BLUE CROSS/BLUE SHIELD | Admitting: Physician Assistant

## 2017-08-22 ENCOUNTER — Ambulatory Visit
Admission: RE | Admit: 2017-08-22 | Discharge: 2017-08-22 | Disposition: A | Discharge status: Home / Self Care | Payer: BLUE CROSS/BLUE SHIELD | Source: Ambulatory Visit | Attending: Physician Assistant | Admitting: Physician Assistant

## 2017-08-22 VITALS — BP 104/68 | HR 80 | Resp 16 | Wt 159.0 lb

## 2017-08-22 DIAGNOSIS — R062 Wheezing: Secondary | ICD-10-CM | POA: Insufficient documentation

## 2017-08-22 DIAGNOSIS — R05 Cough: Secondary | ICD-10-CM | POA: Diagnosis not present

## 2017-08-22 DIAGNOSIS — M419 Scoliosis, unspecified: Secondary | ICD-10-CM | POA: Diagnosis not present

## 2017-08-22 DIAGNOSIS — R350 Frequency of micturition: Secondary | ICD-10-CM

## 2017-08-22 DIAGNOSIS — J984 Other disorders of lung: Secondary | ICD-10-CM | POA: Diagnosis not present

## 2017-08-22 DIAGNOSIS — J40 Bronchitis, not specified as acute or chronic: Secondary | ICD-10-CM

## 2017-08-22 DIAGNOSIS — R300 Dysuria: Secondary | ICD-10-CM

## 2017-08-22 DIAGNOSIS — R059 Cough, unspecified: Secondary | ICD-10-CM

## 2017-08-22 MED ORDER — ALBUTEROL SULFATE HFA 108 (90 BASE) MCG/ACT IN AERS: 2.0000 | INHALATION_SPRAY | Freq: Four times a day (QID) | RESPIRATORY_TRACT | 2 refills | Status: DC | PRN

## 2017-08-22 MED ORDER — DOXYCYCLINE HYCLATE 100 MG PO TABS: 100.0000 mg | ORAL_TABLET | Freq: Two times a day (BID) | ORAL | 0 refills | Status: AC

## 2017-08-22 NOTE — Progress Notes (Signed)
Patient: Cheryl Morgan Female    DOB: 11/30/1995   21 y.o.   MRN: [id number] Visit Date: 08/22/2017  Today's Provider: Trey SailorsAdriana M Pollak, PA-C   Chief Complaint  Patient presents with  . URI    Started about four days ago.   Subjective:    URI   This is a new problem. The problem has been gradually worsening. There has been no fever. Associated symptoms include congestion, coughing, headaches, a plugged ear sensation, sinus pain, a sore throat and wheezing. Pertinent negatives include no abdominal pain, diarrhea, ear pain, nausea, rhinorrhea, sneezing or vomiting. She has tried decongestant for the symptoms. The treatment provided no relief.  Urinary Tract Infection   This is a recurrent problem. The current episode started 1 to 4 weeks ago. The problem has been gradually worsening. She is sexually active. Associated symptoms include frequency and urgency. Pertinent negatives include no chills, discharge, hematuria, nausea or vomiting.       No Known Allergies   Current Outpatient Medications:  .  buPROPion (WELLBUTRIN) 75 MG tablet, Take 75 mg by mouth 2 (two) times daily., Disp: , Rfl:  .  levonorgestrel-ethinyl estradiol (AVIANE,ALESSE,LESSINA) 0.1-20 MG-MCG tablet, Patient to taking the oral contraceptive pill continuously and withdrawal every 3 months as instructed (Patient not taking: Reported on 08/22/2017), Disp: 3 Package, Rfl: 4  Review of Systems  Constitutional: Positive for fever. Negative for activity change, appetite change, chills, diaphoresis, fatigue and unexpected weight change.  HENT: Positive for congestion, nosebleeds, postnasal drip, sinus pressure, sinus pain, sore throat, tinnitus and trouble swallowing. Negative for ear discharge, ear pain, rhinorrhea and sneezing.   Respiratory: Positive for cough, chest tightness, shortness of breath and wheezing. Negative for choking and stridor.   Gastrointestinal: Positive for constipation. Negative for  abdominal distention, abdominal pain, anal bleeding, blood in stool, diarrhea, nausea, rectal pain and vomiting.  Genitourinary: Positive for frequency and urgency. Negative for hematuria.  Neurological: Positive for dizziness and headaches. Negative for light-headedness.    Social History   Tobacco Use  . Smoking status: Never Smoker  . Smokeless tobacco: Never Used  Substance Use Topics  . Alcohol use: Yes    Comment: occassionally   Objective:   BP 104/68 (BP Location: Right Arm, Patient Position: Sitting, Cuff Size: Normal)   Pulse 80   Resp 16   Wt 159 lb (72.1 kg)   LMP 08/05/2017   SpO2 97%   BMI 24.18 kg/m  Vitals:   08/22/17 1605  BP: 104/68  Pulse: 80  Resp: 16  SpO2: 97%  Weight: 159 lb (72.1 kg)     Physical Exam  Constitutional: She is oriented to person, place, and time. She appears well-developed and well-nourished.  HENT:  Right Ear: External ear normal.  Left Ear: External ear normal.  Mouth/Throat: Oropharynx is clear and moist. No oropharyngeal exudate.  Eyes: Conjunctivae are normal.  Cardiovascular: Normal rate and regular rhythm.  Pulmonary/Chest: Effort normal. She has wheezes.  Wheezes and rhonchi diffusely.   Lymphadenopathy:    She has no cervical adenopathy.  Neurological: She is alert and oriented to person, place, and time.  Skin: Skin is warm and dry.  Psychiatric: She has a normal mood and affect. Her behavior is normal.        Assessment & Plan:     1. Bronchitis  CXR revealed increased interstitial markings consistent with bronchitis or possible atypical respiratory infection. Covered her with doxycycline at office visit as  she felt she had UTI, this should also cover respiratory pathogens. I have personally reviewed this xray and agree with the findings.  2. Cough  See above.   - DG Chest 2 View; Future  3. Wheezing  - albuterol (PROVENTIL HFA;VENTOLIN HFA) 108 (90 Base) MCG/ACT inhaler; Inhale 2 puffs every 6 (six)  hours as needed into the lungs for wheezing or shortness of breath.  Dispense: 1 Inhaler; Refill: 2 - DG Chest 2 View; Future  4. Dysuria  Took azo tab today, urinalysis will not be accurate in office. Will send for culture. Patient has frequent UTIs, feel she has one today. Will treat empirically.   - doxycycline (VIBRA-TABS) 100 MG tablet; Take 1 tablet (100 mg total) 2 (two) times daily for 7 days by mouth.  Dispense: 14 tablet; Refill: 0 - CULTURE, URINE COMPREHENSIVE - Urine Culture  5. Frequency of urination  - doxycycline (VIBRA-TABS) 100 MG tablet; Take 1 tablet (100 mg total) 2 (two) times daily for 7 days by mouth.  Dispense: 14 tablet; Refill: 0 - CULTURE, URINE COMPREHENSIVE - Urine Culture  Return if symptoms worsen or fail to improve.  The entirety of the information documented in the History of Present Illness, Review of Systems and Physical Exam were personally obtained by me. Portions of this information were initially documented by Kavin LeechLaura Walsh, CMA and reviewed by me for thoroughness and accuracy.         Trey SailorsAdriana M Pollak, PA-C  [facility]

## 2017-08-22 NOTE — Patient Instructions (Signed)

## 2017-08-23 ENCOUNTER — Telehealth: Payer: Self-pay

## 2017-08-23 NOTE — Telephone Encounter (Signed)
Pt advised.   Thanks,   -Talisha Erby  

## 2017-08-23 NOTE — Telephone Encounter (Signed)
-----   Message from Trey SailorsAdriana M Pollak, [place]PA-C sent at 08/23/2017  9:18 AM EST ----- CXR shows some increased lung markings, possible atypical respiratory infection. Continue doxycycline and return if not improving.

## 2017-08-24 LAB — URINE CULTURE

## 2017-09-13 ENCOUNTER — Ambulatory Visit (INDEPENDENT_AMBULATORY_CARE_PROVIDER_SITE_OTHER): Payer: BLUE CROSS/BLUE SHIELD | Admitting: Physician Assistant

## 2017-09-13 ENCOUNTER — Encounter: Payer: Self-pay | Admitting: Physician Assistant

## 2017-09-13 VITALS — BP 100/70 | HR 76 | Temp 98.1°F | Resp 20 | Wt 159.0 lb

## 2017-09-13 DIAGNOSIS — J40 Bronchitis, not specified as acute or chronic: Secondary | ICD-10-CM | POA: Diagnosis not present

## 2017-09-13 MED ORDER — AZITHROMYCIN 250 MG PO TABS: ORAL_TABLET | ORAL | 0 refills | Status: DC

## 2017-09-13 MED ORDER — PREDNISONE 10 MG PO TABS
ORAL_TABLET | ORAL | 0 refills | Status: DC
Start: 2017-09-13 — End: 2018-04-03

## 2017-09-13 NOTE — Patient Instructions (Signed)

## 2017-09-13 NOTE — Progress Notes (Signed)
Patient: Cheryl Morgan Female    DOB: 04/10/1996   20 y.o.   MRN: [id number] Visit Date: 09/13/2017  Today's Provider: Margaretann LovelessJennifer M Burnette, PA-C   Chief Complaint  Patient presents with  . URI   Subjective:    HPI Upper Respiratory Infection: Patient complains of symptoms of a URI, possible sinusitis. Symptoms include congestion. Onset of symptoms was 3 weeks ago, gradually worsening since that time. She also c/o facial pain, nasal congestion and non productive cough for the past 4 days .  She is drinking plenty of fluids. Evaluation to date: seen previously and thought to have a viral URI. Treatment to date: antibiotics and albuterol. Patient reports she has been using albuterol every 4 hours since Sunday, patient reports no improvement. Patient reports taking OTC Robitussin, Sudafed cold and sinus reports mild symptom control.      No Known Allergies   Current Outpatient Medications:  .  albuterol (PROVENTIL HFA;VENTOLIN HFA) 108 (90 Base) MCG/ACT inhaler, Inhale 2 puffs every 6 (six) hours as needed into the lungs for wheezing or shortness of breath., Disp: 1 Inhaler, Rfl: 2 .  buPROPion (WELLBUTRIN) 75 MG tablet, Take 75 mg by mouth 2 (two) times daily., Disp: , Rfl:  .  levonorgestrel-ethinyl estradiol (AVIANE,ALESSE,LESSINA) 0.1-20 MG-MCG tablet, Patient to taking the oral contraceptive pill continuously and withdrawal every 3 months as instructed (Patient not taking: Reported on 08/22/2017), Disp: 3 Package, Rfl: 4  Review of Systems  Constitutional: Negative.   HENT: Positive for congestion, sinus pressure, sinus pain and sore throat. Negative for ear pain, hearing loss, postnasal drip and trouble swallowing.   Respiratory: Positive for cough, chest tightness, shortness of breath and wheezing.   Cardiovascular: Negative.   Gastrointestinal: Negative for abdominal pain.  Neurological: Positive for headaches. Negative for dizziness and light-headedness.    Social  History   Tobacco Use  . Smoking status: Never Smoker  . Smokeless tobacco: Never Used  Substance Use Topics  . Alcohol use: Yes    Comment: occassionally   Objective:   There were no vitals taken for this visit. There were no vitals filed for this visit.   Physical Exam  Constitutional: She appears well-developed and well-nourished. No distress.  HENT:  Head: Normocephalic and atraumatic.  Right Ear: Hearing, tympanic membrane, external ear and ear canal normal.  Left Ear: Hearing, tympanic membrane, external ear and ear canal normal.  Nose: Nose normal.  Mouth/Throat: Uvula is midline, oropharynx is clear and moist and mucous membranes are normal. No oropharyngeal exudate.  Eyes: Conjunctivae are normal. Pupils are equal, round, and reactive to light. Right eye exhibits no discharge. Left eye exhibits no discharge. No scleral icterus.  Neck: Normal range of motion. Neck supple. No tracheal deviation present. No thyromegaly present.  Cardiovascular: Normal rate, regular rhythm and normal heart sounds. Exam reveals no gallop and no friction rub.  No murmur heard. Pulmonary/Chest: Effort normal. No stridor. No respiratory distress. She has no wheezes. She has rhonchi (throughout). She has no rales.  Lymphadenopathy:    She has no cervical adenopathy.  Skin: Skin is warm and dry. She is not diaphoretic.  Vitals reviewed.  CLINICAL DATA:  21 year old female with cough, chest tightness and shortness of breath for 4 days. Wheezing.  EXAM: CHEST  2 VIEW  COMPARISON:  Lumbar radiographs 05/17/2016, cervical spine CT 06/23/2011  FINDINGS: Normal cardiac size and mediastinal contours. Normal lung volumes. No pneumothorax, pulmonary edema, pleural effusion or consolidation. There  is mild tenting of the right inferior pulmonary ligament along the surface of the diaphragm with no associated confluent pulmonary opacity. Possible mildly increased interstitial markings in  both lungs. Levoconvex thoracic and partially visible dextroconvex lumbar scoliosis. No acute osseous abnormality identified. Negative visible bowel gas pattern.  IMPRESSION: 1. Possible mild increased interstitial markings in both lungs such as due to viral/atypical respiratory infection. Mild tenting of the right inferior pulmonary ligament without discrete pneumonia/bronchopneumonia. 2. Scoliosis.    Assessment & Plan:     1. Bronchitis Will treat with Zpak and prednisone as below. Can still use Albuterol inhaler prn for SOB/chest tightness. She is to call if symptoms worsen. - azithromycin (ZITHROMAX) 250 MG tablet; Take 2 tablets PO on day one, and one tablet PO daily thereafter until completed.  Dispense: 6 tablet; Refill: 0 - predniSONE (DELTASONE) 10 MG tablet; Take 6 tabs PO on day 1&2, 5 tabs PO on day 3&4, 4 tabs PO on day 5&6, 3 tabs PO on day 7&8, 2 tabs PO on day 9&10, 1 tab PO on day 11&12.  Dispense: 42 tablet; Refill: 0       Margaretann LovelessJennifer M Burnette, PA-C  [facility]

## 2017-11-07 ENCOUNTER — Encounter: Payer: Self-pay | Admitting: Family Medicine

## 2018-04-03 ENCOUNTER — Encounter: Payer: Self-pay | Admitting: Family Medicine

## 2018-04-03 ENCOUNTER — Ambulatory Visit (INDEPENDENT_AMBULATORY_CARE_PROVIDER_SITE_OTHER): Payer: BLUE CROSS/BLUE SHIELD | Admitting: Family Medicine

## 2018-04-03 VITALS — BP 102/64 | HR 56 | Temp 98.4°F | Wt 163.0 lb

## 2018-04-03 DIAGNOSIS — J039 Acute tonsillitis, unspecified: Secondary | ICD-10-CM | POA: Diagnosis not present

## 2018-04-03 DIAGNOSIS — Z30011 Encounter for initial prescription of contraceptive pills: Secondary | ICD-10-CM

## 2018-04-03 DIAGNOSIS — H9203 Otalgia, bilateral: Secondary | ICD-10-CM | POA: Diagnosis not present

## 2018-04-03 DIAGNOSIS — M26629 Arthralgia of temporomandibular joint, unspecified side: Secondary | ICD-10-CM | POA: Diagnosis not present

## 2018-04-03 LAB — POCT URINE PREGNANCY: PREG TEST UR: NEGATIVE

## 2018-04-03 LAB — POCT RAPID STREP A (OFFICE): Rapid Strep A Screen: NEGATIVE

## 2018-04-03 MED ORDER — AMOXICILLIN 875 MG PO TABS: 875.0000 mg | ORAL_TABLET | Freq: Two times a day (BID) | ORAL | 0 refills | Status: DC

## 2018-04-03 MED ORDER — LEVONORGESTREL-ETHINYL ESTRAD 0.1-20 MG-MCG PO TABS: ORAL_TABLET | ORAL | 4 refills | Status: DC

## 2018-04-03 NOTE — Progress Notes (Signed)
Patient: Cheryl Morgan Female    DOB: Dec 02, 1995   22 y.o.   MRN: [id number] Visit Date: 04/03/2018  Today's Provider: Dortha Kern, PA   Chief Complaint  Patient presents with  . Ear Pain   Subjective:    Otalgia   There is pain in both ears. This is a new problem. Episode onset: 2 weeks ago  The problem occurs constantly. The problem has been gradually worsening. There has been no fever. Associated symptoms include a sore throat. Treatments tried: home remedies  The treatment provided no relief.   Past Medical History:  Diagnosis Date  . Seasonal allergies    Past Surgical History:  Procedure Laterality Date  . MOUTH SURGERY    . 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 Hypertension Maternal Aunt   . Diabetes Maternal Grandmother   . Hypertension Maternal Grandmother   . Depression Maternal Grandmother    No Known Allergies  Current Outpatient Medications:  .  albuterol (PROVENTIL HFA;VENTOLIN HFA) 108 (90 Base) MCG/ACT inhaler, Inhale 2 puffs every 6 (six) hours as needed into the lungs for wheezing or shortness of breath., Disp: 1 Inhaler, Rfl: 2 .  buPROPion (WELLBUTRIN) 75 MG tablet, Take 75 mg by mouth 2 (two) times daily., Disp: , Rfl:  .  levonorgestrel-ethinyl estradiol (AVIANE,ALESSE,LESSINA) 0.1-20 MG-MCG tablet, Patient to taking the oral contraceptive pill continuously and withdrawal every 3 months as instructed (Patient not taking: Reported on 08/22/2017), Disp: 3 Package, Rfl: 4  Review of Systems  Constitutional: Negative.   HENT: Positive for ear pain and sore throat.   Respiratory: Negative.   Cardiovascular: Negative.    Social History   Tobacco Use  . Smoking status: Current Every Day Smoker    Types: E-cigarettes  . Smokeless tobacco: Never Used  Substance Use Topics  . Alcohol use: Yes    Comment: occassionally   Objective:   BP 102/64 (BP Location: Right Arm, Patient Position: Sitting, Cuff  Size: Normal)   Pulse (!) 56   Temp 98.4 F (36.9 C) (Oral)   Wt 163 lb (73.9 kg)   SpO2 98%   BMI 24.78 kg/m   Physical Exam  Constitutional: She is oriented to person, place, and time. She appears well-developed and well-nourished. No distress.  HENT:  Head: Normocephalic and atraumatic.  Right Ear: Hearing and external ear normal.  Left Ear: Hearing normal.  Nose: Nose normal.  Slightly pink to red left TM. No fluid lines noted. No perforations or drainage from ears. Tonsils 3+ enlarged and reddened. No exudates.  Eyes: Conjunctivae and lids are normal. Right eye exhibits no discharge. Left eye exhibits no discharge. No scleral icterus.  Neck: Normal range of motion. Neck supple.  Slightly tender submandibular nodes without enlargement.  Cardiovascular: Normal rate.  Pulmonary/Chest: Effort normal and breath sounds normal. No respiratory distress.  Abdominal: Soft. Bowel sounds are normal.  Musculoskeletal: Normal range of motion.  Neurological: She is alert and oriented to person, place, and time.  Skin: Skin is intact. No lesion and no rash noted.  Psychiatric: She has a normal mood and affect. Her speech is normal and behavior is normal. Thought content normal.      Assessment & Plan:     1. Tonsillitis Onset 2 weeks ago with earaches. History of large tonsils and frequent strep infections. Negative for strep today and no fever. Will treat with Amoxil with inflammation of tonsils  noted. Gargle with warm salt water and recheck if no better in a week. May need referral to ENT with this amount of enlargement being persistent. - POCT rapid strep A - amoxicillin (AMOXIL) 875 MG tablet; Take 1 tablet (875 mg total) by mouth 2 (two) times daily.  Dispense: 20 tablet; Refill: 0  2. Otalgia of both ears Onset the past 2 weeks after a trip to the beach and swimming a lot. No sign of external otitis. Pain in left ear with slight redness. Will treat with Amoxil and may add decongestant  with Nasonex Nasal Spray. Recheck if no better in a week.  3. BCP (birth control pills) initiation LMP 03-12-18 and lasted 4 days (as usual) without heavy flow or cramps of significance. Was on BCP in the Fall of 2018 after having Skyla IUD removed by GYN. Wants to restart BCP. Pregnancy test negative. Warned about possible ovulation the first month (especially with using Amoxicillin). Will refill BCP and reminded her to get PAP this year. - POCT urine pregnancy - levonorgestrel-ethinyl estradiol (AVIANE,ALESSE,LESSINA) 0.1-20 MG-MCG tablet; Patient to taking the oral contraceptive pill continuously and withdrawal every 3 months as instructed  Dispense: 3 Package; Refill: 4  4. TMJ syndrome Has had pain and headaches emanating from TM joints (bilaterally). Dentist has notice wear of teeth and she admits to grinding teeth. Discomfort worse with chewing gum a lot. Has tried oral appliances without much relief. May use hot compresses or ice packs with Ibuprofen prn. Stop frequent gum chewing.      Dortha Kernennis Yahye Siebert, PA  [facility]

## 2018-06-19 ENCOUNTER — Encounter: Payer: Self-pay | Admitting: Family Medicine

## 2018-06-19 ENCOUNTER — Other Ambulatory Visit (HOSPITAL_COMMUNITY)
Admission: RE | Admit: 2018-06-19 | Discharge: 2018-06-19 | Disposition: A | Discharge status: Home / Self Care | Payer: BLUE CROSS/BLUE SHIELD | Source: Ambulatory Visit | Attending: Family Medicine | Admitting: Family Medicine

## 2018-06-19 ENCOUNTER — Ambulatory Visit (INDEPENDENT_AMBULATORY_CARE_PROVIDER_SITE_OTHER): Payer: BLUE CROSS/BLUE SHIELD | Admitting: Family Medicine

## 2018-06-19 VITALS — BP 102/78 | HR 81 | Temp 98.5°F | Wt 155.4 lb

## 2018-06-19 DIAGNOSIS — Z114 Encounter for screening for human immunodeficiency virus [HIV]: Secondary | ICD-10-CM | POA: Diagnosis not present

## 2018-06-19 DIAGNOSIS — N926 Irregular menstruation, unspecified: Secondary | ICD-10-CM | POA: Diagnosis not present

## 2018-06-19 DIAGNOSIS — Z113 Encounter for screening for infections with a predominantly sexual mode of transmission: Secondary | ICD-10-CM | POA: Insufficient documentation

## 2018-06-19 LAB — POCT URINE PREGNANCY: Preg Test, Ur: NEGATIVE

## 2018-06-19 NOTE — Patient Instructions (Signed)

## 2018-06-19 NOTE — Progress Notes (Signed)
Patient: Cheryl Morgan Female    DOB: 1996-01-23   22 y.o.   MRN: [id number] Visit Date: 06/19/2018  Today's Provider: Shirlee Latch, MD   Chief Complaint  Patient presents with  . Amenorrhea   Subjective:    I, [name], CMA, am acting as a scribe for Shirlee Latch, MD.   HPI Patient states she took a Plan B on July 30 th 2019. She reports her menstrual cycle was due on 06/09/2018. She states she is late. She has took a home pregnancy test, and the results negative. Patient reports white vaginal discharge for approximately 3 weeks, cramping episodes and back pain the past week.     LMP 05/09/18.  Sexually active with broken condom Cramps for last few days  No Known Allergies   Current Outpatient Medications:  .  albuterol (PROVENTIL HFA;VENTOLIN HFA) 108 (90 Base) MCG/ACT inhaler, Inhale 2 puffs every 6 (six) hours as needed into the lungs for wheezing or shortness of breath., Disp: 1 Inhaler, Rfl: 2 .  buPROPion (WELLBUTRIN) 75 MG tablet, Take 75 mg by mouth 2 (two) times daily., Disp: , Rfl:  .  levonorgestrel-ethinyl estradiol (AVIANE,ALESSE,LESSINA) 0.1-20 MG-MCG tablet, Patient to taking the oral contraceptive pill continuously and withdrawal every 3 months as instructed (Patient not taking: Reported on 06/19/2018), Disp: 3 Package, Rfl: 4   Review of Systems  Constitutional: Negative.   Respiratory: Negative.   Cardiovascular: Negative.   Genitourinary: Positive for vaginal discharge.       Amenorrhea  Musculoskeletal: Positive for back pain.    Social History   Tobacco Use  . Smoking status: Current Every Day Smoker    Types: E-cigarettes  . Smokeless tobacco: Never Used  Substance Use Topics  . Alcohol use: Yes    Comment: occassionally   Objective:   BP 102/78 (BP Location: Right Arm, Patient Position: Sitting, Cuff Size: Normal)   Pulse 81   Temp 98.5 F (36.9 C) (Oral)   Wt 155 lb 6.4 oz (70.5 kg)   SpO2 96%   BMI 23.63 kg/m    Vitals:   06/19/18 1127  BP: 102/78  Pulse: 81  Temp: 98.5 F (36.9 C)  TempSrc: Oral  SpO2: 96%  Weight: 155 lb 6.4 oz (70.5 kg)     Physical Exam  Constitutional: She is oriented to person, place, and time. She appears well-developed and well-nourished. No distress.  HENT:  Head: Normocephalic and atraumatic.  Eyes: Conjunctivae are normal. No scleral icterus.  Cardiovascular: Normal rate and regular rhythm.  Pulmonary/Chest: Effort normal. No respiratory distress.  Abdominal: Soft. She exhibits no distension. There is no tenderness.  Genitourinary:  Genitourinary Comments: GYN:  External genitalia within normal limits.  Vaginal mucosa pink, moist, normal rugae.  Nonfriable cervix without lesions, no bleeding, + thin, white discharge noted on speculum exam.  Bimanual exam revealed normal, nongravid uterus.  No cervical motion tenderness. No adnexal masses bilaterally.     Musculoskeletal: She exhibits no edema.  Neurological: She is alert and oriented to person, place, and time.  Skin: Skin is warm and dry. Capillary refill takes less than 2 seconds. No rash noted.  Psychiatric: She has a normal mood and affect. Her behavior is normal.  Vitals reviewed.    Results for orders placed or performed in visit on 06/19/18  POCT urine pregnancy  Result Value Ref Range   Preg Test, Ur Negative Negative       Assessment & Plan:  1. Menstrual period late - likely 2/2 Plan B treatment - reassured that pregnancy test is negative - return precautions discussed - POCT urine pregnancy negative  2. Screen for STD (sexually transmitted disease) - will test for GC/CT, Trich, BV, yeast - blood work for HIV, RPR - HIV antibody (with reflex) - RPR  3. Screening for HIV (human immunodeficiency virus) - HIV antibody (with reflex)   Return if symptoms worsen or fail to improve.   The entirety of the information documented in the History of Present Illness, Review of Systems and  Physical Exam were personally obtained by me. Portions of this information were initially documented by [name], CMA and reviewed by me for thoroughness and accuracy.    Cheryl Downer, MD, MPH [facility] 06/19/2018 11:47 AM

## 2018-06-20 ENCOUNTER — Telehealth: Payer: Self-pay

## 2018-06-20 LAB — CERVICOVAGINAL ANCILLARY ONLY
BACTERIAL VAGINITIS: NEGATIVE
CANDIDA VAGINITIS: NEGATIVE
Chlamydia: POSITIVE — AB
Neisseria Gonorrhea: NEGATIVE
TRICH (WINDOWPATH): NEGATIVE

## 2018-06-20 LAB — RPR: RPR Ser Ql: NONREACTIVE

## 2018-06-20 LAB — HIV ANTIBODY (ROUTINE TESTING W REFLEX): HIV Screen 4th Generation wRfx: NONREACTIVE

## 2018-06-20 NOTE — Telephone Encounter (Signed)
LMTCB  Thanks,  -Joseline 

## 2018-06-20 NOTE — Telephone Encounter (Signed)
Pt calling back for lab results from 06/19/18

## 2018-06-20 NOTE — Telephone Encounter (Signed)
Patient advised.

## 2018-06-20 NOTE — Telephone Encounter (Signed)
-----   Message from Erasmo Downer, MD sent at 06/20/2018  9:38 AM EDT ----- Negative HIV and syphilis.  Other tests pending  Erasmo Downer, MD, MPH [facility] 06/20/2018 9:38 AM

## 2018-06-21 ENCOUNTER — Telehealth: Payer: Self-pay

## 2018-06-21 NOTE — Telephone Encounter (Signed)
LMTCB

## 2018-06-21 NOTE — Telephone Encounter (Signed)
-----   Message from Erasmo Downer, MD sent at 06/21/2018 11:31 AM EDT ----- Vaginal swab is negative for BV, yeast, gonorrhea, trichomonas.  She is positive for chlamydia.  This is treated with azithromycin 1 g p.o. x1 dose.  We can prescribe this and send it to her pharmacy.  She should notify any and all partners as they will need to be tested and treated as well.  No unprotected sex until at least 7 days after she and her partner have been treated adequately.  We will need to fill out the reportable disease form to send to the health department as well.  She should follow-up in 3 months for retesting to ensure this is cleared.  Erasmo Downer, MD, MPH [facility] 06/21/2018 11:31 AM

## 2018-06-24 ENCOUNTER — Other Ambulatory Visit: Payer: Self-pay | Admitting: Family Medicine

## 2018-06-24 DIAGNOSIS — Z30011 Encounter for initial prescription of contraceptive pills: Secondary | ICD-10-CM

## 2018-06-25 NOTE — Telephone Encounter (Signed)
NA. LMTCB 

## 2018-06-26 MED ORDER — AZITHROMYCIN 500 MG PO TABS: 1000.0000 mg | ORAL_TABLET | Freq: Once | ORAL | 0 refills | Status: AC

## 2018-06-26 NOTE — Telephone Encounter (Signed)
Patient advised as directed below. She reports that she uses [facility] [address] for her medicine.  FYI:I am on the phones today and went ahead and gave her the results.Hope this is ok.

## 2018-06-26 NOTE — Telephone Encounter (Signed)
Can you send in this RX and make sure that the form for the health department is completed for reportable diseases?  Erasmo Downer, MD, MPH [facility] 06/26/2018 11:26 AM

## 2018-06-26 NOTE — Telephone Encounter (Signed)
RX sent to [facility]. Form completed and faxed to local Health Dept.

## 2018-07-02 ENCOUNTER — Other Ambulatory Visit: Payer: Self-pay | Admitting: Family Medicine

## 2018-07-02 DIAGNOSIS — Z30011 Encounter for initial prescription of contraceptive pills: Secondary | ICD-10-CM

## 2018-07-02 MED ORDER — LEVONORGESTREL-ETHINYL ESTRAD 0.1-20 MG-MCG PO TABS: ORAL_TABLET | ORAL | 4 refills | Status: DC

## 2018-07-02 NOTE — Telephone Encounter (Signed)
[facility] faxed a refill request for the following medication. Thanks CC  levonorgestrel-ethinyl estradiol (AVIANE,ALESSE,LESSINA) 0.1-20 MG-MCG tablet

## 2018-07-02 NOTE — Addendum Note (Signed)
Addended by: Hyacinth MeekerIMAS, Sharnetta Gielow S on: 07/02/2018 04:00 PM   Modules accepted: Orders

## 2018-09-19 ENCOUNTER — Ambulatory Visit (INDEPENDENT_AMBULATORY_CARE_PROVIDER_SITE_OTHER): Payer: BLUE CROSS/BLUE SHIELD | Admitting: Family Medicine

## 2018-09-19 ENCOUNTER — Encounter: Payer: Self-pay | Admitting: Family Medicine

## 2018-09-19 ENCOUNTER — Other Ambulatory Visit (HOSPITAL_COMMUNITY)
Admission: RE | Admit: 2018-09-19 | Discharge: 2018-09-19 | Disposition: A | Discharge status: Home / Self Care | Payer: BLUE CROSS/BLUE SHIELD | Source: Ambulatory Visit | Attending: Family Medicine | Admitting: Family Medicine

## 2018-09-19 VITALS — BP 106/66 | HR 69 | Temp 98.4°F | Wt 160.8 lb

## 2018-09-19 DIAGNOSIS — R300 Dysuria: Secondary | ICD-10-CM

## 2018-09-19 DIAGNOSIS — Z8619 Personal history of other infectious and parasitic diseases: Secondary | ICD-10-CM | POA: Insufficient documentation

## 2018-09-19 DIAGNOSIS — N898 Other specified noninflammatory disorders of vagina: Secondary | ICD-10-CM

## 2018-09-19 DIAGNOSIS — Z113 Encounter for screening for infections with a predominantly sexual mode of transmission: Secondary | ICD-10-CM | POA: Insufficient documentation

## 2018-09-19 NOTE — Progress Notes (Signed)
Patient: Cheryl Morgan Female    DOB: Feb 02, 1996   22 y.o.   MRN: [id number] Visit Date: 09/19/2018  Today's Provider: Shirlee Latch, MD   Chief Complaint  Patient presents with  . Exposure to STD   Subjective:    Exposure to STD   The patient's primary symptoms include a discharge and dysuria. The patient's pertinent negatives include no genital itching, genital lesions, genital rash or pelvic pain. This is a recurrent problem. Episode onset: 1 week ago. The problem has been gradually worsening. The vaginal discharge was white, yellow and thin. She has tried nothing for the symptoms. Risk factors include history of STDs.   She did improve after treatment of chlamydia in 06/2018.  States partner was tested and treated as well.  Currently menstruating.  Does not feel like UTI.  No urinary frequency, urgency, fever, abd pain.  No new sexual partners, but she is not sure if her partner has had any other sexual partners    No Known Allergies   Current Outpatient Medications:  .  albuterol (PROVENTIL HFA;VENTOLIN HFA) 108 (90 Base) MCG/ACT inhaler, Inhale 2 puffs every 6 (six) hours as needed into the lungs for wheezing or shortness of breath., Disp: 1 Inhaler, Rfl: 2 .  buPROPion (WELLBUTRIN) 75 MG tablet, Take 75 mg by mouth 2 (two) times daily., Disp: , Rfl:  .  levonorgestrel-ethinyl estradiol (AVIANE,ALESSE,LESSINA) 0.1-20 MG-MCG tablet, Patient to taking the oral contraceptive pill continuously and withdrawal every 3 months as instructed, Disp: 3 Package, Rfl: 4  Review of Systems  Constitutional: Negative.   Respiratory: Negative.   Cardiovascular: Negative.   Genitourinary: Positive for dysuria. Negative for pelvic pain.  Musculoskeletal: Negative.     Social History   Tobacco Use  . Smoking status: Current Every Day Smoker    Types: E-cigarettes  . Smokeless tobacco: Never Used  Substance Use Topics  . Alcohol use: Yes    Comment: occassionally    Objective:   BP 106/66 (BP Location: Right Arm, Patient Position: Sitting, Cuff Size: Normal)   Pulse 69   Temp 98.4 F (36.9 C) (Oral)   Wt 160 lb 12.8 oz (72.9 kg)   SpO2 99%   BMI 24.45 kg/m  Vitals:   09/19/18 1107  BP: 106/66  Pulse: 69  Temp: 98.4 F (36.9 C)  TempSrc: Oral  SpO2: 99%  Weight: 160 lb 12.8 oz (72.9 kg)     Physical Exam  Constitutional: She is oriented to person, place, and time. She appears well-developed and well-nourished. No distress.  HENT:  Head: Normocephalic and atraumatic.  Eyes: Conjunctivae are normal. No scleral icterus.  Neck: Neck supple. No thyromegaly present.  Cardiovascular: Normal rate and regular rhythm.  Pulmonary/Chest: Effort normal. No respiratory distress.  Abdominal: Soft. She exhibits no distension. There is no tenderness. There is no rebound and no guarding.  Genitourinary:  Genitourinary Comments: GYN:  External genitalia within normal limits.  Vaginal mucosa pink, moist, normal rugae.  Nonfriable cervix without lesions, no discharge, +bleeding noted on speculum exam.  Bimanual exam revealed normal, nongravid uterus.  No cervical motion tenderness. No adnexal masses bilaterally.    Musculoskeletal: She exhibits no edema.  Lymphadenopathy:    She has no cervical adenopathy.  Neurological: She is alert and oriented to person, place, and time.  Skin: Skin is warm and dry. Capillary refill takes less than 2 seconds. No rash noted.  Psychiatric: She has a normal mood and affect. Her behavior  is normal.  Vitals reviewed.       Assessment & Plan:   1. Vaginal discharge 2. Dysuria 3. History of sexually transmitted disease 4. Screen for STD (sexually transmitted disease) - new problem within last week, but h/o Chlamydia in 06/2018 and cancelled appt for recheck - no symptoms of UTI and Urine sample has a lot of menstrual blood, so will not do UA today - discussed return precautions - will screen for STDs and BV/yeast -  will treat as indicated pending results - discussed safe sex - HIV antibody (with reflex) - RPR - Cervicovaginal ancillary only    Return if symptoms worsen or fail to improve.   The entirety of the information documented in the History of Present Illness, Review of Systems and Physical Exam were personally obtained by me. Portions of this information were initially documented by Presley RaddleNikki Walston, CMA and reviewed by me for thoroughness and accuracy.    Erasmo DownerBacigalupo, Nalayah M, MD, MPH [facility] 09/19/2018 12:15 PM

## 2018-09-19 NOTE — Patient Instructions (Signed)

## 2018-09-20 LAB — CERVICOVAGINAL ANCILLARY ONLY
BACTERIAL VAGINITIS: NEGATIVE
Candida vaginitis: NEGATIVE
Chlamydia: NEGATIVE
Neisseria Gonorrhea: NEGATIVE
Trichomonas: NEGATIVE

## 2018-09-20 LAB — RPR: RPR Ser Ql: NONREACTIVE

## 2018-09-20 LAB — HIV ANTIBODY (ROUTINE TESTING W REFLEX): HIV Screen 4th Generation wRfx: NONREACTIVE

## 2018-11-13 ENCOUNTER — Ambulatory Visit (INDEPENDENT_AMBULATORY_CARE_PROVIDER_SITE_OTHER): Payer: BLUE CROSS/BLUE SHIELD | Admitting: Physician Assistant

## 2018-11-13 ENCOUNTER — Encounter: Payer: Self-pay | Admitting: Physician Assistant

## 2018-11-13 VITALS — BP 119/80 | HR 76 | Temp 98.5°F | Wt 157.4 lb

## 2018-11-13 DIAGNOSIS — H109 Unspecified conjunctivitis: Secondary | ICD-10-CM | POA: Diagnosis not present

## 2018-11-13 MED ORDER — OFLOXACIN 0.3 % OP SOLN: 1.0000 [drp] | Freq: Four times a day (QID) | OPHTHALMIC | 0 refills | Status: AC

## 2018-11-13 NOTE — Patient Instructions (Signed)
Bacterial Conjunctivitis, Adult  Bacterial conjunctivitis is an infection of your conjunctiva. This is the clear membrane that covers the white part of your eye and the inner part of your eyelid. This infection can make your eye:  · Red or pink.  · Itchy.  This condition spreads easily from person to person (is contagious) and from one eye to the other eye.  What are the causes?  · This condition is caused by germs (bacteria). You may get the infection if you come into close contact with:  ? A person who has the infection.  ? Items that have germs on them (are contaminated), such as face towels, contact lens solution, or eye makeup.  What increases the risk?  You are more likely to get this condition if you:  · Have contact with people who have the infection.  · Wear contact lenses.  · Have a sinus infection.  · Have had a recent eye injury or surgery.  · Have a weak body defense system (immune system).  · Have dry eyes.  What are the signs or symptoms?    · Thick, yellowish discharge from the eye.  · Tearing or watery eyes.  · Itchy eyes.  · Burning feeling in your eyes.  · Eye redness.  · Swollen eyelids.  · Blurred vision.  How is this treated?    · Antibiotic eye drops or ointment.  · Antibiotic medicine taken by mouth. This is used for infections that do not get better with drops or ointment or that last more than 10 days.  · Cool, wet cloths placed on the eyes.  · Artificial tears used 2-6 times a day.  Follow these instructions at home:  Medicines  · Take or apply your antibiotic medicine as told by your doctor. Do not stop taking or applying the antibiotic even if you start to feel better.  · Take or apply over-the-counter and prescription medicines only as told by your doctor.  · Do not touch your eyelid with the eye-drop bottle or the ointment tube.  Managing discomfort  · Wipe any fluid from your eye with a warm, wet washcloth or a cotton ball.  · Place a clean, cool, wet cloth on your eye. Do this for  10-20 minutes, 3-4 times per day.  General instructions  · Do not wear contacts until the infection is gone. Wear glasses until your doctor says it is okay to wear contacts again.  · Do not wear eye makeup until the infection is gone. Throw away old eye makeup.  · Change or wash your pillowcase every day.  · Do not share towels or washcloths.  · Wash your hands often with soap and water. Use paper towels to dry your hands.  · Do not touch or rub your eyes.  · Do not drive or use heavy machinery if your vision is blurred.  Contact a doctor if:  · You have a fever.  · You do not get better after 10 days.  Get help right away if:  · You have a fever and your symptoms get worse all of a sudden.  · You have very bad pain when you move your eye.  · Your face:  ? Hurts.  ? Is red.  ? Is swollen.  · You have sudden loss of vision.  Summary  · Bacterial conjunctivitis is an infection of your conjunctiva.  · This infection spreads easily from person to person.  · Wash your hands often   with soap and water. Use paper towels to dry your hands.  · Take or apply your antibiotic medicine as told by your doctor.  · Contact a doctor if you have a fever or you do not get better after 10 days.  This information is not intended to replace advice given to you by your health care provider. Make sure you discuss any questions you have with your health care provider.  Document Released: 07/12/2008 Document Revised: 05/09/2018 Document Reviewed: 05/09/2018  Elsevier Interactive Patient Education © 2019 Elsevier Inc.

## 2018-11-13 NOTE — Progress Notes (Signed)
Patient: Cheryl Morgan Female    DOB: 09/09/1996   23 y.o.   MRN: [id number] Visit Date: 11/14/2018  Today's Provider: Trey SailorsAdriana M Pollak, PA-C   Chief Complaint  Patient presents with  . Eye Problem   Subjective:     Eye Problem   The right eye is affected. This is a new problem. The problem occurs constantly. The problem has been unchanged. The pain is at a severity of 3/10. The pain is mild. There is no known exposure to pink eye. She does not wear contacts. Associated symptoms include an eye discharge, eye redness and itching. Pertinent negatives include no blurred vision.   She put on glitter makeup yesterday and woke up today with the above symptoms. Reports slight photophobia when driving, no history of autoimmune disease and no history of iritis.  No Known Allergies   Current Outpatient Medications:  .  albuterol (PROVENTIL HFA;VENTOLIN HFA) 108 (90 Base) MCG/ACT inhaler, Inhale 2 puffs every 6 (six) hours as needed into the lungs for wheezing or shortness of breath., Disp: 1 Inhaler, Rfl: 2 .  buPROPion (WELLBUTRIN) 75 MG tablet, Take 75 mg by mouth 2 (two) times daily., Disp: , Rfl:  .  levonorgestrel-ethinyl estradiol (AVIANE,ALESSE,LESSINA) 0.1-20 MG-MCG tablet, Patient to taking the oral contraceptive pill continuously and withdrawal every 3 months as instructed, Disp: 3 Package, Rfl: 4 .  ofloxacin (OCUFLOX) 0.3 % ophthalmic solution, Place 1 drop into the right eye 4 (four) times daily for 7 days., Disp: 1.4 mL, Rfl: 0  Review of Systems  Eyes: Positive for discharge, redness and itching. Negative for blurred vision.    Social History   Tobacco Use  . Smoking status: Current Every Day Smoker    Types: E-cigarettes  . Smokeless tobacco: Never Used  Substance Use Topics  . Alcohol use: Yes    Comment: occassionally      Objective:   BP 119/80 (BP Location: Right Arm, Patient Position: Sitting, Cuff Size: Normal)   Pulse 76   Temp 98.5 F (36.9 C)  (Oral)   Wt 157 lb 6.4 oz (71.4 kg)   SpO2 96%   BMI 23.93 kg/m  Vitals:   11/13/18 1416  BP: 119/80  Pulse: 76  Temp: 98.5 F (36.9 C)  TempSrc: Oral  SpO2: 96%  Weight: 157 lb 6.4 oz (71.4 kg)     Physical Exam Constitutional:      Appearance: Normal appearance.  Eyes:     General:        Right eye: Discharge present.        Left eye: No discharge.     Conjunctiva/sclera:     Right eye: Right conjunctiva is injected. Exudate present. No chemosis or hemorrhage.    Left eye: Left conjunctiva is not injected. No chemosis, exudate or hemorrhage.    Pupils: Pupils are equal, round, and reactive to light.  Neurological:     Mental Status: She is alert.         Assessment & Plan    1. Bacterial conjunctivitis  Treat as below. Call back if not improving or worsening, will need opthalmology referral.   - ofloxacin (OCUFLOX) 0.3 % ophthalmic solution; Place 1 drop into the right eye 4 (four) times daily for 7 days.  Dispense: 1.4 mL; Refill: 0  The entirety of the information documented in the History of Present Illness, Review of Systems and Physical Exam were personally obtained by me. Portions of this information were  initially documented by [facility], CMA and reviewed by me for thoroughness and accuracy.   Return if symptoms worsen or fail to improve.      Trey Sailors, PA-C  [facility]

## 2018-11-14 ENCOUNTER — Telehealth: Payer: Self-pay | Admitting: Physician Assistant

## 2018-11-14 NOTE — Telephone Encounter (Signed)
Pt is calling to Ricki Rodriguez know her eye has gone down some.  It's still itchy and very red on the inside.  Please advise.  Thanks, [employer]

## 2018-11-14 NOTE — Telephone Encounter (Signed)
If it's gone down, that's a good sign of improvement and it will likely continue to improve over the next few days. Definitely call us back if it stops improving or gets worse. If she needs extended work note I can send it through Lena if she has that.

## 2018-11-15 ENCOUNTER — Ambulatory Visit (INDEPENDENT_AMBULATORY_CARE_PROVIDER_SITE_OTHER): Payer: BLUE CROSS/BLUE SHIELD | Admitting: Physician Assistant

## 2018-11-15 ENCOUNTER — Encounter: Payer: Self-pay | Admitting: Physician Assistant

## 2018-11-15 VITALS — BP 100/60 | Temp 98.4°F | Resp 16 | Wt 157.0 lb

## 2018-11-15 DIAGNOSIS — R110 Nausea: Secondary | ICD-10-CM

## 2018-11-15 DIAGNOSIS — R109 Unspecified abdominal pain: Secondary | ICD-10-CM | POA: Diagnosis not present

## 2018-11-15 DIAGNOSIS — M791 Myalgia, unspecified site: Secondary | ICD-10-CM | POA: Diagnosis not present

## 2018-11-15 DIAGNOSIS — R5383 Other fatigue: Secondary | ICD-10-CM | POA: Diagnosis not present

## 2018-11-15 DIAGNOSIS — F419 Anxiety disorder, unspecified: Secondary | ICD-10-CM | POA: Diagnosis not present

## 2018-11-15 MED ORDER — PROMETHAZINE HCL 12.5 MG PO TABS: 12.5000 mg | ORAL_TABLET | Freq: Three times a day (TID) | ORAL | 0 refills | Status: DC | PRN

## 2018-11-15 MED ORDER — SERTRALINE HCL 50 MG PO TABS: 50.0000 mg | ORAL_TABLET | Freq: Every day | ORAL | 0 refills | Status: DC

## 2018-11-15 NOTE — Telephone Encounter (Signed)
Patient advised as below.  

## 2018-11-15 NOTE — Progress Notes (Signed)
Patient: Cheryl Morgan Female    DOB: 04/13/1996   23 y.o.   MRN: [id number] Visit Date: 11/15/2018  Today's Provider: Trey SailorsAdriana M Pollak, PA-C   Chief Complaint  Patient presents with  . Generalized Body Aches   Subjective:     HPI Patient here today c/o body ache feels like body is "tight" since last night. Patient reports that she feels like she cant get a deep breath. Patient denies any shortness of breath. Patient reports pain is around breast and back area.   Says her back gets very tight when something serious is about to go down in her body. Never had chicken pox has gotten the vaccine. Denies diarrhea, nausea, vomiting. Denies pelvic pain, vaginal discharge, vaginal bleeding, new sexual partners. Denies cough. Not on birth control currently.   Reports anxiety, high levels of stress. Worse when she goes to sleep, has racing thoughts. Interferes with daily life.   No Known Allergies   Current Outpatient Medications:  .  albuterol (PROVENTIL HFA;VENTOLIN HFA) 108 (90 Base) MCG/ACT inhaler, Inhale 2 puffs every 6 (six) hours as needed into the lungs for wheezing or shortness of breath., Disp: 1 Inhaler, Rfl: 2 .  buPROPion (WELLBUTRIN) 75 MG tablet, Take 75 mg by mouth 2 (two) times daily., Disp: , Rfl:  .  levonorgestrel-ethinyl estradiol (AVIANE,ALESSE,LESSINA) 0.1-20 MG-MCG tablet, Patient to taking the oral contraceptive pill continuously and withdrawal every 3 months as instructed, Disp: 3 Package, Rfl: 4 .  ofloxacin (OCUFLOX) 0.3 % ophthalmic solution, Place 1 drop into the right eye 4 (four) times daily for 7 days., Disp: 1.4 mL, Rfl: 0  Review of Systems  Constitutional: Positive for activity change, appetite change, chills and fatigue.  Eyes: Positive for redness.  Respiratory: Positive for chest tightness.   Musculoskeletal: Positive for back pain and myalgias.    Social History   Tobacco Use  . Smoking status: Current Every Day Smoker    Types:  E-cigarettes  . Smokeless tobacco: Never Used  Substance Use Topics  . Alcohol use: Yes    Comment: occassionally      Objective:   BP 100/60 (BP Location: Left Arm, Patient Position: Sitting, Cuff Size: Normal)   Temp 98.4 F (36.9 C) (Oral)   Resp 16   Wt 157 lb (71.2 kg)   SpO2 96%   BMI 23.87 kg/m  Vitals:   11/15/18 1439  BP: 100/60  Resp: 16  Temp: 98.4 F (36.9 C)  TempSrc: Oral  SpO2: 96%  Weight: 157 lb (71.2 kg)     Physical Exam Constitutional:      Appearance: Normal appearance.  Cardiovascular:     Rate and Rhythm: Normal rate and regular rhythm.     Heart sounds: Normal heart sounds.  Pulmonary:     Effort: Pulmonary effort is normal.     Breath sounds: Normal breath sounds.  Abdominal:     Palpations: Abdomen is soft.  Neurological:     Mental Status: She is alert and oriented to person, place, and time. Mental status is at baseline.  Psychiatric:        Mood and Affect: Mood normal.        Behavior: Behavior normal.         Assessment & Plan    1. Fatigue, unspecified type  - CBC with Differential - Comprehensive Metabolic Panel (CMET) - B. burgdorfi antibodies - Rocky mtn spotted fvr abs pnl(IgG+IgM)  2. Myalgia  - CBC  with Differential - B. burgdorfi antibodies - Rocky mtn spotted fvr abs pnl(IgG+IgM)  3. Nausea  - promethazine (PHENERGAN) 12.5 MG tablet; Take 1 tablet (12.5 mg total) by mouth every 8 (eight) hours as needed for nausea or vomiting.  Dispense: 20 tablet; Refill: 0  4. Anxiety  - sertraline (ZOLOFT) 50 MG tablet; Take 1 tablet (50 mg total) by mouth daily.  Dispense: 90 tablet; Refill: 0  5. Abdominal pain, unspecified abdominal location  - POCT urinalysis dipstick - Urine Culture  Return in about 6 weeks (around 12/27/2018) for anxiety .  The entirety of the information documented in the History of Present Illness, Review of Systems and Physical Exam were personally obtained by me. Portions of this  information were initially documented by Rondel Baton, CMA and reviewed by me for thoroughness and accuracy.       Trey Sailors, PA-C  [facility]

## 2018-11-16 ENCOUNTER — Other Ambulatory Visit: Payer: Self-pay | Admitting: Physician Assistant

## 2018-11-16 ENCOUNTER — Telehealth: Payer: Self-pay

## 2018-11-16 DIAGNOSIS — R300 Dysuria: Secondary | ICD-10-CM

## 2018-11-16 LAB — POCT URINALYSIS DIPSTICK
Bilirubin, UA: NEGATIVE
Blood, UA: NEGATIVE
Glucose, UA: NEGATIVE
Ketones, UA: POSITIVE
Leukocytes, UA: NEGATIVE
Nitrite, UA: NEGATIVE
Protein, UA: POSITIVE — AB
Spec Grav, UA: 1.01 (ref 1.010–1.025)
Urobilinogen, UA: 0.2 E.U./dL
pH, UA: 6 (ref 5.0–8.0)

## 2018-11-16 MED ORDER — SULFAMETHOXAZOLE-TRIMETHOPRIM 800-160 MG PO TABS: 1.0000 | ORAL_TABLET | Freq: Two times a day (BID) | ORAL | 0 refills | Status: AC

## 2018-11-16 NOTE — Telephone Encounter (Signed)
Patient reports she is feeling worse. Will come in and leave a urine sample.

## 2018-11-16 NOTE — Telephone Encounter (Signed)
-----   Message from Trey SailorsAdriana M Pollak, [place]-C sent at 11/16/2018  9:27 AM EST ----- White count is elevated slightly in a way that shows bacterial infection. I am not sure what is causing this as she didn't have any diarrhea or other symptoms to point me in the direction of a specific infection. Her metabolic panel is normal and lime titers are pending. How is she feeling today? One thing I didn't check is a urine sample. If she is feeling worse we can have her drop off urine sample and treat empirically for UTI.

## 2018-11-17 ENCOUNTER — Other Ambulatory Visit: Payer: Self-pay | Admitting: Physician Assistant

## 2018-11-17 DIAGNOSIS — A770 Spotted fever due to Rickettsia rickettsii: Secondary | ICD-10-CM

## 2018-11-17 MED ORDER — DOXYCYCLINE HYCLATE 100 MG PO TABS: 100.0000 mg | ORAL_TABLET | Freq: Two times a day (BID) | ORAL | 0 refills | Status: AC

## 2018-11-17 NOTE — Progress Notes (Signed)
RMSF test came back positive IgM 1.36. Have contacted patient and will be sending in doxycycline 100 mg twice daily for 7 days.

## 2018-11-18 LAB — URINE CULTURE

## 2018-11-19 ENCOUNTER — Telehealth: Payer: Self-pay

## 2018-11-19 NOTE — Telephone Encounter (Signed)
-----   Message from Trey SailorsAdriana M Pollak, [place]-C sent at 11/16/2018  3:44 PM EST ----- Urinalysis did not show bacteria. However, will send in bactrim one pill twice daily x 5 days if patient worsens over the weekend she can take this. Since she has started to have diarrhea, this may just be a GI illness.

## 2018-11-19 NOTE — Telephone Encounter (Signed)
Noted, thanks!

## 2018-11-19 NOTE — Telephone Encounter (Signed)
Patient advised as below. Patient was seen at [facility] ER. Patient reports she is feeling better. sd

## 2018-11-19 NOTE — Telephone Encounter (Signed)
Pt called saying a nurse called her and left a message for her tocall back.  She does not know what the nurses name was,  Thanks  Barth Kirks

## 2018-11-19 NOTE — Telephone Encounter (Signed)
FYI: Patient called into the [facility] call center on 11/18/2018 @ 8:03PM spoke with Dale Wasco, RN and stated the following " Caller states that she has been diagnosed with [facility] Spotted Fever yesterday. This morning she had some moderate blood in her stool x1. She is having a constant pain in her upper stomach - scaled on a 5/10. No temp. No other symptoms." Patient then went to [facility] emergency department.  I spoke to patient and she states she is feeling much better. But her stool is a little dark and she would notice any bloody stool due to currently been on her menstrual cycle.

## 2018-11-20 LAB — LYME, WESTERN BLOT, SERUM (REFLEXED)
IgG P18 Ab.: ABSENT
IgG P23 Ab.: ABSENT
IgG P28 Ab.: ABSENT
IgG P30 Ab.: ABSENT
IgG P39 Ab.: ABSENT
IgG P41 Ab.: ABSENT
IgG P45 Ab.: ABSENT
IgG P58 Ab.: ABSENT
IgG P66 Ab.: ABSENT
IgG P93 Ab.: ABSENT
IgM P39 Ab.: ABSENT
IgM P41 Ab.: ABSENT
Lyme IgG Wb: NEGATIVE
Lyme IgM Wb: NEGATIVE

## 2018-11-20 LAB — COMPREHENSIVE METABOLIC PANEL
ALT: 7 IU/L (ref 0–32)
AST: 14 IU/L (ref 0–40)
Albumin/Globulin Ratio: 1.9 (ref 1.2–2.2)
Albumin: 4.7 g/dL (ref 3.9–5.0)
Alkaline Phosphatase: 78 IU/L (ref 39–117)
BUN/Creatinine Ratio: 12 (ref 9–23)
BUN: 8 mg/dL (ref 6–20)
Bilirubin Total: 0.7 mg/dL (ref 0.0–1.2)
CO2: 23 mmol/L (ref 20–29)
Calcium: 9.5 mg/dL (ref 8.7–10.2)
Chloride: 98 mmol/L (ref 96–106)
Creatinine, Ser: 0.69 mg/dL (ref 0.57–1.00)
GFR calc Af Amer: 142 mL/min/{1.73_m2} (ref >59)
GFR calc non Af Amer: 123 mL/min/{1.73_m2} (ref >59)
Globulin, Total: 2.5 g/dL (ref 1.5–4.5)
Glucose: 90 mg/dL (ref 65–99)
Potassium: 4 mmol/L (ref 3.5–5.2)
Sodium: 137 mmol/L (ref 134–144)
Total Protein: 7.2 g/dL (ref 6.0–8.5)

## 2018-11-20 LAB — CBC WITH DIFFERENTIAL/PLATELET
Basophils Absolute: 0.1 10*3/uL (ref 0.0–0.2)
Basos: 1 %
EOS (ABSOLUTE): 0.1 10*3/uL (ref 0.0–0.4)
Eos: 1 %
Hematocrit: 41.8 % (ref 34.0–46.6)
Hemoglobin: 14.7 g/dL (ref 11.1–15.9)
Immature Grans (Abs): 0.1 10*3/uL (ref 0.0–0.1)
Immature Granulocytes: 1 %
Lymphocytes Absolute: 0.9 10*3/uL (ref 0.7–3.1)
Lymphs: 7 %
MCH: 33.3 pg — ABNORMAL HIGH (ref 26.6–33.0)
MCHC: 35.2 g/dL (ref 31.5–35.7)
MCV: 95 fL (ref 79–97)
Monocytes Absolute: 1.3 10*3/uL — ABNORMAL HIGH (ref 0.1–0.9)
Monocytes: 10 %
Neutrophils Absolute: 10.7 10*3/uL — ABNORMAL HIGH (ref 1.4–7.0)
Neutrophils: 80 %
Platelets: 310 10*3/uL (ref 150–450)
RBC: 4.42 x10E6/uL (ref 3.77–5.28)
RDW: 11.5 % — ABNORMAL LOW (ref 11.7–15.4)
WBC: 13 10*3/uL — ABNORMAL HIGH (ref 3.4–10.8)

## 2018-11-20 LAB — ROCKY MTN SPOTTED FVR ABS PNL(IGG+IGM)
RMSF IgG: NEGATIVE
RMSF IgM: 1.36 index — ABNORMAL HIGH (ref 0.00–0.89)

## 2018-11-20 LAB — B. BURGDORFI ANTIBODIES: Lyme IgG/IgM Ab: 1.21 {ISR} — ABNORMAL HIGH (ref 0.00–0.90)

## 2018-11-26 ENCOUNTER — Encounter: Payer: Self-pay | Admitting: Physician Assistant

## 2018-11-28 ENCOUNTER — Ambulatory Visit (INDEPENDENT_AMBULATORY_CARE_PROVIDER_SITE_OTHER): Payer: BLUE CROSS/BLUE SHIELD | Admitting: Physician Assistant

## 2018-11-28 ENCOUNTER — Encounter: Payer: Self-pay | Admitting: Physician Assistant

## 2018-11-28 VITALS — BP 129/88 | HR 84 | Temp 97.7°F | Resp 16 | Wt 153.0 lb

## 2018-11-28 DIAGNOSIS — K2900 Acute gastritis without bleeding: Secondary | ICD-10-CM | POA: Diagnosis not present

## 2018-11-28 DIAGNOSIS — R109 Unspecified abdominal pain: Secondary | ICD-10-CM

## 2018-11-28 MED ORDER — SUCRALFATE 1 G PO TABS: 1.0000 g | ORAL_TABLET | Freq: Three times a day (TID) | ORAL | 0 refills | Status: DC

## 2018-11-28 NOTE — Patient Instructions (Signed)
Gastritis, Adult    Gastritis is swelling (inflammation) of the stomach. Gastritis can develop quickly (acute). It can also develop slowly over time (chronic). It is important to get help for this condition. If you do not get help, your stomach can bleed, and you can get sores (ulcers) in your stomach.  What are the causes?  This condition may be caused by:   Germs that get to your stomach.   Drinking too much alcohol.   Medicines you are taking.   Too much acid in the stomach.   A disease of the intestines or stomach.   Stress.   An allergic reaction.   Crohn's disease.   Some cancer treatments (radiation).  Sometimes the cause of this condition is not known.  What are the signs or symptoms?  Symptoms of this condition include:   Pain in your stomach.   A burning feeling in your stomach.   Feeling sick to your stomach (nauseous).   Throwing up (vomiting).   Feeling too full after you eat.   Weight loss.   Bad breath.   Throwing up blood.   Blood in your poop (stool).  How is this diagnosed?  This condition may be diagnosed with:   Your medical history and symptoms.   A physical exam.   Tests. These can include:  ? Blood tests.  ? Stool tests.  ? A procedure to look inside your stomach (upper endoscopy).  ? A test in which a sample of tissue is taken for testing (biopsy).  How is this treated?  Treatment for this condition depends on what caused it. You may be given:   Antibiotic medicine, if your condition was caused by germs.   H2 blockers and similar medicines, if your condition was caused by too much acid.  Follow these instructions at home:  Medicines   Take over-the-counter and prescription medicines only as told by your doctor.   If you were prescribed an antibiotic medicine, take it as told by your doctor. Do not stop taking it even if you start to feel better.  Eating and drinking     Eat small meals often, instead of large meals.   Avoid foods and drinks that make your symptoms  worse.   Drink enough fluid to keep your pee (urine) pale yellow.  Alcohol use   Do not drink alcohol if:  ? Your doctor tells you not to drink.  ? You are pregnant, may be pregnant, or are planning to become pregnant.   If you drink alcohol:  ? Limit your use to:   0-1 drink a day for women.   0-2 drinks a day for men.  ? Be aware of how much alcohol is in your drink. In the [place] one drink equals one 12 oz bottle of beer (355 mL), one 5 oz glass of wine (148 mL), or one 1 oz glass of hard liquor (44 mL).  General instructions   Talk with your doctor about ways to manage stress. You can exercise or do deep breathing, meditation, or yoga.   Do not smoke or use products that have nicotine or tobacco. If you need help quitting, ask your doctor.   Keep all follow-up visits as told by your doctor. This is important.  Contact a doctor if:   Your symptoms get worse.   Your symptoms go away and then come back.  Get help right away if:   You throw up blood or something that looks like coffee   grounds.   You have black or dark red poop.   You throw up any time you try to drink fluids.   Your stomach pain gets worse.   You have a fever.   You do not feel better after one week.  Summary   Gastritis is swelling (inflammation) of the stomach.   You must get help for this condition. If you do not get help, your stomach can bleed, and you can get sores (ulcers).   This condition is diagnosed with medical history, physical exam, or tests.   You can be treated with medicines for germs or medicines to block too much acid in your stomach.  This information is not intended to replace advice given to you by your health care provider. Make sure you discuss any questions you have with your health care provider.  Document Released: 03/21/2008 Document Revised: 02/20/2018 Document Reviewed: 02/20/2018  Elsevier Interactive Patient Education  2019 Elsevier Inc.

## 2018-11-28 NOTE — Progress Notes (Signed)
Patient: Cheryl Morgan Female    DOB: 06/12/1996   23 y.o.   MRN: [id number] Visit Date: 11/28/2018  Today's Provider: Trey SailorsAdriana M Amari Burnsworth, PA-C   Chief Complaint  Patient presents with  . Follow-up   Subjective:     HPI  Follow up ER visit  Patient was seen in ER for abdominal pain on 11/18/2018. She was treated for; abdominal pain, patient advised of labs very reassuring. They are all within normal limits. Imaging did show some evidence of gallbladder sludge with no evidence of gallbladder stones or any signs of infection. Exam and history are largely consistent with possible gastritis versus ulcers. We will treat as such. Please take medications as prescribed. She was positive for RMSF and was treated with four days of doxycycline but then discontinued it. She does not use anti-inflammatories including naproxen, ibuprofen, aspirin or excedrin products. She does not drink coffee or much soda products. She reports continued "hunger pain" stabbing sensation. She is currently taking protonix but has stopped pepcid after 3 days per ER physician's instructions.    Treatment for this included  aluminum-magnesium hydroxide (MAALOX) 200-200 mg/5 mL suspension  Take 10 mL by mouth every six (6) hours as needed for indigestion. 355 mL  0 11/19/2018 12/19/2018  famotidine (PEPCID) 20 MG tablet  Take 1 tablet (20 mg total) by mouth Two (2) times a day for 2 days. 4 tablet  0 11/19/2018 11/21/2018  pantoprazole (PROTONIX) 20 MG tablet  Take 1 tablet (20 mg total) by mouth daily for 20 days. 20 tablet        She reports excellent compliance with treatment. She reports this condition is Unchanged.  Wt Readings from Last 3 Encounters:  11/28/18 153 lb (69.4 kg)  11/15/18 157 lb (71.2 kg)  11/13/18 157 lb 6.4 oz (71.4 kg)    ------------------------------------------------------------------------------------    No Known Allergies   Current Outpatient Medications:  .   albuterol (PROVENTIL HFA;VENTOLIN HFA) 108 (90 Base) MCG/ACT inhaler, Inhale 2 puffs every 6 (six) hours as needed into the lungs for wheezing or shortness of breath., Disp: 1 Inhaler, Rfl: 2 .  aluminum-magnesium hydroxide 200-200 MG/5ML suspension, Take by mouth., Disp: , Rfl:  .  buPROPion (WELLBUTRIN) 75 MG tablet, Take 75 mg by mouth 2 (two) times daily., Disp: , Rfl:  .  pantoprazole (PROTONIX) 20 MG tablet, Take by mouth., Disp: , Rfl:  .  sertraline (ZOLOFT) 50 MG tablet, Take 1 tablet (50 mg total) by mouth daily., Disp: 90 tablet, Rfl: 0  Review of Systems  Constitutional: Positive for appetite change.  Respiratory: Negative.   Cardiovascular: Negative.   Gastrointestinal: Positive for abdominal pain and nausea.    Social History   Tobacco Use  . Smoking status: Current Every Day Smoker    Types: E-cigarettes  . Smokeless tobacco: Never Used  Substance Use Topics  . Alcohol use: Yes    Comment: occassionally      Objective:   BP 129/88 (BP Location: Left Arm, Patient Position: Sitting, Cuff Size: Normal)   Pulse 84   Temp 97.7 F (36.5 C) (Oral)   Resp 16   Wt 153 lb (69.4 kg)   SpO2 99%   BMI 23.26 kg/m  Vitals:   11/28/18 0833  BP: 129/88  Pulse: 84  Resp: 16  Temp: 97.7 F (36.5 C)  TempSrc: Oral  SpO2: 99%  Weight: 153 lb (69.4 kg)     Physical Exam Constitutional:  Appearance: Normal appearance. She is normal weight.  Cardiovascular:     Rate and Rhythm: Normal rate and regular rhythm.     Heart sounds: Normal heart sounds.  Abdominal:     General: Bowel sounds are normal.     Tenderness: There is abdominal tenderness in the epigastric area.  Skin:    General: Skin is warm and dry.  Neurological:     Mental Status: She is oriented to person, place, and time. Mental status is at baseline.  Psychiatric:        Mood and Affect: Mood normal.        Behavior: Behavior normal.         Assessment & Plan    1. Acute gastritis, presence  of bleeding unspecified, unspecified gastritis type  RUQ ultrasound with biliary sludge but otherwise no gallstones. Explained that serology is not very accurate and breath test would require wash out period of antacids. She prefers to be seen by GI for possible endoscopy and further evaluation. Can continue protonix, can start carafate as below. She did not have any anemia at ER and denies blood in her stool. Advised against use of anti-inflammatories.   - Ambulatory referral to Gastroenterology - sucralfate (CARAFATE) 1 g tablet; Take 1 tablet (1 g total) by mouth 4 (four) times daily -  with meals and at bedtime for 30 days.  Dispense: 120 tablet; Refill: 0  2. Stomach ache  - Ambulatory referral to Gastroenterology  The entirety of the information documented in the History of Present Illness, Review of Systems and Physical Exam were personally obtained by me. Portions of this information were initially documented by Rondel Baton, CMA and reviewed by me for thoroughness and accuracy.      Trey Sailors, PA-C  [facility]

## 2018-12-20 ENCOUNTER — Other Ambulatory Visit: Payer: Self-pay | Admitting: Physician Assistant

## 2018-12-20 DIAGNOSIS — K2900 Acute gastritis without bleeding: Secondary | ICD-10-CM

## 2018-12-20 NOTE — Telephone Encounter (Signed)
Last filled 11/28/18

## 2018-12-26 ENCOUNTER — Ambulatory Visit (INDEPENDENT_AMBULATORY_CARE_PROVIDER_SITE_OTHER): Payer: BLUE CROSS/BLUE SHIELD | Admitting: Gastroenterology

## 2018-12-26 ENCOUNTER — Other Ambulatory Visit: Payer: Self-pay

## 2018-12-26 ENCOUNTER — Telehealth: Payer: Self-pay | Admitting: Gastroenterology

## 2018-12-26 ENCOUNTER — Encounter: Payer: Self-pay | Admitting: Gastroenterology

## 2018-12-26 VITALS — BP 127/82 | HR 79 | Resp 17 | Wt 160.6 lb

## 2018-12-26 DIAGNOSIS — K625 Hemorrhage of anus and rectum: Secondary | ICD-10-CM

## 2018-12-26 DIAGNOSIS — R1013 Epigastric pain: Secondary | ICD-10-CM

## 2018-12-26 NOTE — Progress Notes (Signed)
Arlyss Repress, MD [address]  [address]  [place], [place] [address]  Main: [phone]  Fax: [phone]    Gastroenterology Consultation  Referring Provider:     Maryella Shivers Primary Care Physician:  Margaretann Loveless, PA-C Primary Gastroenterologist:  Dr. Arlyss Repress Reason for Consultation:     Epigastric pain, bloating, rectal bleeding        HPI:   Cheryl Morgan is a 23 y.o. female referred by Dr. Margaretann Loveless, PA-C  for consultation & management of 1 month history of epigastric pain, describes as " hunger pains " associated with bloating, loose stools.  She tried Pepcid as needed.  She also noticed one episode of rectal bleeding after bowel movement, painless, describes it as bright red blood per rectum which prompted her to go to ER about a month ago to [facility]. Due to worsening of abdominal pain, underwent ultrasound right upper quadrant which revealed mild sludge in the gallbladder, LFTs, CBC normal, lipase normal.  She is not taking sucralfate or Protonix.  She reports weight loss of about 10 pounds which regained it back, she denies loss of appetite. Drinks alcohol socially, vapes regularly  NSAIDs: None  Antiplts/Anticoagulants/Anti thrombotics: None  GI Procedures: None She denies family history of GI malignancy She does not have any GI surgeries  Past Medical History:  Diagnosis Date  . Seasonal allergies     Past Surgical History:  Procedure Laterality Date  . MOUTH SURGERY    . 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  buPROPion (WELLBUTRIN) 75 MG tablet, Take 75 mg by mouth 2 (two) times daily., Disp: , Rfl:  .  sertraline (ZOLOFT) 50 MG tablet, Take 1 tablet (50 mg total) by mouth daily., Disp: 90 tablet, Rfl: 0 .  albuterol (PROVENTIL HFA;VENTOLIN HFA) 108 (90 Base) MCG/ACT inhaler, Inhale 2 puffs every 6 (six) hours as needed into the lungs for wheezing or shortness of breath. (Patient not  taking: Reported on 12/26/2018), Disp: 1 Inhaler, Rfl: 2    Family History  Problem Relation Age of Onset  . Hypertension Maternal Aunt   . Diabetes Maternal Grandmother   . Hypertension Maternal Grandmother   . Depression Maternal Grandmother      Social History   Tobacco Use  . Smoking status: Current Every Day Smoker    Types: E-cigarettes  . Smokeless tobacco: Never Used  Substance Use Topics  . Alcohol use: Yes    Comment: occassionally  . Drug use: No    Allergies as of 12/26/2018  . (No Known Allergies)    Review of Systems:    All systems reviewed and negative except where noted in HPI.   Physical Exam:  BP 127/82 (BP Location: Left Arm, Patient Position: Sitting, Cuff Size: Normal)   Pulse 79   Resp 17   Wt 160 lb 9.6 oz (72.8 kg)   BMI 24.42 kg/m  No LMP recorded.  General:   Alert,  Well-developed, well-nourished, pleasant and cooperative in NAD Head:  Normocephalic and atraumatic. Eyes:  Sclera clear, no icterus.   Conjunctiva pink. Ears:  Normal auditory acuity. Nose:  No deformity, discharge, or lesions. Mouth:  No deformity or lesions,oropharynx pink & moist. Neck:  Supple; no masses or thyromegaly. Lungs:  Respirations even and unlabored.  Clear throughout to auscultation.   No wheezes, crackles, or rhonchi. No acute distress. Heart:  Regular rate and rhythm; no murmurs,  clicks, rubs, or gallops. Abdomen:  Normal bowel sounds. Soft, mild epigastric tenderness and non-distended without masses, hepatosplenomegaly or hernias noted.  No guarding or rebound tenderness.   Rectal: Not performed Msk:  Symmetrical without gross deformities. Good, equal movement & strength bilaterally. Pulses:  Normal pulses noted. Extremities:  No clubbing or edema.  No cyanosis. Neurologic:  Alert and oriented x3;  grossly normal neurologically. Skin:  Intact without significant lesions or rashes. No jaundice. Psych:  Alert and cooperative. Normal mood and affect.   Imaging Studies: Reviewed  Assessment and Plan:   Cheryl Morgan is a 23 y.o. present female with no significant past medical history presents with 1 month history of symptoms of dyspepsia, one episode of rectal bleeding  Dyspepsia with temporary weight loss Recommend EGD with biopsies to evaluate for H. pylori and peptic ulcer disease Continue Pepcid as needed for now  Rectal bleeding, painless, isolated episode Most likely hemorrhoidal, however recommend flexible sigmoidoscopy to evaluate for left colon and rectal lesions   Follow up in 2 months   Arlyss Repress, MD

## 2018-12-26 NOTE — Telephone Encounter (Signed)
Patient did not want to schedule a 4 wk f/u. She would call back later to schedule.

## 2018-12-27 ENCOUNTER — Other Ambulatory Visit: Payer: Self-pay

## 2018-12-27 ENCOUNTER — Ambulatory Visit: Payer: Self-pay | Admitting: Physician Assistant

## 2018-12-27 ENCOUNTER — Encounter: Payer: Self-pay | Admitting: *Deleted

## 2019-01-01 NOTE — Discharge Instructions (Signed)
General Anesthesia, Adult, Care After  This sheet gives you information about how to care for yourself after your procedure. Your health care provider may also give you more specific instructions. If you have problems or questions, contact your health care provider.  What can I expect after the procedure?  After the procedure, the following side effects are common:  Pain or discomfort at the IV site.  Nausea.  Vomiting.  Sore throat.  Trouble concentrating.  Feeling cold or chills.  Weak or tired.  Sleepiness and fatigue.  Soreness and body aches. These side effects can affect parts of the body that were not involved in surgery.  Follow these instructions at home:    For at least 24 hours after the procedure:  Have a responsible adult stay with you. It is important to have someone help care for you until you are awake and alert.  Rest as needed.  Do not:  Participate in activities in which you could fall or become injured.  Drive.  Use heavy machinery.  Drink alcohol.  Take sleeping pills or medicines that cause drowsiness.  Make important decisions or sign legal documents.  Take care of children on your own.  Eating and drinking  Follow any instructions from your health care provider about eating or drinking restrictions.  When you feel hungry, start by eating small amounts of foods that are soft and easy to digest (bland), such as toast. Gradually return to your regular diet.  Drink enough fluid to keep your urine pale yellow.  If you vomit, rehydrate by drinking water, juice, or clear broth.  General instructions  If you have sleep apnea, surgery and certain medicines can increase your risk for breathing problems. Follow instructions from your health care provider about wearing your sleep device:  Anytime you are sleeping, including during daytime naps.  While taking prescription pain medicines, sleeping medicines, or medicines that make you drowsy.  Return to your normal activities as told by your health care  provider. Ask your health care provider what activities are safe for you.  Take over-the-counter and prescription medicines only as told by your health care provider.  If you smoke, do not smoke without supervision.  Keep all follow-up visits as told by your health care provider. This is important.  Contact a health care provider if:  You have nausea or vomiting that does not get better with medicine.  You cannot eat or drink without vomiting.  You have pain that does not get better with medicine.  You are unable to pass urine.  You develop a skin rash.  You have a fever.  You have redness around your IV site that gets worse.  Get help right away if:  You have difficulty breathing.  You have chest pain.  You have blood in your urine or stool, or you vomit blood.  Summary  After the procedure, it is common to have a sore throat or nausea. It is also common to feel tired.  Have a responsible adult stay with you for the first 24 hours after general anesthesia. It is important to have someone help care for you until you are awake and alert.  When you feel hungry, start by eating small amounts of foods that are soft and easy to digest (bland), such as toast. Gradually return to your regular diet.  Drink enough fluid to keep your urine pale yellow.  Return to your normal activities as told by your health care provider. Ask your health care   provider what activities are safe for you.  This information is not intended to replace advice given to you by your health care provider. Make sure you discuss any questions you have with your health care provider.  Document Released: 01/09/2001 Document Revised: 05/19/2017 Document Reviewed: 05/19/2017  Elsevier Interactive Patient Education  2019 [employer]

## 2019-01-02 ENCOUNTER — Ambulatory Visit: Payer: BLUE CROSS/BLUE SHIELD | Admitting: Anesthesiology

## 2019-01-02 ENCOUNTER — Other Ambulatory Visit: Payer: Self-pay

## 2019-01-02 ENCOUNTER — Encounter
Admission: RE | Disposition: A | Discharge status: Home / Self Care | Payer: Self-pay | Source: Home / Self Care | Attending: Gastroenterology

## 2019-01-02 ENCOUNTER — Ambulatory Visit
Admission: RE | Admit: 2019-01-02 | Discharge: 2019-01-02 | Disposition: A | Discharge status: Home / Self Care | Payer: BLUE CROSS/BLUE SHIELD | Attending: Gastroenterology | Admitting: Gastroenterology

## 2019-01-02 DIAGNOSIS — K625 Hemorrhage of anus and rectum: Secondary | ICD-10-CM | POA: Diagnosis not present

## 2019-01-02 DIAGNOSIS — F17210 Nicotine dependence, cigarettes, uncomplicated: Secondary | ICD-10-CM | POA: Insufficient documentation

## 2019-01-02 DIAGNOSIS — K648 Other hemorrhoids: Secondary | ICD-10-CM | POA: Insufficient documentation

## 2019-01-02 DIAGNOSIS — R1013 Epigastric pain: Secondary | ICD-10-CM | POA: Diagnosis present

## 2019-01-02 DIAGNOSIS — K219 Gastro-esophageal reflux disease without esophagitis: Secondary | ICD-10-CM | POA: Insufficient documentation

## 2019-01-02 HISTORY — DX: Gastro-esophageal reflux disease without esophagitis: K21.9

## 2019-01-02 HISTORY — PX: ESOPHAGOGASTRODUODENOSCOPY (EGD) WITH PROPOFOL: SHX5813

## 2019-01-02 HISTORY — PX: FLEXIBLE SIGMOIDOSCOPY: SHX5431

## 2019-01-02 HISTORY — DX: Motion sickness, initial encounter: T75.3XXA

## 2019-01-02 LAB — POCT PREGNANCY, URINE: Preg Test, Ur: NEGATIVE

## 2019-01-02 SURGERY — ESOPHAGOGASTRODUODENOSCOPY (EGD) WITH PROPOFOL
Anesthesia: General | Site: Throat

## 2019-01-02 MED ORDER — ONDANSETRON HCL 4 MG/2ML IJ SOLN: 4.0000 mg | Freq: Once | INTRAMUSCULAR | Status: DC | PRN

## 2019-01-02 MED ORDER — PROPOFOL 10 MG/ML IV BOLUS
INTRAVENOUS | Status: DC | PRN
  Administered 2019-01-02 (×2): 50 mg via INTRAVENOUS
  Administered 2019-01-02: 20 mg via INTRAVENOUS
  Administered 2019-01-02: 100 mg via INTRAVENOUS
  Administered 2019-01-02 (×4): 50 mg via INTRAVENOUS
  Administered 2019-01-02: 30 mg via INTRAVENOUS

## 2019-01-02 MED ORDER — GLYCOPYRROLATE 0.2 MG/ML IJ SOLN
INTRAMUSCULAR | Status: DC | PRN
  Administered 2019-01-02: 0.2 mg via INTRAVENOUS

## 2019-01-02 MED ORDER — DEXMEDETOMIDINE HCL 200 MCG/2ML IV SOLN
INTRAVENOUS | Status: DC | PRN
  Administered 2019-01-02: 8 ug via INTRAVENOUS

## 2019-01-02 MED ORDER — LIDOCAINE HCL (CARDIAC) PF 100 MG/5ML IV SOSY
PREFILLED_SYRINGE | INTRAVENOUS | Status: DC | PRN
  Administered 2019-01-02: 40 mg via INTRAVENOUS

## 2019-01-02 MED ORDER — LACTATED RINGERS IV SOLN
10.0000 mL/h | INTRAVENOUS | Status: DC
  Administered 2019-01-02: 10 mL/h via INTRAVENOUS

## 2019-01-02 MED ORDER — STERILE WATER FOR IRRIGATION IR SOLN
Status: DC | PRN
  Administered 2019-01-02: 09:00:00

## 2019-01-02 MED ORDER — SODIUM CHLORIDE 0.9 % IV SOLN: INTRAVENOUS | Status: DC

## 2019-01-02 SURGICAL SUPPLY — 7 items
BLOCK BITE 60FR ADLT L/F GRN (MISCELLANEOUS) ×4 IMPLANT
CANISTER SUCT 1200ML W/VALVE (MISCELLANEOUS) ×4 IMPLANT
FORCEPS BIOP RAD 4 LRG CAP 4 (CUTTING FORCEPS) ×4 IMPLANT
GOWN CVR UNV OPN BCK APRN NK (MISCELLANEOUS) ×4 IMPLANT
GOWN ISOL THUMB LOOP REG UNIV (MISCELLANEOUS) ×4
KIT ENDO PROCEDURE OLY (KITS) ×4 IMPLANT
WATER STERILE IRR 250ML POUR (IV SOLUTION) ×4 IMPLANT

## 2019-01-02 NOTE — Op Note (Signed)
[facility] Gastroenter[facility] Patient Name: Cheryl Morgan Procedure Date: 01/02/2019 8:28 AM MRN: [id number] Account #: [id number] Date of Birth: [birth year] Admit Type: Outpatient Age: 23 Room: [facility] OR ROOM 01 Gender: Female Note Status: Finalized Procedure:            Upper GI endoscopy Indications:          Epigastric abdominal pain, Dyspepsia Providers:            Lin Landsman MD, MD Referring MD:         Mar Daring (Referring MD) Medicines:            Monitored Anesthesia Care Complications:        No immediate complications. Estimated blood loss: None. Procedure:            Pre-Anesthesia Assessment:                       - Prior to the procedure, a History and Physical was                        performed, and patient medications and allergies were                        reviewed. The patient is competent. The risks and                        benefits of the procedure and the sedation options and                        risks were discussed with the patient. All questions                        were answered and informed consent was obtained.                        Patient identification and proposed procedure were                        verified by the physician, the nurse, the                        anesthesiologist, the anesthetist and the technician in                        the pre-procedure area in the procedure room in the                        endoscopy suite. Mental Status Examination: alert and                        oriented. Airway Examination: normal oropharyngeal                        airway and neck mobility. Respiratory Examination:                        clear to auscultation. CV Examination: normal.                        Prophylactic Antibiotics: The patient does not require  prophylactic antibiotics. Prior Anticoagulants: The                        patient has taken no previous anticoagulant or                    antiplatelet agents. ASA Grade Assessment: II - A                        patient with mild systemic disease. After reviewing the                        risks and benefits, the patient was deemed in                        satisfactory condition to undergo the procedure. The                        anesthesia plan was to use monitored anesthesia care                        (MAC). Immediately prior to administration of                        medications, the patient was re-assessed for adequacy                        to receive sedatives. The heart rate, respiratory rate,                        oxygen saturations, blood pressure, adequacy of                        pulmonary ventilation, and response to care were                        monitored throughout the procedure. The physical status                        of the patient was re-assessed after the procedure.                       After obtaining informed consent, the endoscope was                        passed under direct vision. Throughout the procedure,                        the patient's blood pressure, pulse, and oxygen                        saturations were monitored continuously. The was                        introduced through the mouth, and advanced to the                        second part of duodenum. The upper GI endoscopy was                        accomplished  without difficulty. The patient tolerated                        the procedure well. Findings:      The duodenal bulb and second portion of the duodenum were normal.       Biopsies for histology were taken with a cold forceps for evaluation of       celiac disease.      Multiple dispersed, diminutive non-bleeding erosions were found at the       incisura and in the gastric antrum. There were no stigmata of recent       bleeding. Biopsies were taken with a cold forceps for Helicobacter       pylori testing.      The gastric fundus and gastric body  were normal. Biopsies were taken       with a cold forceps for Helicobacter pylori testing.      The gastroesophageal junction and examined esophagus were normal. Impression:           - No specimens collected. Recommendation:       - Await pathology results. Procedure Code(s):    --- Professional ---                       [phone], Esophagogastroduodenoscopy, flexible, transoral;                        with biopsy, single or multiple Diagnosis Code(s):    --- Professional ---                       R10.13, Epigastric pain CPT copyright 2018 American Medical Association. All rights reserved. The codes documented in this report are preliminary and upon coder review may  be revised to meet current compliance requirements. Dr. Ulyess Mort Lin Landsman MD, MD 01/02/2019 8:47:29 AM This report has been signed electronically. Number of Addenda: 0 Note Initiated On: 01/02/2019 8:28 AM Total Procedure Duration: 0 hours 5 minutes 37 seconds       [facility]

## 2019-01-02 NOTE — Anesthesia Preprocedure Evaluation (Signed)
Anesthesia Evaluation  Patient identified by MRN, date of birth, ID band Patient awake    Reviewed: Allergy & Precautions, H&P , NPO status , Patient's Chart, lab work & pertinent test results  Airway Mallampati: II  TM Distance: >3 FB Neck ROM: full    Dental no notable dental hx.    Pulmonary Current Smoker,    Pulmonary exam normal breath sounds clear to auscultation       Cardiovascular Normal cardiovascular exam Rhythm:regular Rate:Normal     Neuro/Psych    GI/Hepatic GERD  ,  Endo/Other    Renal/GU      Musculoskeletal   Abdominal   Peds  Hematology   Anesthesia Other Findings   Reproductive/Obstetrics                             Anesthesia Physical Anesthesia Plan  ASA: II  Anesthesia Plan: General   Post-op Pain Management:    Induction: Intravenous  PONV Risk Score and Plan: 3 and Propofol infusion and Treatment may vary due to age or medical condition  Airway Management Planned: Natural Airway  Additional Equipment:   Intra-op Plan:   Post-operative Plan:   Informed Consent: I have reviewed the patients History and Physical, chart, labs and discussed the procedure including the risks, benefits and alternatives for the proposed anesthesia with the patient or authorized representative who has indicated his/her understanding and acceptance.       Plan Discussed with: CRNA  Anesthesia Plan Comments:         Anesthesia Quick Evaluation

## 2019-01-02 NOTE — Transfer of Care (Signed)
Immediate Anesthesia Transfer of Care Note  Patient: Cheryl Morgan  Procedure(s) Performed: ESOPHAGOGASTRODUODENOSCOPY (EGD) WITH BIOPSIES (N/A Throat) FLEXIBLE SIGMOIDOSCOPY (N/A Rectum)  Patient Location: PACU  Anesthesia Type: General  Level of Consciousness: awake, alert  and patient cooperative  Airway and Oxygen Therapy: Patient Spontanous Breathing and Patient connected to supplemental oxygen  Post-op Assessment: Post-op Vital signs reviewed, Patient's Cardiovascular Status Stable, Respiratory Function Stable, Patent Airway and No signs of Nausea or vomiting  Post-op Vital Signs: Reviewed and stable  Complications: No apparent anesthesia complications

## 2019-01-02 NOTE — Anesthesia Postprocedure Evaluation (Signed)
Anesthesia Post Note  Patient: Cheryl Morgan  Procedure(s) Performed: ESOPHAGOGASTRODUODENOSCOPY (EGD) WITH BIOPSIES (N/A Throat) FLEXIBLE SIGMOIDOSCOPY (N/A Rectum)  Patient location during evaluation: PACU Anesthesia Type: General Level of consciousness: awake and alert and oriented Pain management: satisfactory to patient Vital Signs Assessment: post-procedure vital signs reviewed and stable Respiratory status: spontaneous breathing, nonlabored ventilation and respiratory function stable Cardiovascular status: blood pressure returned to baseline and stable Postop Assessment: Adequate PO intake and No signs of nausea or vomiting Anesthetic complications: no    Cherly Beach

## 2019-01-02 NOTE — H&P (Signed)
Arlyss Repress, MD [address]  [address]  [place], [place] [address]  Main: [phone]  Fax: [phone] Pager: [phone]  Primary Care Physician:  Margaretann Loveless, PA-C Primary Gastroenterologist:  Dr. Arlyss Repress  Pre-Procedure History & Physical: HPI:  Cheryl Morgan is a 23 y.o. female is here for an endoscopy and flexible sigmoidoscopy.   Past Medical History:  Diagnosis Date  . GERD (gastroesophageal reflux disease)   . Motion sickness    circular motion  . Seasonal allergies   . Weight gain 08/01/2012    Past Surgical History:  Procedure Laterality Date  . MOUTH SURGERY     tooth extraction  . 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 Authorizing Provider  famotidine (PEPCID) 10 MG tablet Take 10 mg by mouth at bedtime.   Yes [provider]  sertraline (ZOLOFT) 50 MG tablet Take 1 tablet (50 mg total) by mouth daily. 11/15/18 02/13/19 Yes Pollak, Lavella Hammock, PA-C  albuterol (PROVENTIL HFA;VENTOLIN HFA) 108 (90 Base) MCG/ACT inhaler Inhale 2 puffs every 6 (six) hours as needed into the lungs for wheezing or shortness of breath. Patient not taking: Reported on 12/26/2018 08/22/17   Trey Sailors, PA-C    Allergies as of 12/26/2018  . (No Known Allergies)    Family History  Problem Relation Age of Onset  . Hypertension Maternal Aunt   . Diabetes Maternal Grandmother   . Hypertension Maternal Grandmother   . Depression Maternal Grandmother     Social History   Socioeconomic History  . Marital status: Single    Spouse name: Not on file  . Number of children: Not on file  . Years of education: Not on file  . Highest education level: Not on file  Occupational History  . Not on file  Social Needs  . Financial resource strain: Not on file  . Food insecurity:    Worry: Not on file    Inability: Not on file  . Transportation needs:    Medical: Not on file   Non-medical: Not on file  Tobacco Use  . Smoking status: Current Every Day Smoker    Types: E-cigarettes  . Smokeless tobacco: Never Used  . Tobacco comment: Smoked "regular" cigarettes for approx 3 months in 2019  Substance and Sexual Activity  . Alcohol use: Yes    Comment: occassionally - 1x/mo  . Drug use: No  . Sexual activity: Yes    Birth control/protection: I.U.D.    Comment: Skyla inserted 08-17-12  Lifestyle  . Physical activity:    Days per week: Not on file    Minutes per session: Not on file  . Stress: Not on file  Relationships  . Social connections:    Talks on phone: Not on file    Gets together: Not on file    Attends religious service: Not on file    Active member of club or organization: Not on file    Attends meetings of clubs or organizations: Not on file    Relationship status: Not on file  . Intimate partner violence:    Fear of current or ex partner: Not on file    Emotionally abused: Not on file    Physically abused: Not on file    Forced sexual activity: Not on file  Other Topics Concern  . Not on file  Social History Narrative  . Not on  file    Review of Systems: See HPI, otherwise negative ROS  Physical Exam: BP 107/73   Pulse 60   Temp 98.1 F (36.7 C) (Temporal)   Resp 16   Ht 5\' 7"  (1.702 m)   Wt 66.7 kg   LMP 12/16/2018 (Exact Date) Comment: preg test neg  SpO2 98%   BMI 23.02 kg/m  General:   Alert,  pleasant and cooperative in NAD Head:  Normocephalic and atraumatic. Neck:  Supple; no masses or thyromegaly. Lungs:  Clear throughout to auscultation.    Heart:  Regular rate and rhythm. Abdomen:  Soft, nontender and nondistended. Normal bowel sounds, without guarding, and without rebound.   Neurologic:  Alert and  oriented x4;  grossly normal neurologically.  Impression/Plan: Cheryl Morgan is here for an endoscopy and flexible sigmoidoscopy to be performed for dyspepsia and rectal bleeding  Risks, benefits, limitations,  and alternatives regarding  endoscopy and flexible sigmoidoscopy have been reviewed with the patient.  Questions have been answered.  All parties agreeable.   Lannette Donath, MD  01/02/2019, 8:19 AM

## 2019-01-02 NOTE — Op Note (Signed)
[facility] Patient Name: Cheryl Morgan Procedure Date: 01/02/2019 8:27 AM MRN: [id number] Account #: [id number] Date of Birth: 11/03/1995 Admit Type: Outpatient Age: 23 Room: [facility] OR ROOM 01 Gender: Female Note Status: Finalized Procedure:            Flexible Sigmoidoscopy Indications:          Rectal bleeding Providers:            Lin Landsman MD, MD Medicines:            Monitored Anesthesia Care Complications:        No immediate complications. Estimated blood loss: None. Procedure:            Pre-Anesthesia Assessment:                       - Prior to the procedure, a History and Physical was                        performed, and patient medications and allergies were                        reviewed. The patient is competent. The risks and                        benefits of the procedure and the sedation options and                        risks were discussed with the patient. All questions                        were answered and informed consent was obtained.                        Patient identification and proposed procedure were                        verified by the physician, the nurse, the                        anesthesiologist, the anesthetist and the technician in                        the pre-procedure area in the procedure room in the                        endoscopy suite. Mental Status Examination: alert and                        oriented. Airway Examination: normal oropharyngeal                        airway and neck mobility. Respiratory Examination:                        clear to auscultation. CV Examination: normal.                        Prophylactic Antibiotics: The patient does not require  prophylactic antibiotics. Prior Anticoagulants: The                        patient has taken no previous anticoagulant or                        antiplatelet agents. ASA Grade Assessment: II - A                  patient with mild systemic disease. After reviewing the                        risks and benefits, the patient was deemed in                        satisfactory condition to undergo the procedure. The                        anesthesia plan was to use monitored anesthesia care                        (MAC). Immediately prior to administration of                        medications, the patient was re-assessed for adequacy                        to receive sedatives. The heart rate, respiratory rate,                        oxygen saturations, blood pressure, adequacy of                        pulmonary ventilation, and response to care were                        monitored throughout the procedure. The physical status                        of the patient was re-assessed after the procedure.                       After obtaining informed consent, the scope was passed                        under direct vision. The was introduced through the                        anus and advanced to the the descending colon. The                        flexible sigmoidoscopy was accomplished without                        difficulty. The patient tolerated the procedure well.                        The quality of the bowel preparation was poor. Findings:      The entire examined colon appeared normal.      Non-bleeding internal hemorrhoids were found during retroflexion. The  hemorrhoids were large.      The perianal and digital rectal examinations were normal. Impression:           - Preparation of the colon was poor.                       - The entire examined colon is normal.                       - No specimens collected. Recommendation:       - Discharge patient to home (with parent).                       - Resume regular diet today.                       - Continue present medications.                       - Return to my office in 28month for hemorrhoid ligation Procedure Code(s):     --- Professional ---                       [id number] Sigmoidoscopy, flexible; diagnostic, including                        collection of specimen(s) by brushing or washing, when                        performed (separate procedure) Diagnosis Code(s):    --- Professional ---                       K62.5, Hemorrhage of anus and rectum CPT copyright 2018 American Medical Association. All rights reserved. The codes documented in this report are preliminary and upon coder review may  be revised to meet current compliance requirements. Dr. RUlyess MortRLin LandsmanMD, MD 01/02/2019 8:57:02 AM This report has been signed electronically. Number of Addenda: 0 Note Initiated On: 01/02/2019 8:27 AM Total Procedure Duration: 0 hours 5 minutes 26 seconds       [facility]

## 2019-01-02 NOTE — Anesthesia Procedure Notes (Signed)
Procedure Name: MAC Date/Time: 01/02/2019 8:32 AM Performed by: Janna Arch, CRNA Pre-anesthesia Checklist: Patient identified, Emergency Drugs available, Suction available and Patient being monitored Patient Re-evaluated:Patient Re-evaluated prior to induction Oxygen Delivery Method: Simple face mask

## 2019-01-03 ENCOUNTER — Other Ambulatory Visit: Payer: Self-pay

## 2019-01-04 LAB — SURGICAL PATHOLOGY

## 2019-01-07 ENCOUNTER — Telehealth: Payer: Self-pay | Admitting: Gastroenterology

## 2019-01-07 NOTE — Telephone Encounter (Signed)
Patient called stating she received her results to her procedure on 01-02-19 but would like to talk with a nurse to understand them.

## 2019-01-08 NOTE — Telephone Encounter (Signed)
Spoke with pt and explained results from procedures, pt verbalized understanding

## 2019-01-08 NOTE — Telephone Encounter (Signed)
Pt is calling regarding her results  She does not understand them and needs a call from Nurse

## 2019-01-15 ENCOUNTER — Other Ambulatory Visit: Payer: Self-pay | Admitting: Physician Assistant

## 2019-01-15 DIAGNOSIS — R1013 Epigastric pain: Secondary | ICD-10-CM

## 2019-01-15 MED ORDER — SUCRALFATE 1 G PO TABS: 1.0000 g | ORAL_TABLET | Freq: Three times a day (TID) | ORAL | 1 refills | Status: DC

## 2019-01-15 NOTE — Telephone Encounter (Signed)
Sent in carafate. She will need e-visit regarding changing her OCP.

## 2019-01-15 NOTE — Telephone Encounter (Signed)
[facility] faxed refill request for the following medications:  sucralfate (CARAFATE) 1 g tablet   Please advise.

## 2019-01-15 NOTE — Telephone Encounter (Signed)
Don't believe patient is taking this. Please call and check, if not can remove from med list.

## 2019-01-15 NOTE — Progress Notes (Signed)
       Patient: Cheryl Morgan Female    DOB: [birth year]   23 y.o.   MRN: [id number] Visit Date: 01/16/2019  Today's Provider: Trey Sailors, PA-C   Chief Complaint  Patient presents with  . Contraception   Subjective:     HPI   Patient doing a virtual visit with provider. Patient would like her Sronyx change to something else. Reports that her acne is worsening. Has been on this medication for 2 months. Does smoke e-cigarettes daily. Denies history of migraines, heart attack, blood clot and stroke. Denies history of HTN.   BP Readings from Last 3 Encounters:  01/02/19 (!) 88/63  12/26/18 127/82  11/28/18 129/88    No Known Allergies   Current Outpatient Medications:  .  ethynodiol-ethinyl estradiol (KELNOR 1/35) 1-35 MG-MCG tablet, Take 1 tablet by mouth daily., Disp: 3 Package, Rfl: 3 .  famotidine (PEPCID) 10 MG tablet, Take 10 mg by mouth at bedtime., Disp: , Rfl:  .  sertraline (ZOLOFT) 50 MG tablet, Take 1 tablet (50 mg total) by mouth daily., Disp: 90 tablet, Rfl: 0 .  sucralfate (CARAFATE) 1 g tablet, Take 1 tablet (1 g total) by mouth 4 (four) times daily -  with meals and at bedtime for 30 days., Disp: 120 tablet, Rfl: 1  Review of Systems  Social History   Tobacco Use  . Smoking status: Current Every Day Smoker    Types: E-cigarettes  . Smokeless tobacco: Never Used  . Tobacco comment: Smoked "regular" cigarettes for approx 3 months in 2019  Substance Use Topics  . Alcohol use: Yes    Comment: occassionally - 1x/mo      Objective:   There were no vitals taken for this visit. There were no vitals filed for this visit.   Physical Exam Constitutional:      Appearance: Normal appearance.  Pulmonary:     Effort: Pulmonary effort is normal. No respiratory distress.  Neurological:     Mental Status: She is alert. Mental status is at baseline.  Psychiatric:        Mood and Affect: Mood normal.        Behavior: Behavior normal.           Assessment & Plan    1. Encounter for surveillance of contraceptive pills  Will change medication as below to help with acne. Counseled on blood clot risk related to OCP and how this is increased with smoking. Advised to quit smoking. Counseled this will not protect against STI and she should still use condoms. Counseled that she can finish current pill pack and start new medication on next pill pack.   - ethynodiol-ethinyl estradiol [place] 1/35) 1-35 MG-MCG tablet; Take 1 tablet by mouth daily.  Dispense: 3 Package; Refill: 3  The entirety of the information documented in the History of Present Illness, Review of Systems and Physical Exam were personally obtained by me. Portions of this information were initially documented by Rondel Baton, CMA and reviewed by me for thoroughness and accuracy.   Return if symptoms worsen or fail to improve.         Trey Sailors, PA-C  [facility]

## 2019-01-15 NOTE — Telephone Encounter (Signed)
Patient reports that she is taking the Sulcralfate and she also would like another birth control medicine. Reports that she is currently on Sronyx and reports that her acne is worsening.

## 2019-01-15 NOTE — Telephone Encounter (Signed)
Patient advised as directed below. Patient scheduled for tomorrow.

## 2019-01-16 ENCOUNTER — Ambulatory Visit (INDEPENDENT_AMBULATORY_CARE_PROVIDER_SITE_OTHER): Payer: BLUE CROSS/BLUE SHIELD | Admitting: Physician Assistant

## 2019-01-16 DIAGNOSIS — Z3041 Encounter for surveillance of contraceptive pills: Secondary | ICD-10-CM

## 2019-01-16 MED ORDER — ETHYNODIOL DIAC-ETH ESTRADIOL 1-35 MG-MCG PO TABS: 1.0000 | ORAL_TABLET | Freq: Every day | ORAL | 3 refills | Status: DC

## 2019-01-16 NOTE — Patient Instructions (Signed)

## 2019-02-05 ENCOUNTER — Other Ambulatory Visit: Payer: Self-pay | Admitting: Physician Assistant

## 2019-02-05 DIAGNOSIS — F419 Anxiety disorder, unspecified: Secondary | ICD-10-CM

## 2019-02-05 NOTE — Telephone Encounter (Signed)
L.O.V. 01/16/2019, please advise.

## 2019-03-26 ENCOUNTER — Encounter: Payer: Self-pay | Admitting: Physician Assistant

## 2019-03-26 ENCOUNTER — Ambulatory Visit (INDEPENDENT_AMBULATORY_CARE_PROVIDER_SITE_OTHER): Payer: BC Managed Care – PPO | Admitting: Physician Assistant

## 2019-03-26 ENCOUNTER — Other Ambulatory Visit (HOSPITAL_COMMUNITY)
Admission: RE | Admit: 2019-03-26 | Discharge: 2019-03-26 | Disposition: A | Discharge status: Home / Self Care | Payer: BC Managed Care – PPO | Source: Ambulatory Visit | Attending: Physician Assistant | Admitting: Physician Assistant

## 2019-03-26 ENCOUNTER — Other Ambulatory Visit: Payer: Self-pay

## 2019-03-26 VITALS — BP 96/59 | HR 83 | Temp 98.1°F | Ht 67.0 in | Wt 152.8 lb

## 2019-03-26 DIAGNOSIS — R3915 Urgency of urination: Secondary | ICD-10-CM | POA: Diagnosis not present

## 2019-03-26 DIAGNOSIS — N3090 Cystitis, unspecified without hematuria: Secondary | ICD-10-CM

## 2019-03-26 LAB — POCT URINALYSIS DIPSTICK
Bilirubin, UA: NEGATIVE
Glucose, UA: NEGATIVE
Ketones, UA: NEGATIVE
Nitrite, UA: NEGATIVE
Protein, UA: NEGATIVE
Spec Grav, UA: 1.015 (ref 1.010–1.025)
Urobilinogen, UA: 0.2 E.U./dL
pH, UA: 7.5 (ref 5.0–8.0)

## 2019-03-26 MED ORDER — NITROFURANTOIN MONOHYD MACRO 100 MG PO CAPS: 100.0000 mg | ORAL_CAPSULE | Freq: Two times a day (BID) | ORAL | 0 refills | Status: AC

## 2019-03-26 NOTE — Patient Instructions (Signed)

## 2019-03-26 NOTE — Progress Notes (Signed)
Patient: Cheryl Morgan Female    DOB: [birth year]   23 y.o.   MRN: [id number] Visit Date: 03/26/2019  Today's Provider: Trinna Post, PA-C   Chief Complaint  Patient presents with  . Urinary Tract Infection    urgency, odor.  started 3 weeks ago  03/12/19   Subjective:     Urinary Tract Infection   This is a recurrent problem. The current episode started 1 to 4 weeks ago. Associated symptoms include frequency and urgency.    No Known Allergies   Current Outpatient Medications:  .  ethynodiol-ethinyl estradiol (KELNOR 1/35) 1-35 MG-MCG tablet, Take 1 tablet by mouth daily., Disp: 3 Package, Rfl: 3 .  sertraline (ZOLOFT) 50 MG tablet, TAKE 1 TABLET BY MOUTH EVERY DAY, Disp: 90 tablet, Rfl: 0 .  famotidine (PEPCID) 10 MG tablet, Take 10 mg by mouth at bedtime., Disp: , Rfl:  .  sucralfate (CARAFATE) 1 g tablet, Take 1 tablet (1 g total) by mouth 4 (four) times daily -  with meals and at bedtime for 30 days., Disp: 120 tablet, Rfl: 1  Review of Systems  Constitutional: Negative.   HENT: Negative.   Eyes: Negative.   Respiratory: Negative.   Cardiovascular: Negative.   Gastrointestinal: Negative.   Endocrine: Negative.   Genitourinary: Positive for frequency and urgency.  Musculoskeletal: Negative.   Skin: Negative.   Allergic/Immunologic: Negative.   Neurological: Negative.   Hematological: Negative.   Psychiatric/Behavioral: Negative.     Social History   Tobacco Use  . Smoking status: Current Every Day Smoker    Types: E-cigarettes  . Smokeless tobacco: Never Used  . Tobacco comment: Smoked "regular" cigarettes for approx 3 months in 2019  Substance Use Topics  . Alcohol use: Yes    Comment: occassionally - 1x/mo      Objective:   There were no vitals taken for this visit. There were no vitals filed for this visit.   Physical Exam Constitutional:      General: She is not in acute distress.    Appearance: She is well-developed. She is not  diaphoretic.  Cardiovascular:     Rate and Rhythm: Normal rate and regular rhythm.  Pulmonary:     Effort: Pulmonary effort is normal.     Breath sounds: Normal breath sounds.  Abdominal:     General: Bowel sounds are normal. There is no distension.     Palpations: Abdomen is soft.     Tenderness: There is abdominal tenderness in the suprapubic area. There is no guarding or rebound.  Skin:    General: Skin is warm and dry.  Neurological:     Mental Status: She is alert and oriented to person, place, and time.  Psychiatric:        Behavior: Behavior normal.         Assessment & Plan    1. Urgency of urination  - POCT Urinalysis Dipstick - CULTURE, URINE COMPREHENSIVE - Urine cytology ancillary only  2. Cystitis  - nitrofurantoin, macrocrystal-monohydrate, (MACROBID) 100 MG capsule; Take 1 capsule (100 mg total) by mouth 2 (two) times daily for 5 days.  Dispense: 10 capsule; Refill: 0  The entirety of the information documented in the History of Present Illness, Review of Systems and Physical Exam were personally obtained by me. Portions of this information were initially documented by Edd Arbour, CMA and reviewed by me for thoroughness and accuracy.   F/u PRN  Trinna Post, PA-C  [facility]

## 2019-03-28 LAB — URINE CYTOLOGY ANCILLARY ONLY
Chlamydia: NEGATIVE
Neisseria Gonorrhea: NEGATIVE
Trichomonas: NEGATIVE

## 2019-03-30 LAB — CULTURE, URINE COMPREHENSIVE

## 2019-04-02 ENCOUNTER — Telehealth: Payer: Self-pay

## 2019-04-02 DIAGNOSIS — N3000 Acute cystitis without hematuria: Secondary | ICD-10-CM

## 2019-04-02 MED ORDER — SULFAMETHOXAZOLE-TRIMETHOPRIM 800-160 MG PO TABS: 1.0000 | ORAL_TABLET | Freq: Two times a day (BID) | ORAL | 0 refills | Status: AC

## 2019-04-02 NOTE — Telephone Encounter (Signed)
Sent in Bactrim DS BID x 3 days for cystitis.

## 2019-04-02 NOTE — Telephone Encounter (Signed)
lmtcb

## 2019-04-02 NOTE — Telephone Encounter (Signed)
-----   Message from Trinna Post, [place] sent at 04/01/2019  1:18 PM EDT ----- Urine culture came back with bacteria of intermediate sensitivity to antibiotic given meaning it may or may not work. How is she feeling?

## 2019-04-02 NOTE — Telephone Encounter (Signed)
Patient advised as below. Patient is still having symptoms. And would like a new abx called into [facility] [address].

## 2019-04-02 NOTE — Telephone Encounter (Signed)
Pr returned call  teri

## 2019-04-09 ENCOUNTER — Telehealth: Payer: Self-pay | Admitting: Physician Assistant

## 2019-04-09 NOTE — Telephone Encounter (Signed)
Patient most likely needs evaluation in office.  Can try pepto bismol over night

## 2019-04-09 NOTE — Telephone Encounter (Signed)
Patient advised, instructed patient to take pepto/imodium PRN for G.I relief. Patient was advised that there are no openings in office tomorrow and if symptoms persist to go to the nearest urgent care facility. Patient voiced understanding. KW

## 2019-04-09 NOTE — Telephone Encounter (Signed)
Please advise 

## 2019-04-09 NOTE — Telephone Encounter (Signed)
Pt calling about her symptoms:   Cheryl Morgan - loose stools  Very nauseated Severe heart burn May have a temp - doesn't have a thermometer Fatigued  Needing to discuss what she needs to do.  Please call pt back at [phone].  Thanks, [employer]

## 2019-05-15 ENCOUNTER — Telehealth: Payer: Self-pay

## 2019-05-15 NOTE — Telephone Encounter (Signed)
Patient stateds that she went to [place] last Thursday 05/09/2019 until this Tuesday  05/14/2019 and woke up this morning weak, headaches, SOB (vapes), chills no fever or other symptoms at the moment. Patient was advised if symptoms worsen to go to the ER. Please advise.

## 2019-05-15 NOTE — Telephone Encounter (Signed)
Will need evisit (scheduled tomorrow already) and covid testing. Needs to isolate and quarantine until test results are received.

## 2019-05-16 ENCOUNTER — Other Ambulatory Visit: Payer: Self-pay

## 2019-05-16 ENCOUNTER — Encounter: Payer: Self-pay | Admitting: Physician Assistant

## 2019-05-16 ENCOUNTER — Telehealth (INDEPENDENT_AMBULATORY_CARE_PROVIDER_SITE_OTHER): Payer: BC Managed Care – PPO | Admitting: Physician Assistant

## 2019-05-16 VITALS — Temp 98.6°F | Wt 152.3 lb

## 2019-05-16 DIAGNOSIS — Z20828 Contact with and (suspected) exposure to other viral communicable diseases: Secondary | ICD-10-CM | POA: Diagnosis not present

## 2019-05-16 DIAGNOSIS — Z789 Other specified health status: Secondary | ICD-10-CM | POA: Diagnosis not present

## 2019-05-16 DIAGNOSIS — Z20822 Contact with and (suspected) exposure to covid-19: Secondary | ICD-10-CM

## 2019-05-16 DIAGNOSIS — G933 Postviral fatigue syndrome: Secondary | ICD-10-CM

## 2019-05-16 DIAGNOSIS — R509 Fever, unspecified: Secondary | ICD-10-CM | POA: Diagnosis not present

## 2019-05-16 DIAGNOSIS — G9331 Postviral fatigue syndrome: Secondary | ICD-10-CM

## 2019-05-16 NOTE — Patient Instructions (Signed)
Can take to lessen severity: Vit C 500mg  twice daily Quercertin 250-500mg  twice daily Zinc 75-100mg  daily Melatonin 3-6 mg at bedtime Vit D3 1000-2000 IU daily Aspirin 81 mg daily with food Optional: Famotidine 20mg  daily Also can add tylenol/ibuprofen as needed for fevers and body aches May add Mucinex or Mucinex DM as needed for cough/congestion  COVID-19 COVID-19 is a respiratory infection that is caused by a virus called severe acute respiratory syndrome coronavirus 2 (SARS-CoV-2). The disease is also known as coronavirus disease or novel coronavirus. In some people, the virus may not cause any symptoms. In others, it may cause a serious infection. The infection can get worse quickly and can lead to complications, such as:  Pneumonia, or infection of the lungs.  Acute respiratory distress syndrome or ARDS. This is fluid build-up in the lungs.  Acute respiratory failure. This is a condition in which there is not enough oxygen passing from the lungs to the body.  Sepsis or septic shock. This is a serious bodily reaction to an infection.  Blood clotting problems.  Secondary infections due to bacteria or fungus. The virus that causes COVID-19 is contagious. This means that it can spread from person to person through droplets from coughs and sneezes (respiratory secretions). What are the causes? This illness is caused by a virus. You may catch the virus by:  Breathing in droplets from an infected person's cough or sneeze.  Touching something, like a table or a doorknob, that was exposed to the virus (contaminated) and then touching your mouth, nose, or eyes. What increases the risk? Risk for infection You are more likely to be infected with this virus if you:  Live in or travel to an area with a COVID-19 outbreak.  Come in contact with a sick person who recently traveled to an area with a COVID-19 outbreak.  Provide care for or live with a person who is infected with COVID-19.  Risk for serious illness You are more likely to become seriously ill from the virus if you:  Are 26 years of age or older.  Have a long-term disease that lowers your body's ability to fight infection (immunocompromised).  Live in a nursing home or long-term care facility.  Have a long-term (chronic) disease such as: ? Chronic lung disease, including chronic obstructive pulmonary disease or asthma ? Heart disease. ? Diabetes. ? Chronic kidney disease. ? Liver disease.  Are obese. What are the signs or symptoms? Symptoms of this condition can range from mild to severe. Symptoms may appear any time from 2 to 14 days after being exposed to the virus. They include:  A fever.  A cough.  Difficulty breathing.  Chills.  Muscle pains.  A sore throat.  Loss of taste or smell. Some people may also have stomach problems, such as nausea, vomiting, or diarrhea. Other people may not have any symptoms of COVID-19. How is this diagnosed? This condition may be diagnosed based on:  Your signs and symptoms, especially if: ? You live in an area with a COVID-19 outbreak. ? You recently traveled to or from an area where the virus is common. ? You provide care for or live with a person who was diagnosed with COVID-19.  A physical exam.  Lab tests, which may include: ? A nasal swab to take a sample of fluid from your nose. ? A throat swab to take a sample of fluid from your throat. ? A sample of mucus from your lungs (sputum). ? Blood tests.  Imaging tests, which may include, X-rays, CT scan, or ultrasound. How is this treated? At present, there is no medicine to treat COVID-19. Medicines that treat other diseases are being used on a trial basis to see if they are effective against COVID-19. Your health care provider will talk with you about ways to treat your symptoms. For most people, the infection is mild and can be managed at home with rest, fluids, and over-the-counter medicines.  Treatment for a serious infection usually takes places in a hospital intensive care unit (ICU). It may include one or more of the following treatments. These treatments are given until your symptoms improve.  Receiving fluids and medicines through an IV.  Supplemental oxygen. Extra oxygen is given through a tube in the nose, a face mask, or a hood.  Positioning you to lie on your stomach (prone position). This makes it easier for oxygen to get into the lungs.  Continuous positive airway pressure (CPAP) or bi-level positive airway pressure (BPAP) machine. This treatment uses mild air pressure to keep the airways open. A tube that is connected to a motor delivers oxygen to the body.  Ventilator. This treatment moves air into and out of the lungs by using a tube that is placed in your windpipe.  Tracheostomy. This is a procedure to create a hole in the neck so that a breathing tube can be inserted.  Extracorporeal membrane oxygenation (ECMO). This procedure gives the lungs a chance to recover by taking over the functions of the heart and lungs. It supplies oxygen to the body and removes carbon dioxide. Follow these instructions at home: Lifestyle  If you are sick, stay home except to get medical care. Your health care provider will tell you how long to stay home. Call your health care provider before you go for medical care.  Rest at home as told by your health care provider.  Do not use any products that contain nicotine or tobacco, such as cigarettes, e-cigarettes, and chewing tobacco. If you need help quitting, ask your health care provider.  Return to your normal activities as told by your health care provider. Ask your health care provider what activities are safe for you. General instructions  Take over-the-counter and prescription medicines only as told by your health care provider.  Drink enough fluid to keep your urine pale yellow.  Keep all follow-up visits as told by your  health care provider. This is important. How is this prevented?  There is no vaccine to help prevent COVID-19 infection. However, there are steps you can take to protect yourself and others from this virus. To protect yourself:   Do not travel to areas where COVID-19 is a risk. The areas where COVID-19 is reported change often. To identify high-risk areas and travel restrictions, check the CDC travel website: FatFares.com.br  If you live in, or must travel to, an area where COVID-19 is a risk, take precautions to avoid infection. ? Stay away from people who are sick. ? Wash your hands often with soap and water for 20 seconds. If soap and water are not available, use an alcohol-based hand sanitizer. ? Avoid touching your mouth, face, eyes, or nose. ? Avoid going out in public, follow guidance from your state and local health authorities. ? If you must go out in public, wear a cloth face covering or face mask. ? Disinfect objects and surfaces that are frequently touched every day. This may include:  Counters and tables.  Doorknobs and light switches.  Sinks and faucets.  Electronics, such as phones, remote controls, keyboards, computers, and tablets. To protect others: If you have symptoms of COVID-19, take steps to prevent the virus from spreading to others.  If you think you have a COVID-19 infection, contact your health care provider right away. Tell your health care team that you think you may have a COVID-19 infection.  Stay home. Leave your house only to seek medical care. Do not use public transport.  Do not travel while you are sick.  Wash your hands often with soap and water for 20 seconds. If soap and water are not available, use alcohol-based hand sanitizer.  Stay away from other members of your household. Let healthy household members care for children and pets, if possible. If you have to care for children or pets, wash your hands often and wear a mask. If  possible, stay in your own room, separate from others. Use a different bathroom.  Make sure that all people in your household wash their hands well and often.  Cough or sneeze into a tissue or your sleeve or elbow. Do not cough or sneeze into your hand or into the air.  Wear a cloth face covering or face mask. Where to find more information  Centers for Disease Control and Prevention: StickerEmporium.tnwww.cdc.gov/coronavirus/2019-ncov/index.html  World Health Organization: https://thompson-craig.com/www.who.int/health-topics/coronavirus Contact a health care provider if:  You live in or have traveled to an area where COVID-19 is a risk and you have symptoms of the infection.  You have had contact with someone who has COVID-19 and you have symptoms of the infection. Get help right away if:  You have trouble breathing.  You have pain or pressure in your chest.  You have confusion.  You have bluish lips and fingernails.  You have difficulty waking from sleep.  You have symptoms that get worse. These symptoms may represent a serious problem that is an emergency. Do not wait to see if the symptoms will go away. Get medical help right away. Call your local emergency services (911 in the [place] Do not drive yourself to the hospital. Let the emergency medical personnel know if you think you have COVID-19. Summary  COVID-19 is a respiratory infection that is caused by a virus. It is also known as coronavirus disease or novel coronavirus. It can cause serious infections, such as pneumonia, acute respiratory distress syndrome, acute respiratory failure, or sepsis.  The virus that causes COVID-19 is contagious. This means that it can spread from person to person through droplets from coughs and sneezes.  You are more likely to develop a serious illness if you are 465 years of age or older, have a weak immunity, live in a nursing home, or have chronic disease.  There is no medicine to treat COVID-19. Your health care provider will  talk with you about ways to treat your symptoms.  Take steps to protect yourself and others from infection. Wash your hands often and disinfect objects and surfaces that are frequently touched every day. Stay away from people who are sick and wear a mask if you are sick. This information is not intended to replace advice given to you by your health care provider. Make sure you discuss any questions you have with your health care provider. Document Released: 11/08/2018 Document Revised: 02/28/2019 Document Reviewed: 11/08/2018 Elsevier Patient Education  2020 Elsevier Inc.     Person Under Monitoring Name: Cheryl Morgan  Location: [address] [place] [id number]   Infection  Prevention Recommendations for Individuals Confirmed to have, or Being Evaluated for, 2019 Novel Coronavirus (COVID-19) Infection Who Receive Care at Home  Individuals who are confirmed to have, or are being evaluated for, COVID-19 should follow the prevention steps below until a healthcare provider or local or state health department says they can return to normal activities.  Stay home except to get medical care You should restrict activities outside your home, except for getting medical care. Do not go to work, school, or public areas, and do not use public transportation or taxis.  Call ahead before visiting your doctor Before your medical appointment, call the healthcare provider and tell them that you have, or are being evaluated for, COVID-19 infection. This will help the healthcare provider's office take steps to keep other people from getting infected. Ask your healthcare provider to call the local or state health department.  Monitor your symptoms Seek prompt medical attention if your illness is worsening (e.g., difficulty breathing). Before going to your medical appointment, call the healthcare provider and tell them that you have, or are being evaluated for, COVID-19 infection. Ask your  healthcare provider to call the local or state health department.  Wear a facemask You should wear a facemask that covers your nose and mouth when you are in the same room with other people and when you visit a healthcare provider. People who live with or visit you should also wear a facemask while they are in the same room with you.  Separate yourself from other people in your home As much as possible, you should stay in a different room from other people in your home. Also, you should use a separate bathroom, if available.  Avoid sharing household items You should not share dishes, drinking glasses, cups, eating utensils, towels, bedding, or other items with other people in your home. After using these items, you should wash them thoroughly with soap and water.  Cover your coughs and sneezes Cover your mouth and nose with a tissue when you cough or sneeze, or you can cough or sneeze into your sleeve. Throw used tissues in a lined trash can, and immediately wash your hands with soap and water for at least 20 seconds or use an alcohol-based hand rub.  Wash your Union Pacific Corporationhands Wash your hands often and thoroughly with soap and water for at least 20 seconds. You can use an alcohol-based hand sanitizer if soap and water are not available and if your hands are not visibly dirty. Avoid touching your eyes, nose, and mouth with unwashed hands.   Prevention Steps for Caregivers and Household Members of Individuals Confirmed to have, or Being Evaluated for, COVID-19 Infection Being Cared for in the Home  If you live with, or provide care at home for, a person confirmed to have, or being evaluated for, COVID-19 infection please follow these guidelines to prevent infection:  Follow healthcare provider's instructions Make sure that you understand and can help the patient follow any healthcare provider instructions for all care.  Provide for the patient's basic needs You should help the patient with  basic needs in the home and provide support for getting groceries, prescriptions, and other personal needs.  Monitor the patient's symptoms If they are getting sicker, call his or her medical provider and tell them that the patient has, or is being evaluated for, COVID-19 infection. This will help the healthcare provider's office take steps to keep other people from getting infected. Ask the healthcare provider to call the local or state  health department.  Limit the number of people who have contact with the patient  If possible, have only one caregiver for the patient.  Other household members should stay in another home or place of residence. If this is not possible, they should stay  in another room, or be separated from the patient as much as possible. Use a separate bathroom, if available.  Restrict visitors who do not have an essential need to be in the home.  Keep older adults, very young children, and other sick people away from the patient Keep older adults, very young children, and those who have compromised immune systems or chronic health conditions away from the patient. This includes people with chronic heart, lung, or kidney conditions, diabetes, and cancer.  Ensure good ventilation Make sure that shared spaces in the home have good air flow, such as from an air conditioner or an opened window, weather permitting.  Wash your hands often  Wash your hands often and thoroughly with soap and water for at least 20 seconds. You can use an alcohol based hand sanitizer if soap and water are not available and if your hands are not visibly dirty.  Avoid touching your eyes, nose, and mouth with unwashed hands.  Use disposable paper towels to dry your hands. If not available, use dedicated cloth towels and replace them when they become wet.  Wear a facemask and gloves  Wear a disposable facemask at all times in the room and gloves when you touch or have contact with the  patient's blood, body fluids, and/or secretions or excretions, such as sweat, saliva, sputum, nasal mucus, vomit, urine, or feces.  Ensure the mask fits over your nose and mouth tightly, and do not touch it during use.  Throw out disposable facemasks and gloves after using them. Do not reuse.  Wash your hands immediately after removing your facemask and gloves.  If your personal clothing becomes contaminated, carefully remove clothing and launder. Wash your hands after handling contaminated clothing.  Place all used disposable facemasks, gloves, and other waste in a lined container before disposing them with other household waste.  Remove gloves and wash your hands immediately after handling these items.  Do not share dishes, glasses, or other household items with the patient  Avoid sharing household items. You should not share dishes, drinking glasses, cups, eating utensils, towels, bedding, or other items with a patient who is confirmed to have, or being evaluated for, COVID-19 infection.  After the person uses these items, you should wash them thoroughly with soap and water.  Wash laundry thoroughly  Immediately remove and wash clothes or bedding that have blood, body fluids, and/or secretions or excretions, such as sweat, saliva, sputum, nasal mucus, vomit, urine, or feces, on them.  Wear gloves when handling laundry from the patient.  Read and follow directions on labels of laundry or clothing items and detergent. In general, wash and dry with the warmest temperatures recommended on the label.  Clean all areas the individual has used often  Clean all touchable surfaces, such as counters, tabletops, doorknobs, bathroom fixtures, toilets, phones, keyboards, tablets, and bedside tables, every day. Also, clean any surfaces that may have blood, body fluids, and/or secretions or excretions on them.  Wear gloves when cleaning surfaces the patient has come in contact with.  Use a diluted  bleach solution (e.g., dilute bleach with 1 part bleach and 10 parts water) or a household disinfectant with a label that says EPA-registered for coronaviruses. To make  a bleach solution at home, add 1 tablespoon of bleach to 1 quart (4 cups) of water. For a larger supply, add  cup of bleach to 1 gallon (16 cups) of water.  Read labels of cleaning products and follow recommendations provided on product labels. Labels contain instructions for safe and effective use of the cleaning product including precautions you should take when applying the product, such as wearing gloves or eye protection and making sure you have good ventilation during use of the product.  Remove gloves and wash hands immediately after cleaning.  Monitor yourself for signs and symptoms of illness Caregivers and household members are considered close contacts, should monitor their health, and will be asked to limit movement outside of the home to the extent possible. Follow the monitoring steps for close contacts listed on the symptom monitoring form.   ? If you have additional questions, contact your local health department or call the epidemiologist on call at [phone] (available 24/7). ? This guidance is subject to change. For the most up-to-date guidance from [facility], please refer to their website: [email or web address].pl

## 2019-05-16 NOTE — Progress Notes (Signed)
Patient: Cheryl Morgan Female    DOB: 01/05/1996   23 y.o.   MRN: [id number] Visit Date: 05/16/2019  Today's Provider: Margaretann LovelessJennifer M Namine Beahm, PA-C   Chief Complaint  Patient presents with  . Headache  . Shortness of Breath   Subjective:    Virtual Visit via Video Note  I connected with Cheryl ChalkAngela B Buffkin on 05/16/19 at  2:20 PM EDT by a video enabled telemedicine application and verified that I am speaking with the correct person using two identifiers.  Location: Patient: Home Provider: [facility]   I discussed the limitations of evaluation and management by telemedicine and the availability of in person appointments. The patient expressed understanding and agreed to proceed.   HPI Patient with c/o sob,headache, weakness and body ache, nauseous, loss appetite and mild temperature, the highest has been 99.7. Symptoms started yesterday.She reports that she is feeling very warm and cold. Not sure if her thermometer is working. She is taking Tylenol every 4-6 hours. Patient had been at the airport ([place] when [place] Patient traveled to [place] with some friends last Thursday and arrived this Tuesday.  No Known Allergies   Current Outpatient Medications:  .  ethynodiol-ethinyl estradiol (KELNOR 1/35) 1-35 MG-MCG tablet, Take 1 tablet by mouth daily., Disp: 3 Package, Rfl: 3 .  sertraline (ZOLOFT) 50 MG tablet, TAKE 1 TABLET BY MOUTH EVERY DAY, Disp: 90 tablet, Rfl: 0 .  famotidine (PEPCID) 10 MG tablet, Take 10 mg by mouth at bedtime., Disp: , Rfl:  .  sucralfate (CARAFATE) 1 g tablet, Take 1 tablet (1 g total) by mouth 4 (four) times daily -  with meals and at bedtime for 30 days., Disp: 120 tablet, Rfl: 1  Review of Systems  Constitutional: Positive for appetite change, chills and fever.  HENT: Positive for congestion, postnasal drip, rhinorrhea and sinus pain. Negative for ear  pain, sinus pressure, sore throat and trouble swallowing.   Respiratory: Positive for shortness of breath. Negative for cough, chest tightness and wheezing.   Cardiovascular: Negative for chest pain, palpitations and leg swelling.  Gastrointestinal: Positive for nausea. Negative for abdominal pain and vomiting.  Musculoskeletal: Positive for myalgias.  Neurological: Positive for weakness and headaches. Negative for dizziness and light-headedness.    Social History   Tobacco Use  . Smoking status: Current Every Day Smoker    Types: E-cigarettes  . Smokeless tobacco: Never Used  . Tobacco comment: Smoked "regular" cigarettes for approx 3 months in 2019  Substance Use Topics  . Alcohol use: Yes    Comment: occassionally - 1x/mo      Objective:   Temp 98.6 F (37 C) (Axillary)   Wt 152 lb 4.8 oz (69.1 kg)   BMI 23.85 kg/m  Vitals:   05/16/19 1350  Temp: 98.6 F (37 C)  TempSrc: Axillary  Weight: 152 lb 4.8 oz (69.1 kg)     Physical Exam Constitutional:      General: She is not in acute distress.    Appearance: She is not ill-appearing.  Pulmonary:     Effort: Pulmonary effort is normal. No respiratory distress (patient able to talk full sentences; no coughing heard over 15 minute conversation).  Neurological:     Mental Status: She is alert.      No results found for any visits on 05/16/19.     Assessment & Plan  1. Exposure to Covid-19 Virus Will test for covid as below. Enrolled in symptom monitoring through mychart. Recommended Vitamins and minerals that can be used to lessen symptoms also sent to patient via mychart. Importance of isolating expressed to patient. Call if symptoms worsen.  - Novel Coronavirus, NAA ([facility]) - MyChart COVID-19 home monitoring program; Future - Temperature monitoring; Future  2. History of recent air travel See above medical treatment plan. - MyChart COVID-19 home monitoring program; Future - Temperature monitoring; Future   3. Postviral fatigue syndrome See above medical treatment plan. - MyChart COVID-19 home monitoring program; Future - Temperature monitoring; Future  4. Fever, unspecified fever cause See above medical treatment plan. - MyChart COVID-19 home monitoring program; Future - Temperature monitoring; Future   I discussed the assessment and treatment plan with the patient. The patient was provided an opportunity to ask questions and all were answered. The patient agreed with the plan and demonstrated an understanding of the instructions.   The patient was advised to call back or seek an in-person evaluation if the symptoms worsen or if the condition fails to improve as anticipated.  I provided 14 minutes of non-face-to-face time during this encounter.    Mar Daring, PA-C  [facility]

## 2019-05-18 ENCOUNTER — Telehealth: Payer: Self-pay

## 2019-05-18 ENCOUNTER — Encounter (INDEPENDENT_AMBULATORY_CARE_PROVIDER_SITE_OTHER): Payer: Self-pay

## 2019-05-18 NOTE — Telephone Encounter (Signed)
Pt called back to discuss decreased appetite and fatigue. Pt stated that she is very tires and is sleeping a lot. Pt stated that her Mother is waking her up just to make sure she is drinking fluids. Pt stated that she is having nausea. Advised pt to drink fluids and to work up to bland diet. Advised pt to call her PCP to get medication for nausea. Pt given symptoms pf dehydration to be on the lookout for. Advised pt to call 911 if having inability to stand or having to hold on to something to balance herself.  Pt verbalized understanding.

## 2019-05-18 NOTE — Telephone Encounter (Signed)
Called pt to discuss worsening weakness and appetite and LM with call back number [phone]

## 2019-05-19 LAB — NOVEL CORONAVIRUS, NAA: SARS-CoV-2, NAA: NOT DETECTED

## 2019-08-16 ENCOUNTER — Other Ambulatory Visit: Payer: Self-pay

## 2019-08-16 ENCOUNTER — Encounter: Payer: Self-pay | Admitting: Family Medicine

## 2019-08-16 ENCOUNTER — Ambulatory Visit (INDEPENDENT_AMBULATORY_CARE_PROVIDER_SITE_OTHER): Payer: BC Managed Care – PPO | Admitting: Family Medicine

## 2019-08-16 VITALS — BP 82/49 | HR 89 | Temp 98.4°F | Resp 16 | Wt 160.0 lb

## 2019-08-16 DIAGNOSIS — N3091 Cystitis, unspecified with hematuria: Secondary | ICD-10-CM | POA: Diagnosis not present

## 2019-08-16 LAB — POCT URINALYSIS DIPSTICK
Bilirubin, UA: NEGATIVE
Glucose, UA: NEGATIVE
Ketones, UA: NEGATIVE
Nitrite, UA: POSITIVE
Protein, UA: NEGATIVE
Spec Grav, UA: 1.02 (ref 1.010–1.025)
Urobilinogen, UA: 0.2 E.U./dL
pH, UA: 7 (ref 5.0–8.0)

## 2019-08-16 MED ORDER — NITROFURANTOIN MONOHYD MACRO 100 MG PO CAPS: 100.0000 mg | ORAL_CAPSULE | Freq: Two times a day (BID) | ORAL | 0 refills | Status: AC

## 2019-08-16 NOTE — Progress Notes (Signed)
Patient: Cheryl Morgan Female    DOB: [birth year]   23 y.o.   MRN: [id number] Visit Date: 08/16/2019  Today's Provider: Lavon Paganini, MD   Chief Complaint  Patient presents with  . Urinary Tract Infection   Subjective:     HPI Urinary Tract Infection: Patient complains of burning with urination, dysuria and suprapubic pressure She has had symptoms for 2 days. Patient also complains of low back pain. Patient denies fever. Patient does have a history of recurrent UTI.  Patient does not have a history of pyelonephritis.    No Known Allergies   Current Outpatient Medications:  .  ethynodiol-ethinyl estradiol (KELNOR 1/35) 1-35 MG-MCG tablet, Take 1 tablet by mouth daily., Disp: 3 Package, Rfl: 3 .  sertraline (ZOLOFT) 50 MG tablet, TAKE 1 TABLET BY MOUTH EVERY DAY, Disp: 90 tablet, Rfl: 0  Review of Systems  Constitutional: Negative.   Respiratory: Negative.   Cardiovascular: Negative.   Genitourinary: Positive for dysuria, flank pain, frequency, pelvic pain and urgency. Negative for decreased urine volume, difficulty urinating and hematuria.  Neurological: Negative.   Psychiatric/Behavioral: Negative.     Social History   Tobacco Use  . Smoking status: Current Every Day Smoker    Types: E-cigarettes  . Smokeless tobacco: Never Used  . Tobacco comment: Smoked "regular" cigarettes for approx 3 months in 2019  Substance Use Topics  . Alcohol use: Yes    Comment: occassionally - 1x/mo      Objective:   BP (!) 82/49 (BP Location: Left Arm, Patient Position: Sitting, Cuff Size: Normal)   Pulse 89   Temp 98.4 F (36.9 C) (Oral)   Resp 16   Wt 160 lb (72.6 kg)   BMI 25.06 kg/m  Vitals:   08/16/19 0815  BP: (!) 82/49  Pulse: 89  Resp: 16  Temp: 98.4 F (36.9 C)  TempSrc: Oral  Weight: 160 lb (72.6 kg)  Body mass index is 25.06 kg/m.   Physical Exam Vitals signs reviewed.  Constitutional:      General: She is not in acute distress.  Appearance: She is well-developed.  HENT:     Head: Normocephalic and atraumatic.  Eyes:     General: No scleral icterus.    Conjunctiva/sclera: Conjunctivae normal.  Cardiovascular:     Rate and Rhythm: Normal rate and regular rhythm.     Heart sounds: Normal heart sounds. No murmur.  Pulmonary:     Effort: Pulmonary effort is normal. No respiratory distress.     Breath sounds: Normal breath sounds. No wheezing or rales.  Abdominal:     General: There is no distension.     Palpations: Abdomen is soft.     Tenderness: There is abdominal tenderness in the suprapubic area. There is no right CVA tenderness, left CVA tenderness, guarding or rebound.  Skin:    General: Skin is warm and dry.     Capillary Refill: Capillary refill takes less than 2 seconds.     Findings: No rash.  Neurological:     Mental Status: She is alert and oriented to person, place, and time. Mental status is at baseline.  Psychiatric:        Mood and Affect: Mood normal.        Behavior: Behavior normal.      Results for orders placed or performed in visit on 08/16/19  POCT urinalysis dipstick  Result Value Ref Range   Color, UA yellow    Clarity, UA  cloudy    Glucose, UA Negative Negative   Bilirubin, UA Negative    Ketones, UA Negative    Spec Grav, UA 1.020 1.010 - 1.025   Blood, UA Small    pH, UA 7.0 5.0 - 8.0   Protein, UA Negative Negative   Urobilinogen, UA 0.2 0.2 or 1.0 E.U./dL   Nitrite, UA Positive    Leukocytes, UA Small (1+) (A) Negative       Assessment & Plan   1. Cystitis with hematuria - Symptoms and UA consistent with UTI -No systemic symptoms or signs of pyelonephritis - Given hematuria, will send urine micro to confirm and will plan to recheck urine in about 6 weeks after completion of antibiotics to ensure hematuria has cleared -Will start treatment with 5day course of Macrobid after reviewing previous urine cultures -We will send urine culture to confirm sensitivities -  could consider prophylaxis after sexual intercourse in the future -Discussed return precautions  - Urine Culture - Urine Microscopic    Meds ordered this encounter  Medications  . nitrofurantoin, macrocrystal-monohydrate, (MACROBID) 100 MG capsule    Sig: Take 1 capsule (100 mg total) by mouth 2 (two) times daily for 5 days.    Dispense:  10 capsule    Refill:  0     Return if symptoms worsen or fail to improve.   The entirety of the information documented in the History of Present Illness, Review of Systems and Physical Exam were personally obtained by me. Portions of this information were initially documented by Rondel Baton , CMA and reviewed by me for thoroughness and accuracy.    Manning Luna, Marzella Schlein, MD MPH [facility]

## 2019-08-16 NOTE — Patient Instructions (Signed)

## 2019-08-17 LAB — URINALYSIS, MICROSCOPIC ONLY: Casts: NONE SEEN /lpf

## 2019-08-17 LAB — SPECIMEN STATUS REPORT

## 2019-08-19 ENCOUNTER — Telehealth: Payer: Self-pay

## 2019-08-19 LAB — URINE CULTURE

## 2019-08-19 NOTE — Telephone Encounter (Signed)
-----   Message from Virginia Crews, MD sent at 08/19/2019  8:12 AM EST ----- Urine culture confirms UTI that is sensitive to abx prescribed.  Hope she is doing better.

## 2019-08-19 NOTE — Telephone Encounter (Signed)
Pt advised.  She states she is starting to feel better.   Thanks,   Mickel Baas

## 2019-11-11 ENCOUNTER — Telehealth: Payer: Self-pay

## 2019-11-11 NOTE — Telephone Encounter (Signed)
LMTCB to schedule appt. OK for PEC to schedule

## 2019-11-11 NOTE — Telephone Encounter (Signed)
Please advise 

## 2019-11-11 NOTE — Telephone Encounter (Signed)
Needs appt please

## 2019-11-11 NOTE — Telephone Encounter (Signed)
Copied from CRM [id number]. Topic: General - Inquiry >> Nov 11, 2019  8:32 AM Deborha Payment wrote: Reason for CRM: Patient is requesting an STD panel. Call back [phone]

## 2019-11-12 ENCOUNTER — Other Ambulatory Visit: Payer: Self-pay

## 2019-11-12 ENCOUNTER — Encounter: Payer: Self-pay | Admitting: Physician Assistant

## 2019-11-12 ENCOUNTER — Ambulatory Visit (INDEPENDENT_AMBULATORY_CARE_PROVIDER_SITE_OTHER): Payer: BC Managed Care – PPO | Admitting: Physician Assistant

## 2019-11-12 VITALS — BP 117/63 | HR 114 | Temp 96.9°F | Wt 166.6 lb

## 2019-11-12 DIAGNOSIS — Z124 Encounter for screening for malignant neoplasm of cervix: Secondary | ICD-10-CM | POA: Diagnosis not present

## 2019-11-12 DIAGNOSIS — N898 Other specified noninflammatory disorders of vagina: Secondary | ICD-10-CM | POA: Diagnosis not present

## 2019-11-12 DIAGNOSIS — Z202 Contact with and (suspected) exposure to infections with a predominantly sexual mode of transmission: Secondary | ICD-10-CM

## 2019-11-12 MED ORDER — CEFTRIAXONE SODIUM 500 MG IJ SOLR
500.0000 mg | Freq: Once | INTRAMUSCULAR | Status: AC
  Administered 2019-11-12: 15:00:00 500 mg via INTRAMUSCULAR

## 2019-11-12 MED ORDER — AZITHROMYCIN 500 MG PO TABS: 1000.0000 mg | ORAL_TABLET | Freq: Every day | ORAL | 0 refills | Status: AC

## 2019-11-12 NOTE — Progress Notes (Signed)
Patient: Cheryl Morgan Female    DOB: 1996/04/17   23 y.o.   MRN: [id number] Visit Date: 11/12/2019  Today's Provider: Trey Sailors, PA-C   Chief Complaint  Patient presents with  . Exposure to STD   Subjective:     Exposure to STD  The patient's pertinent negatives include no discharge, genital itching, genital lesions, genital rash or pelvic pain. This is a new problem. The vaginal discharge was normal. Associate symptoms include a genital odor. Pertinent negatives include no abdominal pain. She has tried nothing for the symptoms. The treatment provided no relief.  Patient states that her boyfriend is having symptoms: discharge and pain during urination. Reports her partner tested positive for gonorrhea.   No Known Allergies   Current Outpatient Medications:  .  ethynodiol-ethinyl estradiol (KELNOR 1/35) 1-35 MG-MCG tablet, Take 1 tablet by mouth daily., Disp: 3 Package, Rfl: 3 .  sertraline (ZOLOFT) 50 MG tablet, TAKE 1 TABLET BY MOUTH EVERY DAY, Disp: 90 tablet, Rfl: 0  Review of Systems  Gastrointestinal: Negative for abdominal pain.  Genitourinary: Negative for pelvic pain.    Social History   Tobacco Use  . Smoking status: Current Every Day Smoker    Types: E-cigarettes  . Smokeless tobacco: Never Used  . Tobacco comment: Smoked "regular" cigarettes for approx 3 months in 2019  Substance Use Topics  . Alcohol use: Yes    Comment: occassionally - 1x/mo      Objective:   BP 117/63 (BP Location: Left Arm, Patient Position: Sitting, Cuff Size: Normal)   Pulse (!) 114   Temp (!) 96.9 F (36.1 C) (Temporal)   Wt 166 lb 9.6 oz (75.6 kg)   LMP 10/24/2019 (Exact Date)   BMI 26.09 kg/m  Vitals:   11/12/19 1351  BP: 117/63  Pulse: (!) 114  Temp: (!) 96.9 F (36.1 C)  TempSrc: Temporal  Weight: 166 lb 9.6 oz (75.6 kg)  Body mass index is 26.09 kg/m.   Physical Exam Constitutional:      Appearance: Normal appearance.  Cardiovascular:   Rate and Rhythm: Normal rate.  Pulmonary:     Effort: Pulmonary effort is normal.  Genitourinary:    Exam position: Lithotomy position.     Vagina: Vaginal discharge present.     Cervix: Normal.     Comments: Copious vaginal discharge.  Skin:    General: Skin is warm and dry.  Neurological:     Mental Status: She is alert and oriented to person, place, and time. Mental status is at baseline.  Psychiatric:        Mood and Affect: Mood normal.        Behavior: Behavior normal.      No results found for any visits on 11/12/19.     Assessment & Plan    1. STD exposure  Based on positive exposure to gonorrhea and clinical presentation today, will treat empirically for gonorrhea and chlamydia with rocephin here in office and single dose azithromycin.   - azithromycin (ZITHROMAX) 500 MG tablet; Take 2 tablets (1,000 mg total) by mouth daily for 1 day.  Dispense: 2 tablet; Refill: 0 - cefTRIAXone (ROCEPHIN) injection 500 mg  2. Cervical cancer screening  - Pap IG w/ reflex to HPV when ASC-U - IGP, rfx Aptima HPV ASCU  3. Vaginal discharge  - NuSwab Vaginitis Plus (VG+) - azithromycin (ZITHROMAX) 500 MG tablet; Take 2 tablets (1,000 mg total) by mouth daily for 1 day.  Dispense: 2 tablet; Refill: 0 - cefTRIAXone (ROCEPHIN) injection 500 mg  The entirety of the information documented in the History of Present Illness, Review of Systems and Physical Exam were personally obtained by me. Portions of this information were initially documented by [facility] and reviewed by me for thoroughness and accuracy.   I have spent 25 minutes with this patient, >50% of which was spent on counseling and coordination of care.      Trinna Post, PA-C  [facility]

## 2019-11-14 LAB — IGP, RFX APTIMA HPV ASCU: PAP Smear Comment: 0

## 2019-11-15 ENCOUNTER — Telehealth: Payer: Self-pay | Admitting: Physician Assistant

## 2019-11-15 DIAGNOSIS — B9689 Other specified bacterial agents as the cause of diseases classified elsewhere: Secondary | ICD-10-CM

## 2019-11-15 DIAGNOSIS — N760 Acute vaginitis: Secondary | ICD-10-CM

## 2019-11-15 LAB — NUSWAB VAGINITIS PLUS (VG+)
Candida albicans, NAA: NEGATIVE
Candida glabrata, NAA: NEGATIVE
Chlamydia trachomatis, NAA: NEGATIVE
Megasphaera 1: HIGH {score} — AB
Neisseria gonorrhoeae, NAA: NEGATIVE
Trich vag by NAA: NEGATIVE

## 2019-11-15 NOTE — Telephone Encounter (Signed)
Called patient no answer and left voicemail for patient to return call.

## 2019-11-15 NOTE — Telephone Encounter (Signed)
Patient is returning a call regarding her test results.  CB# [phone]

## 2019-11-15 NOTE — Telephone Encounter (Signed)
Patient is calling to ask advise on her labs - her STD panel Please advise CB- [phone]

## 2019-11-18 ENCOUNTER — Telehealth: Payer: Self-pay | Admitting: *Deleted

## 2019-11-18 MED ORDER — METRONIDAZOLE 500 MG PO TABS: 500.0000 mg | ORAL_TABLET | Freq: Two times a day (BID) | ORAL | 0 refills | Status: AC

## 2019-11-18 NOTE — Telephone Encounter (Signed)
Patient was advised via voicemail per DPR that medication was send into pharmacy.

## 2019-11-18 NOTE — Telephone Encounter (Signed)
Patient had questions regarding lab results from last week. Discussed results and the physician's note with the patient. She does have a vaginal odor and would like the antibiotic called to her pharmacy on file- [facility] on [address], please. Routing to pcp for consideration.

## 2019-11-18 NOTE — Telephone Encounter (Signed)
Flagyl 500 mg BID x 7 days sent to [facility] on [address].

## 2019-11-18 NOTE — Telephone Encounter (Signed)
Patient was calling to advise Cheryl Morgan that she would like to have the antibiotic send into the pharmacy for BV.

## 2019-11-18 NOTE — Telephone Encounter (Signed)
Cheryl Morgan was advised via another message.

## 2020-01-08 ENCOUNTER — Other Ambulatory Visit: Payer: Self-pay

## 2020-01-08 ENCOUNTER — Ambulatory Visit (INDEPENDENT_AMBULATORY_CARE_PROVIDER_SITE_OTHER): Payer: BC Managed Care – PPO | Admitting: Family Medicine

## 2020-01-08 ENCOUNTER — Encounter: Payer: Self-pay | Admitting: Family Medicine

## 2020-01-08 VITALS — BP 95/67 | HR 73 | Temp 97.1°F | Resp 16 | Wt 166.0 lb

## 2020-01-08 DIAGNOSIS — N898 Other specified noninflammatory disorders of vagina: Secondary | ICD-10-CM | POA: Diagnosis not present

## 2020-01-08 DIAGNOSIS — B373 Candidiasis of vulva and vagina: Secondary | ICD-10-CM | POA: Diagnosis not present

## 2020-01-08 DIAGNOSIS — B3731 Acute candidiasis of vulva and vagina: Secondary | ICD-10-CM

## 2020-01-08 MED ORDER — FLUCONAZOLE 150 MG PO TABS: 150.0000 mg | ORAL_TABLET | Freq: Once | ORAL | 0 refills | Status: AC

## 2020-01-08 NOTE — Progress Notes (Signed)
Patient: Cheryl Morgan Female    DOB: [birth year]   24 y.o.   MRN: [id number] Visit Date: 01/08/2020  Today's Provider: Lavon Paganini, MD   Chief Complaint  Patient presents with  . Vaginal Discharge   Subjective:     HPI Patient here today C/O vaginal discharge and irritation x's several days. Patient reports she did use OTC monistat and reports no improvement.   Discharge is thick and cottage cheeselike.  She has dyspareunia.  Initially with odor, but no longer.  Very irritated and itchy on her vulva.  No Known Allergies   Current Outpatient Medications:  .  ethynodiol-ethinyl estradiol (KELNOR 1/35) 1-35 MG-MCG tablet, Take 1 tablet by mouth daily. (Patient not taking: Reported on 01/08/2020), Disp: 3 Package, Rfl: 3 .  sertraline (ZOLOFT) 50 MG tablet, TAKE 1 TABLET BY MOUTH EVERY DAY (Patient not taking: Reported on 01/08/2020), Disp: 90 tablet, Rfl: 0  Review of Systems  Constitutional: Negative for chills and fever.  Respiratory: Negative for cough.   Cardiovascular: Negative for chest pain and palpitations.  Genitourinary: Positive for vaginal discharge. Negative for difficulty urinating and dysuria.  Skin: Positive for rash.    Social History   Tobacco Use  . Smoking status: Current Every Day Smoker    Types: E-cigarettes  . Smokeless tobacco: Never Used  . Tobacco comment: Smoked "regular" cigarettes for approx 3 months in 2019  Substance Use Topics  . Alcohol use: Yes    Comment: occassionally - 1x/mo      Objective:   BP 95/67 (BP Location: Left Arm, Patient Position: Sitting, Cuff Size: Normal)   Pulse 73   Temp (!) 97.1 F (36.2 C) (Temporal)   Resp 16   Wt 166 lb (75.3 kg)   BMI 26.00 kg/m  Vitals:   01/08/20 0926  BP: 95/67  Pulse: 73  Resp: 16  Temp: (!) 97.1 F (36.2 C)  TempSrc: Temporal  Weight: 166 lb (75.3 kg)  Body mass index is 26 kg/m.   Physical Exam Constitutional:      Appearance: Normal appearance.    Cardiovascular:     Rate and Rhythm: Normal rate and regular rhythm.  Pulmonary:     Effort: Pulmonary effort is normal. No respiratory distress.  Genitourinary:    Comments: Erythematous and irritated vulva.  Vaginal mucosa pink, moist, normal rugae.  Nonfriable cervix without lesions, no bleeding noted on speculum exam.  Pain with insertion of small speculum.  Thick white discharge noted in vagina    Neurological:     Mental Status: She is alert and oriented to person, place, and time. Mental status is at baseline.  Psychiatric:        Mood and Affect: Mood normal.        Behavior: Behavior normal.      No results found for any visits on 01/08/20.     Assessment & Plan    1. Vaginal candidiasis 2. Vaginal discharge -Discharge appears consistent with vaginal yeast infection on exam today -Given history of BV, will also send new swab to rule out coinfections -Treat with Diflucan x2 -Can use OTC yeast creams for external irritation -Advised that despair anemia should resolve with treatment of infection -Discussed return precautions - NuSwab Vaginitis Plus (VG+)    Meds ordered this encounter  Medications  . fluconazole (DIFLUCAN) 150 MG tablet    Sig: Take 1 tablet (150 mg total) by mouth once for 1 dose. May repeat in 3  days if symptoms not resolved    Dispense:  2 tablet    Refill:  0     Return if symptoms worsen or fail to improve.   The entirety of the information documented in the History of Present Illness, Review of Systems and Physical Exam were personally obtained by me. Portions of this information were initially documented by Rondel Baton, CMA and reviewed by me for thoroughness and accuracy.    Terrell Shimko, Marzella Schlein, MD MPH [facility]

## 2020-01-08 NOTE — Patient Instructions (Signed)
Vaginal Yeast Infection, Adult  Vaginal yeast infection is a condition that causes vaginal discharge as well as soreness, swelling, and redness (inflammation) of the vagina. This is a common condition. Some women get this infection frequently. What are the causes? This condition is caused by a change in the normal balance of the yeast (candida) and bacteria that live in the vagina. This change causes an overgrowth of yeast, which causes the inflammation. What increases the risk? The condition is more likely to develop in women who:  Take antibiotic medicines.  Have diabetes.  Take birth control pills.  Are pregnant.  Douche often.  Have a weak body defense system (immune system).  Have been taking steroid medicines for a long time.  Frequently wear tight clothing. What are the signs or symptoms? Symptoms of this condition include:  White, thick, creamy vaginal discharge.  Swelling, itching, redness, and irritation of the vagina. The lips of the vagina (vulva) may be affected as well.  Pain or a burning feeling while urinating.  Pain during sex. How is this diagnosed? This condition is diagnosed based on:  Your medical history.  A physical exam.  A pelvic exam. Your health care provider will examine a sample of your vaginal discharge under a microscope. Your health care provider may send this sample for testing to confirm the diagnosis. How is this treated? This condition is treated with medicine. Medicines may be over-the-counter or prescription. You may be told to use one or more of the following:  Medicine that is taken by mouth (orally).  Medicine that is applied as a cream (topically).  Medicine that is inserted directly into the vagina (suppository). Follow these instructions at home:  Lifestyle  Do not have sex until your health care provider approves. Tell your sex partner that you have a yeast infection. That person should go to his or her health care  provider and ask if they should also be treated.  Do not wear tight clothes, such as pantyhose or tight pants.  Wear breathable cotton underwear. General instructions  Take or apply over-the-counter and prescription medicines only as told by your health care provider.  Eat more yogurt. This may help to keep your yeast infection from returning.  Do not use tampons until your health care provider approves.  Try taking a sitz bath to help with discomfort. This is a warm water bath that is taken while you are sitting down. The water should only come up to your hips and should cover your buttocks. Do this 3-4 times per day or as told by your health care provider.  Do not douche.  If you have diabetes, keep your blood sugar levels under control.  Keep all follow-up visits as told by your health care provider. This is important. Contact a health care provider if:  You have a fever.  Your symptoms go away and then return.  Your symptoms do not get better with treatment.  Your symptoms get worse.  You have new symptoms.  You develop blisters in or around your vagina.  You have blood coming from your vagina and it is not your menstrual period.  You develop pain in your abdomen. Summary  Vaginal yeast infection is a condition that causes discharge as well as soreness, swelling, and redness (inflammation) of the vagina.  This condition is treated with medicine. Medicines may be over-the-counter or prescription.  Take or apply over-the-counter and prescription medicines only as told by your health care provider.  Do not douche.   Do not have sex or use tampons until your health care provider approves.  Contact a health care provider if your symptoms do not get better with treatment or your symptoms go away and then return. This information is not intended to replace advice given to you by your health care provider. Make sure you discuss any questions you have with your health care  provider. Document Revised: 05/03/2019 Document Reviewed: 02/19/2018 Elsevier Patient Education  2020 [employer]  

## 2020-01-11 LAB — NUSWAB VAGINITIS PLUS (VG+)
Atopobium vaginae: HIGH Score — AB
Candida albicans, NAA: POSITIVE — AB
Candida glabrata, NAA: NEGATIVE
Chlamydia trachomatis, NAA: NEGATIVE
Neisseria gonorrhoeae, NAA: NEGATIVE
Trich vag by NAA: NEGATIVE

## 2020-01-13 ENCOUNTER — Telehealth: Payer: Self-pay

## 2020-01-13 NOTE — Telephone Encounter (Signed)
Comment seen by patient Cheryl Morgan on 01/13/2020 9:22 AM EDT. Left detailed message for patient to call back if needs antibiotic. Per DPR

## 2020-01-13 NOTE — Telephone Encounter (Signed)
-----   Message from Erasmo Downer, MD sent at 01/13/2020  9:06 AM EDT ----- Swab I borderline for bacterial vaginosis and positive for yeast.  We already treated yeast, which is primarily what was seen on exam.  If discharge is better, would not treat BV, but if still present, would start Metronidazole 500mg  BID x7d.

## 2020-01-24 ENCOUNTER — Ambulatory Visit
Admission: RE | Admit: 2020-01-24 | Discharge: 2020-01-24 | Disposition: A | Discharge status: Home / Self Care | Payer: BC Managed Care – PPO | Source: Ambulatory Visit | Attending: Physician Assistant | Admitting: Physician Assistant

## 2020-01-24 ENCOUNTER — Other Ambulatory Visit: Payer: Self-pay

## 2020-01-24 ENCOUNTER — Ambulatory Visit (INDEPENDENT_AMBULATORY_CARE_PROVIDER_SITE_OTHER): Payer: BC Managed Care – PPO | Admitting: Physician Assistant

## 2020-01-24 ENCOUNTER — Encounter: Payer: Self-pay | Admitting: Physician Assistant

## 2020-01-24 ENCOUNTER — Telehealth: Payer: Self-pay

## 2020-01-24 DIAGNOSIS — Z8709 Personal history of other diseases of the respiratory system: Secondary | ICD-10-CM

## 2020-01-24 DIAGNOSIS — R0789 Other chest pain: Secondary | ICD-10-CM

## 2020-01-24 DIAGNOSIS — R05 Cough: Secondary | ICD-10-CM | POA: Diagnosis present

## 2020-01-24 DIAGNOSIS — R059 Cough, unspecified: Secondary | ICD-10-CM

## 2020-01-24 DIAGNOSIS — J189 Pneumonia, unspecified organism: Secondary | ICD-10-CM | POA: Diagnosis not present

## 2020-01-24 MED ORDER — AZITHROMYCIN 250 MG PO TABS: ORAL_TABLET | ORAL | 0 refills | Status: DC

## 2020-01-24 NOTE — Telephone Encounter (Signed)
Comment seen by patient Cheryl Morgan on 01/24/2020 12:43 PM EDT.

## 2020-01-24 NOTE — Progress Notes (Signed)
Virtual telephone visit      Virtual Visit via Telephone Note   This visit type was conducted due to national recommendations for restrictions regarding the COVID-19 Pandemic (e.g. social distancing) in an effort to limit this patient's exposure and mitigate transmission in our community. Due to her co-morbid illnesses, this patient is at least at moderate risk for complications without adequate follow up. This format is felt to be most appropriate for this patient at this time. The patient did not have access to video technology or had technical difficulties with video requiring transitioning to audio format only (telephone). Physical exam was limited to content and character of the telephone converstion.    Patient location: Home Provider location: [facility]     Patient: Cheryl Morgan   DOB: [birth year]   24 y.o. Female  MRN: [id number] Visit Date: 01/24/2020  Today's Provider: Mar Daring, PA-C  Subjective:    Chief Complaint  Patient presents with  . URI   URI  This is a new problem. The current episode started in the past 7 days (Started this Tuesday). The problem has been unchanged. There has been no fever. Associated symptoms include congestion, coughing, ear pain, headaches and sinus pain. Pertinent negatives include no sore throat, vomiting or wheezing. She has tried antihistamine (Nyquil, Benadryl, Tylenol) for the symptoms.    Patient Active Problem List   Diagnosis Date Noted  . Dyspepsia   . Rectal bleeding   . 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 Dysmenorrhea 08/01/2012  . Excessive and frequent menstruation 08/01/2012   Past Medical History:  Diagnosis Date  . GERD (gastroesophageal reflux disease)   . Motion sickness    circular motion  . Seasonal allergies   . Weight gain 08/01/2012      Medications: Outpatient Medications Prior to Visit  Medication Sig  . ethynodiol-ethinyl estradiol Marliss Coots 1/35)  1-35 MG-MCG tablet Take 1 tablet by mouth daily. (Patient not taking: Reported on 01/08/2020)  . sertraline (ZOLOFT) 50 MG tablet TAKE 1 TABLET BY MOUTH EVERY DAY (Patient not taking: Reported on 01/08/2020)   No facility-administered medications prior to visit.    Review of Systems  Constitutional: Positive for fatigue.  HENT: Positive for congestion, ear pain, postnasal drip, sinus pressure and sinus pain. Negative for sore throat.   Respiratory: Positive for cough and shortness of breath. Negative for wheezing.   Gastrointestinal: Negative for vomiting.  Neurological: Positive for headaches.    Last CBC Lab Results  Component Value Date   WBC 13.0 (H) 11/15/2018   HGB 14.7 11/15/2018   HCT 41.8 11/15/2018   MCV 95 11/15/2018   MCH 33.3 (H) 11/15/2018   RDW 11.5 (L) 11/15/2018   PLT 310 83/15/1761   Last metabolic panel Lab Results  Component Value Date   GLUCOSE 90 11/15/2018   NA 137 11/15/2018   K 4.0 11/15/2018   CL 98 11/15/2018   CO2 23 11/15/2018   BUN 8 11/15/2018   CREATININE 0.69 11/15/2018   GFRNONAA 123 11/15/2018   GFRAA 142 11/15/2018   CALCIUM 9.5 11/15/2018   PROT 7.2 11/15/2018   ALBUMIN 4.7 11/15/2018   LABGLOB 2.5 11/15/2018   AGRATIO 1.9 11/15/2018   BILITOT 0.7 11/15/2018   ALKPHOS 78 11/15/2018   AST 14 11/15/2018   ALT 7 11/15/2018   ANIONGAP 8 12/10/2011        Objective:    There were no vitals taken for this visit. BP  Readings from Last 3 Encounters:  01/08/20 95/67  11/12/19 117/63  08/16/19 (!) 82/49      Patient did not sound to be in any apparent distress. No issues talking in full sentences.   CLINICAL DATA:  Cough and chills.  Chest tightness.  EXAM: CHEST - 2 VIEW  COMPARISON:  08/22/2017.  FINDINGS: Mediastinum hilar structures normal. Mild left perihilar infiltrate. No pleural effusion or pneumothorax. Thoracic spine scoliosis and degenerative change.  IMPRESSION: Mild left perihilar infiltrate  suggesting pneumonia.   Electronically Signed   By: Maisie Fus  Register   On: 01/24/2020 11:57    Assessment & Plan:    1. Cough Continue symptomatic treatment. Will get CXR to R/O pneumonia. Tested negative for covid 19 on 01/22/20 - DG Chest 2 View; Future  2. Chest tightness See above medical treatment plan. - DG Chest 2 View; Future  3. History of asthma When she was 25-25 years old.  - DG Chest 2 View; Future  4. Pneumonia of left lung due to infectious organism, unspecified part of lung Pneumonia noted on CXR. Zpak sent to cover atypicals. Call if worsening.  - azithromycin (ZITHROMAX) 250 MG tablet; Take 2 tablets PO on day one, and one tablet PO daily thereafter until completed.  Dispense: 6 tablet; Refill: 0     I discussed the assessment and treatment plan with the patient. The patient was provided an opportunity to ask questions and all were answered. The patient agreed with the plan and demonstrated an understanding of the instructions.   The patient was advised to call back or seek an in-person evaluation if the symptoms worsen or if the condition fails to improve as anticipated.  I provided 10 minutes of non-face-to-face time during this encounter.   Reine Just  [facility] [phone] (phone) [phone] (fax)  [facility]

## 2020-01-24 NOTE — Telephone Encounter (Signed)
-----   Message from Margaretann Loveless, [place] sent at 01/24/2020 12:08 PM EDT ----- CXR is positive for pneumonia of the left lung. Will send in zpak to treat. Please call if symptoms worsen.

## 2020-01-26 NOTE — Patient Instructions (Signed)
Community-Acquired Pneumonia, Adult Pneumonia is an infection of the lungs. It causes swelling in the airways of the lungs. Mucus and fluid may also build up inside the airways. One type of pneumonia can happen while a person is in a hospital. A different type can happen when a person is not in a hospital (community-acquired pneumonia).  What are the causes?  This condition is caused by germs (viruses, bacteria, or fungi). Some types of germs can be passed from one person to another. This can happen when you breathe in droplets from the cough or sneeze of an infected person. What increases the risk? You are more likely to develop this condition if you:  Have a long-term (chronic) disease, such as: ? Chronic obstructive pulmonary disease (COPD). ? Asthma. ? Cystic fibrosis. ? Congestive heart failure. ? Diabetes. ? Kidney disease.  Have HIV.  Have sickle cell disease.  Have had your spleen removed.  Do not take good care of your teeth and mouth (poor dental hygiene).  Have a medical condition that increases the risk of breathing in droplets from your own mouth and nose.  Have a weakened body defense system (immune system).  Are a smoker.  Travel to areas where the germs that cause this illness are common.  Are around certain animals or the places they live. What are the signs or symptoms?  A dry cough.  A wet (productive) cough.  Fever.  Sweating.  Chest pain. This often happens when breathing deeply or coughing.  Fast breathing or trouble breathing.  Shortness of breath.  Shaking chills.  Feeling tired (fatigue).  Muscle aches. How is this treated? Treatment for this condition depends on many things. Most adults can be treated at home. In some cases, treatment must happen in a hospital. Treatment may include:  Medicines given by mouth or through an IV tube.  Being given extra oxygen.  Respiratory therapy. In rare cases, treatment for very bad pneumonia  may include:  Using a machine to help you breathe.  Having a procedure to remove fluid from around your lungs. Follow these instructions at home: Medicines  Take over-the-counter and prescription medicines only as told by your doctor. ? Only take cough medicine if you are losing sleep.  If you were prescribed an antibiotic medicine, take it as told by your doctor. Do not stop taking the antibiotic even if you start to feel better. General instructions   Sleep with your head and neck raised (elevated). You can do this by sleeping in a recliner or by putting a few pillows under your head.  Rest as needed. Get at least 8 hours of sleep each night.  Drink enough water to keep your pee (urine) pale yellow.  Eat a healthy diet that includes plenty of vegetables, fruits, whole grains, low-fat dairy products, and lean protein.  Do not use any products that contain nicotine or tobacco. These include cigarettes, e-cigarettes, and chewing tobacco. If you need help quitting, ask your doctor.  Keep all follow-up visits as told by your doctor. This is important. How is this prevented? A shot (vaccine) can help prevent pneumonia. Shots are often suggested for:  People older than 25 years of age.  People older than 25 years of age who: ? Are having cancer treatment. ? Have long-term (chronic) lung disease. ? Have problems with their body's defense system. You may also prevent pneumonia if you take these actions:  Get the flu (influenza) shot every year.  Go to the dentist as   often as told.  Wash your hands often. If you cannot use soap and water, use hand sanitizer. Contact a doctor if:  You have a fever.  You lose sleep because your cough medicine does not help. Get help right away if:  You are short of breath and it gets worse.  You have more chest pain.  Your sickness gets worse. This is very serious if: ? You are an older adult. ? Your body's defense system is weak.  You  cough up blood. Summary  Pneumonia is an infection of the lungs.  Most adults can be treated at home. Some will need treatment in a hospital.  Drink enough water to keep your pee pale yellow.  Get at least 8 hours of sleep each night. This information is not intended to replace advice given to you by your health care provider. Make sure you discuss any questions you have with your health care provider. Document Revised: 01/23/2019 Document Reviewed: 05/31/2018 Elsevier Patient Education  2020 [employer]  

## 2020-01-27 ENCOUNTER — Telehealth: Payer: Self-pay

## 2020-01-27 NOTE — Telephone Encounter (Signed)
Copied from CRM [id number]. Topic: General - Other >> Jan 27, 2020 11:06 AM Gwenlyn Fudge wrote: Reason for CRM: Pt called and is requesting to have a letter excusing her from work on 01/24/20. Pt states that she had a virtual visit with PCP and was told it would be okay. Pt is requesting to have this not today before work at 12. Please advise.

## 2020-01-27 NOTE — Telephone Encounter (Signed)
Done and sent to my chart

## 2020-05-21 ENCOUNTER — Encounter: Payer: Self-pay | Admitting: Adult Health

## 2020-05-21 ENCOUNTER — Telehealth (INDEPENDENT_AMBULATORY_CARE_PROVIDER_SITE_OTHER): Payer: BC Managed Care – PPO | Admitting: Adult Health

## 2020-05-21 DIAGNOSIS — R0789 Other chest pain: Secondary | ICD-10-CM | POA: Diagnosis not present

## 2020-05-21 DIAGNOSIS — Z8709 Personal history of other diseases of the respiratory system: Secondary | ICD-10-CM

## 2020-05-21 DIAGNOSIS — U071 COVID-19: Secondary | ICD-10-CM | POA: Insufficient documentation

## 2020-05-21 DIAGNOSIS — R05 Cough: Secondary | ICD-10-CM | POA: Diagnosis not present

## 2020-05-21 DIAGNOSIS — R059 Cough, unspecified: Secondary | ICD-10-CM | POA: Insufficient documentation

## 2020-05-21 DIAGNOSIS — R5383 Other fatigue: Secondary | ICD-10-CM | POA: Insufficient documentation

## 2020-05-21 DIAGNOSIS — R062 Wheezing: Secondary | ICD-10-CM | POA: Insufficient documentation

## 2020-05-21 DIAGNOSIS — Z8701 Personal history of pneumonia (recurrent): Secondary | ICD-10-CM | POA: Insufficient documentation

## 2020-05-21 HISTORY — DX: Personal history of pneumonia (recurrent): Z87.01

## 2020-05-21 HISTORY — DX: COVID-19: U07.1

## 2020-05-21 HISTORY — DX: Personal history of other diseases of the respiratory system: Z87.09

## 2020-05-21 MED ORDER — ALBUTEROL SULFATE (2.5 MG/3ML) 0.083% IN NEBU: 2.5000 mg | INHALATION_SOLUTION | Freq: Four times a day (QID) | RESPIRATORY_TRACT | 0 refills | Status: DC | PRN

## 2020-05-21 MED ORDER — PREDNISONE 10 MG (21) PO TBPK: ORAL_TABLET | ORAL | 0 refills | Status: DC

## 2020-05-21 MED ORDER — BENZONATATE 100 MG PO CAPS: 100.0000 mg | ORAL_CAPSULE | Freq: Three times a day (TID) | ORAL | 0 refills | Status: DC | PRN

## 2020-05-21 MED ORDER — AZITHROMYCIN 250 MG PO TABS: ORAL_TABLET | ORAL | 0 refills | Status: DC

## 2020-05-21 NOTE — Patient Instructions (Signed)
Upper Respiratory Infection, Adult An upper respiratory infection (URI) affects the nose, throat, and upper air passages. URIs are caused by germs (viruses). The most common type of URI is often called "the common cold." Medicines cannot cure URIs, but you can do things at home to relieve your symptoms. URIs usually get better within 7-10 days. Follow these instructions at home: Activity  Rest as needed.  If you have a fever, stay home from work or school until your fever is gone, or until your doctor says you may return to work or school. ? You should stay home until you cannot spread the infection anymore (you are not contagious). ? Your doctor may have you wear a face mask so you have less risk of spreading the infection. Relieving symptoms  Gargle with a salt-water mixture 3-4 times a day or as needed. To make a salt-water mixture, completely dissolve -1 tsp of salt in 1 cup of warm water.  Use a cool-mist humidifier to add moisture to the air. This can help you breathe more easily. Eating and drinking   Drink enough fluid to keep your pee (urine) pale yellow.  Eat soups and other clear broths. General instructions   Take over-the-counter and prescription medicines only as told by your doctor. These include cold medicines, fever reducers, and cough suppressants.  Do not use any products that contain nicotine or tobacco. These include cigarettes and e-cigarettes. If you need help quitting, ask your doctor.  Avoid being where people are smoking (avoid secondhand smoke).  Make sure you get regular shots and get the flu shot every year.  Keep all follow-up visits as told by your doctor. This is important. How to avoid spreading infection to others   Wash your hands often with soap and water. If you do not have soap and water, use hand sanitizer.  Avoid touching your mouth, face, eyes, or nose.  Cough or sneeze into a tissue or your sleeve or elbow. Do not cough or sneeze  into your hand or into the air. Contact a doctor if:  You are getting worse, not better.  You have any of these: ? A fever. ? Chills. ? Brown or red mucus in your nose. ? Yellow or brown fluid (discharge)coming from your nose. ? Pain in your face, especially when you bend forward. ? Swollen neck glands. ? Pain with swallowing. ? White areas in the back of your throat. Get help right away if:  You have shortness of breath that gets worse.  You have very bad or constant: ? Headache. ? Ear pain. ? Pain in your forehead, behind your eyes, and over your cheekbones (sinus pain). ? Chest pain.  You have long-lasting (chronic) lung disease along with any of these: ? Wheezing. ? Long-lasting cough. ? Coughing up blood. ? A change in your usual mucus.  You have a stiff neck.  You have changes in your: ? Vision. ? Hearing. ? Thinking. ? Mood. Summary  An upper respiratory infection (URI) is caused by a germ called a virus. The most common type of URI is often called "the common cold."  URIs usually get better within 7-10 days.  Take over-the-counter and prescription medicines only as told by your doctor. This information is not intended to replace advice given to you by your health care provider. Make sure you discuss any questions you have with your health care provider. Document Revised: 10/11/2018 Document Reviewed: 05/26/2017 Elsevier Patient Education  2020 [employer]. COVID-19 COVID-19 is a  respiratory infection that is caused by a virus called severe acute respiratory syndrome coronavirus 2 (SARS-CoV-2). The disease is also known as coronavirus disease or novel coronavirus. In some people, the virus may not cause any symptoms. In others, it may cause a serious infection. The infection can get worse quickly and can lead to complications, such as:  Pneumonia, or infection of the lungs.  Acute respiratory distress syndrome or ARDS. This is a condition in which fluid  build-up in the lungs prevents the lungs from filling with air and passing oxygen into the blood.  Acute respiratory failure. This is a condition in which there is not enough oxygen passing from the lungs to the body or when carbon dioxide is not passing from the lungs out of the body.  Sepsis or septic shock. This is a serious bodily reaction to an infection.  Blood clotting problems.  Secondary infections due to bacteria or fungus.  Organ failure. This is when your body's organs stop working. The virus that causes COVID-19 is contagious. This means that it can spread from person to person through droplets from coughs and sneezes (respiratory secretions). What are the causes? This illness is caused by a virus. You may catch the virus by:  Breathing in droplets from an infected person. Droplets can be spread by a person breathing, speaking, singing, coughing, or sneezing.  Touching something, like a table or a doorknob, that was exposed to the virus (contaminated) and then touching your mouth, nose, or eyes. What increases the risk? Risk for infection You are more likely to be infected with this virus if you:  Are within 6 feet (2 meters) of a person with COVID-19.  Provide care for or live with a person who is infected with COVID-19.  Spend time in crowded indoor spaces or live in shared housing. Risk for serious illness You are more likely to become seriously ill from the virus if you:  Are 56 years of age or older. The higher your age, the more you are at risk for serious illness.  Live in a nursing home or long-term care facility.  Have cancer.  Have a long-term (chronic) disease such as: ? Chronic lung disease, including chronic obstructive pulmonary disease or asthma. ? A long-term disease that lowers your body's ability to fight infection (immunocompromised). ? Heart disease, including heart failure, a condition in which the arteries that lead to the heart become narrow  or blocked (coronary artery disease), a disease which makes the heart muscle thick, weak, or stiff (cardiomyopathy). ? Diabetes. ? Chronic kidney disease. ? Sickle cell disease, a condition in which red blood cells have an abnormal "sickle" shape. ? Liver disease.  Are obese. What are the signs or symptoms? Symptoms of this condition can range from mild to severe. Symptoms may appear any time from 2 to 14 days after being exposed to the virus. They include:  A fever or chills.  A cough.  Difficulty breathing.  Headaches, body aches, or muscle aches.  Runny or stuffy (congested) nose.  A sore throat.  New loss of taste or smell. Some people may also have stomach problems, such as nausea, vomiting, or diarrhea. Other people may not have any symptoms of COVID-19. How is this diagnosed? This condition may be diagnosed based on:  Your signs and symptoms, especially if: ? You live in an area with a COVID-19 outbreak. ? You recently traveled to or from an area where the virus is common. ? You provide care for  or live with a person who was diagnosed with COVID-19. ? You were exposed to a person who was diagnosed with COVID-19.  A physical exam.  Lab tests, which may include: ? Taking a sample of fluid from the back of your nose and throat (nasopharyngeal fluid), your nose, or your throat using a swab. ? A sample of mucus from your lungs (sputum). ? Blood tests.  Imaging tests, which may include, X-rays, CT scan, or ultrasound. How is this treated? At present, there is no medicine to treat COVID-19. Medicines that treat other diseases are being used on a trial basis to see if they are effective against COVID-19. Your health care provider will talk with you about ways to treat your symptoms. For most people, the infection is mild and can be managed at home with rest, fluids, and over-the-counter medicines. Treatment for a serious infection usually takes places in a hospital  intensive care unit (ICU). It may include one or more of the following treatments. These treatments are given until your symptoms improve.  Receiving fluids and medicines through an IV.  Supplemental oxygen. Extra oxygen is given through a tube in the nose, a face mask, or a hood.  Positioning you to lie on your stomach (prone position). This makes it easier for oxygen to get into the lungs.  Continuous positive airway pressure (CPAP) or bi-level positive airway pressure (BPAP) machine. This treatment uses mild air pressure to keep the airways open. A tube that is connected to a motor delivers oxygen to the body.  Ventilator. This treatment moves air into and out of the lungs by using a tube that is placed in your windpipe.  Tracheostomy. This is a procedure to create a hole in the neck so that a breathing tube can be inserted.  Extracorporeal membrane oxygenation (ECMO). This procedure gives the lungs a chance to recover by taking over the functions of the heart and lungs. It supplies oxygen to the body and removes carbon dioxide. Follow these instructions at home: Lifestyle  If you are sick, stay home except to get medical care. Your health care provider will tell you how long to stay home. Call your health care provider before you go for medical care.  Rest at home as told by your health care provider.  Do not use any products that contain nicotine or tobacco, such as cigarettes, e-cigarettes, and chewing tobacco. If you need help quitting, ask your health care provider.  Return to your normal activities as told by your health care provider. Ask your health care provider what activities are safe for you. General instructions  Take over-the-counter and prescription medicines only as told by your health care provider.  Drink enough fluid to keep your urine pale yellow.  Keep all follow-up visits as told by your health care provider. This is important. How is this prevented?  There  is no vaccine to help prevent COVID-19 infection. However, there are steps you can take to protect yourself and others from this virus. To protect yourself:   Do not travel to areas where COVID-19 is a risk. The areas where COVID-19 is reported change often. To identify high-risk areas and travel restrictions, check the CDC travel website: [employer]siwwwnc.cdc.gov/travel/notices  If you live in, or must travel to, an area where COVID-19 is a risk, take precautions to avoid infection. ? Stay away from people who are sick. ? Wash your hands often with soap and water for 20 seconds. If soap and water are not available,  use an alcohol-based hand sanitizer. ? Avoid touching your mouth, face, eyes, or nose. ? Avoid going out in public, follow guidance from your state and local health authorities. ? If you must go out in public, wear a cloth face covering or face mask. Make sure your mask covers your nose and mouth. ? Avoid crowded indoor spaces. Stay at least 6 feet (2 meters) away from others. ? Disinfect objects and surfaces that are frequently touched every day. This may include:  Counters and tables.  Doorknobs and light switches.  Sinks and faucets.  Electronics, such as phones, remote controls, keyboards, computers, and tablets. To protect others: If you have symptoms of COVID-19, take steps to prevent the virus from spreading to others.  If you think you have a COVID-19 infection, contact your health care provider right away. Tell your health care team that you think you may have a COVID-19 infection.  Stay home. Leave your house only to seek medical care. Do not use public transport.  Do not travel while you are sick.  Wash your hands often with soap and water for 20 seconds. If soap and water are not available, use alcohol-based hand sanitizer.  Stay away from other members of your household. Let healthy household members care for children and pets, if possible. If you have to care for  children or pets, wash your hands often and wear a mask. If possible, stay in your own room, separate from others. Use a different bathroom.  Make sure that all people in your household wash their hands well and often.  Cough or sneeze into a tissue or your sleeve or elbow. Do not cough or sneeze into your hand or into the air.  Wear a cloth face covering or face mask. Make sure your mask covers your nose and mouth. Where to find more information  Centers for Disease Control and Prevention: [employer]ickerEm[employer]ium.tn  World Health Organization: https://thompson-craig.com/ Contact a health care provider if:  You live in or have traveled to an area where COVID-19 is a risk and you have symptoms of the infection.  You have had contact with someone who has COVID-19 and you have symptoms of the infection. Get help right away if:  You have trouble breathing.  You have pain or pressure in your chest.  You have confusion.  You have bluish lips and fingernails.  You have difficulty waking from sleep.  You have symptoms that get worse. These symptoms may represent a serious problem that is an emergency. Do not wait to see if the symptoms will go away. Get medical help right away. Call your local emergency services (911 in the [place] Do not drive yourself to the hospital. Let the emergency medical personnel know if you think you have COVID-19. Summary  COVID-19 is a respiratory infection that is caused by a virus. It is also known as coronavirus disease or novel coronavirus. It can cause serious infections, such as pneumonia, acute respiratory distress syndrome, acute respiratory failure, or sepsis.  The virus that causes COVID-19 is contagious. This means that it can spread from person to person through droplets from breathing, speaking, singing, coughing, or sneezing.  You are more likely to develop a serious illness if you are 35 years of age or older, have a  weak immune system, live in a nursing home, or have chronic disease.  There is no medicine to treat COVID-19. Your health care provider will talk with you about ways to treat your symptoms.  Take steps  to protect yourself and others from infection. Wash your hands often and disinfect objects and surfaces that are frequently touched every day. Stay away from people who are sick and wear a mask if you are sick. This information is not intended to replace advice given to you by your health care provider. Make sure you discuss any questions you have with your health care provider. Document Revised: 08/02/2019 Document Reviewed: 11/08/2018 Elsevier Patient Education  2020 [employer] Cough, Adult A cough helps to clear your throat and lungs. A cough may be a sign of an illness or another medical condition. An acute cough may only last 2-3 weeks, while a chronic cough may last 8 or more weeks. Many things can cause a cough. They include:  Germs (viruses or bacteria) that attack the airway.  Breathing in things that bother (irritate) your lungs.  Allergies.  Asthma.  Mucus that runs down the back of your throat (postnasal drip).  Smoking.  Acid backing up from the stomach into the tube that moves food from the mouth to the stomach (gastroesophageal reflux).  Some medicines.  Lung problems.  Other medical conditions, such as heart failure or a blood clot in the lung (pulmonary embolism). Follow these instructions at home: Medicines  Take over-the-counter and prescription medicines only as told by your doctor.  Talk with your doctor before you take medicines that stop a cough (coughsuppressants). Lifestyle   Do not smoke, and try not to be around smoke. Do not use any products that contain nicotine or tobacco, such as cigarettes, e-cigarettes, and chewing tobacco. If you need help quitting, ask your doctor.  Drink enough fluid to keep your pee (urine) pale yellow.  Avoid  caffeine.  Do not drink alcohol if your doctor tells you not to drink. General instructions   Watch for any changes in your cough. Tell your doctor about them.  Always cover your mouth when you cough.  Stay away from things that make you cough, such as perfume, candles, campfire smoke, or cleaning products.  If the air is dry, use a cool mist vaporizer or humidifier in your home.  If your cough is worse at night, try using extra pillows to raise your head up higher while you sleep.  Rest as needed.  Keep all follow-up visits as told by your doctor. This is important. Contact a doctor if:  You have new symptoms.  You cough up pus.  Your cough does not get better after 2-3 weeks, or your cough gets worse.  Cough medicine does not help your cough and you are not sleeping well.  You have pain that gets worse or pain that is not helped with medicine.  You have a fever.  You are losing weight and you do not know why.  You have night sweats. Get help right away if:  You cough up blood.  You have trouble breathing.  Your heartbeat is very fast. These symptoms may be an emergency. Do not wait to see if the symptoms will go away. Get medical help right away. Call your local emergency services (911 in the [place] Do not drive yourself to the hospital. Summary  A cough helps to clear your throat and lungs. Many things can cause a cough.  Take over-the-counter and prescription medicines only as told by your doctor.  Always cover your mouth when you cough.  Contact a doctor if you have new symptoms or you have a cough that does not get better or gets worse.  This information is not intended to replace advice given to you by your health care provider. Make sure you discuss any questions you have with your health care provider. Document Revised: 10/22/2018 Document Reviewed: 10/22/2018 Elsevier Patient Education  [place].

## 2020-05-21 NOTE — Progress Notes (Signed)
I,Cheryl Morgan,acting as a scribe for [employer] MutualMichelle Smith Kenitha Glendinning, FNP.,have documented all relevant documentation on the behalf of Jairo BenMichelle Smith Demetre Monaco, FNP,as directed by  Jairo BenMichelle Smith Adonus Uselman, FNP while in the presence of Jairo BenMichelle Smith Chasady Longwell, FNP.   MyChart Telephone  Visit    Virtual Visit BY Telephone    This visit type was conducted due to national recommendations for restrictions regarding the COVID-19 Pandemic (e.g. social distancing) in an effort to limit this patient's exposure and mitigate transmission in our community. This patient is at least at moderate risk for complications without adequate follow up. This format is felt to be most appropriate for this patient at this time. Physical exam was limited by quality of the video and audio technology used for the visit.   Patient location: at home  Provider location: Provider: Provider's office at  [facility], [place]      Patient: Cheryl Morgan   DOB: 03/04/1996   23 y.o. Female  MRN: [id number] Visit Date: 05/21/2020  Today's healthcare provider: Jairo BenMichelle Smith Royal Beirne, FNP   No chief complaint on file.  Subjective    HPI  Patient is a 24 year old female who was diagnosed on 05/15/20 wth positive Covid 19 test,  and who started with symptoms 05/07/20.  She is having mild shortness of breath, coughing productive cough,  at night. Mild wheezing occasionally.  Upper chest congestion.chest tightness bronchial.  She is taking over the counter cough medication- Nyquil as needed. .  She denies chest pain.  Fatigue mild to moderate , lack of appetite. Loss of taste and smell.  Is still able to walk up the stairs just takes time and walks in house/  Denies any fever. She is taking Tylenol PRN. Mild nausea, no other symptoms.   She is a smoker.  She denies any distress. Her roommate also has Covid.  She did have history of left perihilar pneumonia in Cheryl 2021. History of asthma.   Patient   denies any fever, body aches,chills, rash,,vomiting, or diarrhea.  Denies dizziness, lightheadedness, pre syncopal or syncopal episodes.   Patient Active Problem List   Diagnosis Date Noted  . COVID-19 05/21/2020  . Chest tightness 05/21/2020  . Cough 05/21/2020  . History of asthma 05/21/2020  . History of pneumonia 05/21/2020  . Fatigue 05/21/2020  . Wheezing 05/21/2020  . Dyspepsia   . Rectal bleeding   . 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 Dysmenorrhea 08/01/2012  . Excessive and frequent menstruation 08/01/2012   Past Medical History:  Diagnosis Date  . GERD (gastroesophageal reflux disease)   . Motion sickness    circular motion  . Seasonal allergies   . Weight gain 08/01/2012   Past Surgical History:  Procedure Laterality Date  . ESOPHAGOGASTRODUODENOSCOPY (EGD) WITH PROPOFOL N/A 01/02/2019   Procedure: ESOPHAGOGASTRODUODENOSCOPY (EGD) WITH BIOPSIES;  Surgeon: Toney ReilVanga, Rohini Reddy, MD;  [facility];  Service: Endoscopy;  Laterality: N/A;  . FLEXIBLE SIGMOIDOSCOPY N/A 01/02/2019   Procedure: FLEXIBLE SIGMOIDOSCOPY;  Surgeon: Toney ReilVanga, Rohini Reddy, MD;  [facility];  Service: Endoscopy;  Laterality: N/A;  . MOUTH SURGERY     tooth extraction  . Skyla     Inserted 08-17-12   Social History   Tobacco Use  . Smoking status: Current Every Day Smoker    Types: E-cigarettes  . Smokeless tobacco: Never Used  . Tobacco comment: Smoked "regular" cigarettes for approx 3 months in 2019  Vaping Use  .  Vaping Use: Every day  . Start date: 12/15/2016  . Substances: Nicotine, Flavoring  . Devices: juul  Substance Use Topics  . Alcohol use: Yes    Comment: occassionally - 1x/mo  . Drug use: No   Social History   Socioeconomic History  . Marital status: Single    Spouse name: Not on file  . Number of children: Not on file  . Years of education: Not on file  . Highest education level: Not on  file  Occupational History  . Not on file  Tobacco Use  . Smoking status: Current Every Day Smoker    Types: E-cigarettes  . Smokeless tobacco: Never Used  . Tobacco comment: Smoked "regular" cigarettes for approx 3 months in 2019  Vaping Use  . Vaping Use: Every day  . Start date: 12/15/2016  . Substances: Nicotine, Flavoring  . Devices: juul  Substance and Sexual Activity  . Alcohol use: Yes    Comment: occassionally - 1x/mo  . Drug use: No  . Sexual activity: Yes    Birth control/protection: I.U.D.    Comment: Skyla inserted 08-17-12  Other Topics Concern  . Not on file  Social History Narrative  . Not on file   Social Determinants of Health   Financial Resource Strain:   . Difficulty of Paying Living Expenses:   Food Insecurity:   . Worried About Programme researcher, broadcasting/film/video in the Last Year:   . Barista in the Last Year:   Transportation Needs:   . Freight forwarder (Medical):   Marland Kitchen Lack of Transportation (Non-Medical):   Physical Activity:   . Days of Exercise per Week:   . Minutes of Exercise per Session:   Stress:   . Feeling of Stress :   Social Connections:   . Frequency of Communication with Friends and Family:   . Frequency of Social Gatherings with Friends and Family:   . Attends Religious Services:   . Active Member of Clubs or Organizations:   . Attends Banker Meetings:   Marland Kitchen Marital Status:   Intimate Partner Violence:   . Fear of Current or Ex-Partner:   . Emotionally Abused:   Marland Kitchen Physically Abused:   . Sexually Abused:    Family Status  Relation Name Status  . Mother  Alive  . Father  Alive  . Mat Aunt  (Not Specified)  . MGM  (Not Specified)   Family History  Problem Relation Age of Onset  . Hypertension Maternal Aunt   . Diabetes Maternal Grandmother   . Hypertension Maternal Grandmother   . Depression Maternal Grandmother    No Known Allergies    Medications: Outpatient Medications Prior to Visit  Medication Sig    . ethynodiol-ethinyl estradiol [place] 1/35) 1-35 MG-MCG tablet Take 1 tablet by mouth daily. (Patient not taking: Reported on 01/08/2020)  . fluconazole (DIFLUCAN) 150 MG tablet TAKE 1 TABLET (150 MG TOTAL) BY MOUTH ONCE FOR 1 DOSE. MAY REPEAT IN 3 DAYS IF SYMPTOMS NOT RESOLVED  . sertraline (ZOLOFT) 50 MG tablet TAKE 1 TABLET BY MOUTH EVERY DAY (Patient not taking: Reported on 01/08/2020)  . [DISCONTINUED] azithromycin (ZITHROMAX) 250 MG tablet Take 2 tablets PO on day one, and one tablet PO daily thereafter until completed. (Patient not taking: Reported on 05/21/2020)   No facility-administered medications prior to visit.    Review of Systems  Constitutional: Positive for appetite change and fatigue. Negative for activity change, chills, diaphoresis, fever and unexpected weight  change.  HENT: Positive for congestion, ear pain, postnasal drip and rhinorrhea. Negative for nosebleeds, sinus pain, sneezing, sore throat, trouble swallowing and voice change.   Respiratory: Positive for cough, chest tightness, shortness of breath and wheezing. Negative for apnea, choking and stridor.   Cardiovascular: Negative for chest pain, palpitations and leg swelling.  Gastrointestinal: Positive for nausea. Negative for abdominal distention, abdominal pain, anal bleeding, blood in stool, constipation, diarrhea, rectal pain and vomiting.  Genitourinary: Negative.   Musculoskeletal: Negative.   Neurological: Negative.   Hematological: Negative.   Psychiatric/Behavioral: Negative.     Last CBC Lab Results  Component Value Date   WBC 13.0 (H) 11/15/2018   HGB 14.7 11/15/2018   HCT 41.8 11/15/2018   MCV 95 11/15/2018   MCH 33.3 (H) 11/15/2018   RDW 11.5 (L) 11/15/2018   PLT 310 11/15/2018   Last metabolic panel Lab Results  Component Value Date   GLUCOSE 90 11/15/2018   NA 137 11/15/2018   K 4.0 11/15/2018   CL 98 11/15/2018   CO2 23 11/15/2018   BUN 8 11/15/2018   CREATININE 0.69 11/15/2018    GFRNONAA 123 11/15/2018   GFRAA 142 11/15/2018   CALCIUM 9.5 11/15/2018   PROT 7.2 11/15/2018   ALBUMIN 4.7 11/15/2018   LABGLOB 2.5 11/15/2018   AGRATIO 1.9 11/15/2018   BILITOT 0.7 11/15/2018   ALKPHOS 78 11/15/2018   AST 14 11/15/2018   ALT 7 11/15/2018   ANIONGAP 8 12/10/2011   Last lipids Lab Results  Component Value Date   CHOL 163 07/30/2015   HDL 53 07/30/2015   LDLCALC 93 07/30/2015   TRIG 86 07/30/2015   CHOLHDL 3.1 07/30/2015   Last hemoglobin A1c No results found for: HGBA1C Last thyroid functions Lab Results  Component Value Date   TSH 2.190 07/30/2015   Last vitamin D No results found for: 25OHVITD2, 25OHVITD3, VD25OH    Objective    There were no vitals taken for this visit. BP Readings from Last 3 Encounters:  01/08/20 95/67  11/12/19 117/63  08/16/19 (!) 82/49    No vitals available. No pulse oximetry meter.  PHONE VISIT ONLY AS PATIENT COULD NOT CONNECT DUE TO INTERNET ISSUES.  Physical Exam    Patient is alert and oriented and responsive to questions Engages in conversation with provider. Speaks in full sentences without any pauses without any shortness of breath or distress.    Assessment & Plan     The primary encounter diagnosis was COVID-19. Diagnoses of Cough, Chest tightness, History of asthma, Fatigue, unspecified type, History of pneumonia, and Wheezing were also pertinent to this visit.   Discussed viral etiology of covid, however given patients history will cover for bacterial pneumonia.   Symptom management discussed. Rest and hydration. Obtaining a pulse oximetry and monitoring for readings above 95 %.  Meds ordered this encounter  Medications  . azithromycin (ZITHROMAX) 250 MG tablet    Sig: Take 2 tablets PO on day one, and one tablet PO daily thereafter until completed.    Dispense:  6 tablet    Refill:  0  . predniSONE (STERAPRED UNI-PAK 21 TAB) 10 MG (21) TBPK tablet    Sig: PO: Take 6 tablets on day 1:Take 5 tablets  day 2:Take 4 tablets day 3: Take 3 tablets day 4:Take 2 tablets day five: 5 Take 1 tablet day 6    Dispense:  21 tablet    Refill:  0  . albuterol (PROVENTIL) (2.5 MG/3ML) 0.083% nebulizer solution  Sig: Take 3 mLs (2.5 mg total) by nebulization every 6 (six) hours as needed for wheezing or shortness of breath.    Dispense:  150 mL    Refill:  0  . benzonatate (TESSALON) 100 MG capsule    Sig: Take 1 capsule (100 mg total) by mouth 3 (three) times daily as needed for cough.    Dispense:  20 capsule    Refill:  0    Red Flags discussed. The patient was given clear instructions to go to ER or return to medical center if any red flags develop, symptoms do not improve, worsen or new problems develop. They verbalized understanding.  Return if symptoms worsen or fail to improve, for at any time for any worsening symptoms, Go to Emergency room/ urgent care if worse.    I discussed the limitations of evaluation and management by telemedicine and the availability of in person appointments. The patient expressed understanding and agreed to proceed. I discussed the assessment and treatment plan with the patient. The patient was provided an opportunity to ask questions and all were answered. The patient agreed with the plan and demonstrated an understanding of the instructions.   The patient was advised to call back or seek an in-person evaluation if the symptoms worsen or if the condition fails to improve as anticipated.  I provided 23 minutes of non-face-to-face time during this encounter.  IBeverely Pace Albaraa Swingle, FNP, have reviewed all documentation for this visit. The documentation on 05/21/20 for the exam, diagnosis, procedures, and orders are all accurate and complete.    Jairo Ben, FNP [facility] [phone] (phone) [phone] (fax)  [facility]

## 2020-06-30 NOTE — Progress Notes (Signed)
Established patient visit   Patient: Cheryl Morgan   DOB: June 19, 1996   23 y.o. Female  MRN: [id number] Visit Date: 07/01/2020  Today's healthcare provider: Margaretann Loveless, PA-C   Chief Complaint  Patient presents with   Acne   Subjective    HPI  Acne: Patient presents for evaluation of acne.  Onset was several months ago. Symptoms have gradually worsened. Acne is primarily located on the cheeks. The patient also describes severity of lesions varies with menstrual cycle and has mood/stress problems due to acne. Treatment to date has included skin hygiene measures: temporarily effective, benzoyl peroxide preparations: not very effective and RetinA: not very effective she has also Tretinoin with azelaic. Want referral to Dermatology.  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   azithromycin (ZITHROMAX) 250 MG tablet Take 2 tablets PO on day one, and one tablet PO daily thereafter until completed.   benzonatate (TESSALON) 100 MG capsule Take 1 capsule (100 mg total) by mouth 3 (three) times daily as needed for cough.   ethynodiol-ethinyl estradiol [place] 1/35)  1-35 MG-MCG tablet Take 1 tablet by mouth daily. (Patient not taking: Reported on 01/08/2020)   fluconazole (DIFLUCAN) 150 MG tablet TAKE 1 TABLET (150 MG TOTAL) BY MOUTH ONCE FOR 1 DOSE. MAY REPEAT IN 3 DAYS IF SYMPTOMS NOT RESOLVED   predniSONE (STERAPRED UNI-PAK 21 TAB) 10 MG (21) TBPK tablet PO: Take 6 tablets on day 1:Take 5 tablets day 2:Take 4 tablets day 3: Take 3 tablets day 4:Take 2 tablets day five: 5 Take 1 tablet day 6   sertraline (ZOLOFT) 50 MG tablet TAKE 1 TABLET BY MOUTH EVERY DAY (Patient not taking: Reported on 01/08/2020)   No facility-administered medications prior to visit.    Review of Systems  Constitutional: Positive for fatigue.  HENT: Negative.   Eyes: Negative.   Respiratory: Negative.   Cardiovascular: Negative.   Gastrointestinal: Negative.   Endocrine: Negative.   Genitourinary: Negative.   Musculoskeletal: Negative.   Skin:       Acne  Allergic/Immunologic: Negative.   Neurological: Negative.   Hematological: Negative.   Psychiatric/Behavioral: Positive for sleep disturbance.    Last CBC Lab Results  Component Value Date   WBC 13.0 (H) 11/15/2018   HGB 14.7 11/15/2018   HCT 41.8 11/15/2018   MCV 95 11/15/2018   MCH 33.3 (H) 11/15/2018   RDW 11.5 (L) 11/15/2018   PLT 310 11/15/2018   Last metabolic panel Lab Results  Component Value Date   GLUCOSE 90 11/15/2018   NA 137 11/15/2018   K 4.0 11/15/2018   CL 98 11/15/2018   CO2 23 11/15/2018  BUN 8 11/15/2018   CREATININE 0.69 11/15/2018   GFRNONAA 123 11/15/2018   GFRAA 142 11/15/2018   CALCIUM 9.5 11/15/2018   PROT 7.2 11/15/2018   ALBUMIN 4.7 11/15/2018   LABGLOB 2.5 11/15/2018   AGRATIO 1.9 11/15/2018   BILITOT 0.7 11/15/2018   ALKPHOS 78 11/15/2018   AST 14 11/15/2018   ALT 7 11/15/2018   ANIONGAP 8 12/10/2011      Objective    BP 105/70 (BP Location: Left Arm, Patient Position: Sitting, Cuff Size: Normal)    Pulse 65    Temp 98.1 F (36.7 C) (Oral)    Resp 16    Wt  178 lb 9.6 oz (81 kg)    BMI 27.97 kg/m  BP Readings from Last 3 Encounters:  07/01/20 105/70  01/08/20 95/67  11/12/19 117/63   Wt Readings from Last 3 Encounters:  07/01/20 178 lb 9.6 oz (81 kg)  01/08/20 166 lb (75.3 kg)  11/12/19 166 lb 9.6 oz (75.6 kg)      Physical Exam Vitals reviewed.  Constitutional:      General: She is not in acute distress.    Appearance: Normal appearance. She is well-developed, well-groomed and overweight. She is not ill-appearing.  HENT:     Head: Normocephalic and atraumatic.  Pulmonary:     Effort: Pulmonary effort is normal. No respiratory distress.  Musculoskeletal:     Cervical back: Normal range of motion and neck supple.  Skin:    General: Skin is warm and dry.     Findings: Acne (fine papular acne vulgaris all over face, but most noticeable (erythematous) on the cheeks (L>R)) present.  Neurological:     Mental Status: She is alert.  Psychiatric:        Behavior: Behavior normal. Behavior is cooperative.        Thought Content: Thought content normal.        Judgment: Judgment normal.       No results found for any visits on 07/01/20.  Assessment & Plan     1. Acne vulgaris Tried multiple agents without success. Will try Benzaclin as below. Moisturize. Referral to dermatology placed as below for further evaluation and considerations for Isotretinoin treatment. Call if not tolerating medication.  - clindamycin-benzoyl peroxide (BENZACLIN) gel; Apply topically 2 (two) times daily.  Dispense: 50 g; Refill: 0 - Ambulatory referral to Dermatology  2. BMI 27.0-27.9,adult Counseled patient on healthy lifestyle modifications including dieting and exercise.    No follow-ups on file.      Delmer Islam, PA-C, have reviewed all documentation for this visit. The documentation on 07/01/20 for the exam, diagnosis, procedures, and orders are all accurate and complete.   Reine Just  [facility] [phone] (phone) [phone] (fax)  [facility]

## 2020-07-01 ENCOUNTER — Other Ambulatory Visit: Payer: Self-pay

## 2020-07-01 ENCOUNTER — Encounter: Payer: Self-pay | Admitting: Physician Assistant

## 2020-07-01 ENCOUNTER — Ambulatory Visit (INDEPENDENT_AMBULATORY_CARE_PROVIDER_SITE_OTHER): Payer: BC Managed Care – PPO | Admitting: Physician Assistant

## 2020-07-01 VITALS — BP 105/70 | HR 65 | Temp 98.1°F | Resp 16 | Wt 178.6 lb

## 2020-07-01 DIAGNOSIS — Z6827 Body mass index (BMI) 27.0-27.9, adult: Secondary | ICD-10-CM | POA: Diagnosis not present

## 2020-07-01 DIAGNOSIS — L700 Acne vulgaris: Secondary | ICD-10-CM | POA: Diagnosis not present

## 2020-07-01 MED ORDER — CLINDAMYCIN PHOS-BENZOYL PEROX 1-5 % EX GEL: Freq: Two times a day (BID) | CUTANEOUS | 0 refills | Status: DC

## 2020-07-01 NOTE — Patient Instructions (Signed)
Acne  Acne is a skin problem that causes pimples and other skin changes. The skin has many tiny openings called pores. Each pore contains an oil gland. Oil glands make an oily substance that is called sebum. Acne occurs when the pores in the skin get blocked. The pores may become infected with bacteria, or they may become red, sore, and swollen. Acne is a common skin problem, especially for teenagers. It often occurs on the face, neck, chest, upper arms, and back. Acne usually goes away over time. What are the causes? Acne is caused when oil glands get blocked with sebum, dead skin cells, and dirt. The bacteria that are normally found in the oil glands then multiply and cause inflammation. Acne is commonly triggered by changes in your hormones. These hormonal changes can cause the oil glands to get bigger and to make more sebum. Factors that can make acne worse include:  Hormone changes during: ? Adolescence. ? Women's menstrual cycles. ? Pregnancy.  Oil-based cosmetics and hair products.  Stress.  Hormone problems that are caused by certain diseases.  Certain medicines.  Pressure from headbands, backpacks, or shoulder pads.  Exposure to certain oils and chemicals.  Eating a diet high in carbohydrates that quickly turn to sugar. These include dairy products, desserts, and chocolates. What increases the risk? This condition is more likely to develop in:  Teenagers.  People who have a family history of acne. What are the signs or symptoms? Symptoms include:  Small, red bumps (pimples or papules).  Whiteheads.  Blackheads.  Small, pus-filled pimples (pustules).  Big, red pimples or pustules that feel tender. More severe acne can cause:  An abscess. This is an infected area that contains a collection of pus.  Cysts. These are hard, painful, fluid-filled sacs.  Scars. These can happen after large pimples heal. How is this diagnosed? This condition is diagnosed with a  medical history and physical exam. Blood tests may also be done. How is this treated? Treatment for this condition can vary depending on the severity of your acne. Treatment may include:  Creams and lotions that prevent oil glands from clogging.  Creams and lotions that treat or prevent infections and inflammation.  Antibiotic medicines that are applied to the skin or taken as a pill.  Pills that decrease sebum production.  Birth control pills.  Light or laser treatments.  Injections of medicine into the affected areas.  Chemicals that cause peeling of the skin.  Surgery. Your health care provider will also recommend the best way to take care of your skin. Good skin care is the most important part of treatment. Follow these instructions at home: Skin care Take care of your skin as told by your health care provider. You may be told to do these things:  Wash your skin gently at least two times each day, as well as: ? After you exercise. ? Before you go to bed.  Use mild soap.  Apply a water-based skin moisturizer after you wash your skin.  Use a sunscreen or sunblock with SPF 30 or greater. This is especially important if you are using acne medicines.  Choose cosmetics that will not block your oil glands (are noncomedogenic). Medicines  Take over-the-counter and prescription medicines only as told by your health care provider.  If you were prescribed an antibiotic medicine, apply it or take it as told by your health care provider. Do not stop using the antibiotic even if your condition improves. General instructions  Keep your   hair clean and off your face. If you have oily hair, shampoo your hair regularly or daily.  Avoid wearing tight headbands or hats.  Avoid picking or squeezing your pimples. That can make your acne worse and cause scarring.  Shave gently and only when necessary.  Keep a food journal to figure out if any foods are linked to your acne. Avoid dairy  products, desserts, and chocolates.  Take steps to manage and reduce stress.  Keep all follow-up visits as told by your health care provider. This is important. Contact a health care provider if:  Your acne is not better after eight weeks.  Your acne gets worse.  You have a large area of skin that is red or tender.  You think that you are having side effects from any acne medicine. Summary  Acne is a skin problem that causes pimples and other skin changes. Acne is a common skin problem, especially for teenagers. Acne usually goes away over time.  Acne is commonly triggered by changes in your hormones. There are many other causes, such as stress, diet, and certain medicines.  Follow your health care provider's instructions for how to take care of your skin. Good skin care is the most important part of treatment.  Take over-the-counter and prescription medicines only as told by your health care provider.  Contact your health care provider if you think that you are having side effects from any acne medicine. This information is not intended to replace advice given to you by your health care provider. Make sure you discuss any questions you have with your health care provider. Document Revised: 02/13/2018 Document Reviewed: 02/13/2018 Elsevier Patient Education  2020 Else[employer].  Benzoyl Peroxide; Clindamycin skin gel or lotion What is this medicine? BENZOYL PEROXIDE;CLINDAMYCIN GEL (BEN zoe ill per OX ide; klin da MYE sin) is used on the skin to treat mild to moderate acne. This medicine may be used for other purposes; ask your health care provider or pharmacist if you have questions. COMMON BRAND NAME(S): Acanya, BenzaClin, Duac, Duac CS, Neuac, ONEXTON What should I tell my health care provider before I take this medicine? They need to know if you have any of these conditions:  chronic skin problem like eczema  skin abrasions  stomach or colon problems  sunburn  an  unusual or allergic reaction to benzoyl peroxide, clindamycin, other medicines, foods, dyes, or preservatives  pregnant or trying to get pregnant  breast-feeding How should I use this medicine? This medicine is for external use only. Follow the directions on the prescription label. Before applying, wash the affected area with a gentle cleanser and pat dry. Apply enough medicine to cover the area and rub in gently. Do not apply to raw or irritated skin. Avoid getting medicine in your eyes, lips, nose, mouth, or other sensitive areas. Finish the full course prescribed by your doctor or health care professional even if you think your condition is better. Do not stop using except on the advice of your doctor or health care professional. Talk to your pediatrician regarding the use of this medicine in children. While this drug may be prescribed for children as young as 59 years of age for selected conditions, precautions do apply. Overdosage: If you think you have taken too much of this medicine contact a poison control center or emergency room at once. NOTE: This medicine is only for you. Do not share this medicine with others. What if I miss a dose? If you miss a  dose, use it as soon as you can. If it almost time for your next dose, use only that dose. Do not use double or extra doses. What may interact with this medicine? Do not use any other acne product on the affected areas unless your health care professional tells you to. Check with your health care professional about the use of this product if you are taking an oral medicine for acne. This medicine may also interact with the following medications:  erythromycin  neuromuscular blocking agents  topical sulfone products This list may not describe all possible interactions. Give your health care provider a list of all the medicines, herbs, non-prescription drugs, or dietary supplements you use. Also tell them if you smoke, drink alcohol, or use  illegal drugs. Some items may interact with your medicine. What should I watch for while using this medicine? Your acne may get worse during the first few weeks of treatment with this medicine, and then start to improve. It may take 8 to 12 weeks before you see the full effect. If you do not see any improvement within 4 to 6 weeks, call your doctor or health care professional. This medicine has caused severe allergic reactions in some patients. Stop using this medicine and contact your doctor or health care professional right away if you experience severe swelling or shortness of breath after using this medicine. Do not wash areas of skin treated with this medicine for at least 1 hour after applying the medicine. If you experience excessive dry and peeling skin or skin irritation, check with your doctor or health care professional. Do not use products that may dry the skin like medicated cosmetics, products that contain alcohol, or abrasive soaps or cleaners. Do not use other acne or skin treatment on the same area that you use this medicine unless your doctor or health care professional tells you to. If you use these together they can cause severe skin irritation. This medicine can make you more sensitive to the sun. Keep out of the sun. If you cannot avoid being in the sun, wear protective clothing and use sunscreen. Do not use sun lamps or tanning beds/booths. This medicine may bleach hair or colored fabrics. Avoid getting this product on your clothes. What side effects may I notice from receiving this medicine? Side effects that you should report to your doctor or health care professional as soon as possible:  allergic reactions like skin rash, itching or hives, swelling of the face, lips, or tongue  breathing problems  severe skin burning and irritation  severe skin dryness and peeling Side effects that usually do not require medical attention (report to your doctor or health care professional  if they continue or are bothersome):  increased sensitivity to the sun  mild to moderate skin burning or stinging  mild to moderate skin irritation, dryness or peeling This list may not describe all possible side effects. Call your doctor for medical advice about side effects. You may report side effects to FDA at 1-800-FDA-1088. Where should I keep my medicine? Keep out of the reach of children. Duac Gel, Acanya Gel, and Onexton Gel can be stored at room temperature up to 25 degrees C (77 degrees F) for up to 2 months. Do not freeze. Discard any unused product after 2 months. The expiration date will be applied to the medicine container by your pharmacist. BenzaClin Gel can be stored at room temperature up to 25 degrees C (77 degrees F) for up to 3  months. Do not freeze. Discard any unused product after 3 months. The expiration date will be applied to the medicine container by your pharmacist. NOTE: This sheet is a summary. It may not cover all possible information. If you have questions about this medicine, talk to your doctor, pharmacist, or health care provider.  2020 Elsevier/Gold Standard (2014-01-31 15:17:12)

## 2020-07-27 ENCOUNTER — Ambulatory Visit: Payer: Self-pay | Admitting: Physician Assistant

## 2020-07-27 ENCOUNTER — Encounter: Payer: Self-pay | Admitting: Physician Assistant

## 2020-07-27 NOTE — Telephone Encounter (Signed)
Pt reports dysuria yesterday, not presently. Reports bladder "Pressure, like a heaviness" today. Pt  made appt for Wednesday "I thought maybe I had a yeast infection but now not sure." Reports small blood clots " I think I am starting my period but it may be in my urine." Reports subjective fever. Denies vaginal itching, states "Smell is off, not sure if from urine."  States "The discomfort has been all day." Took ASA which "Helped some."  Also states "I feel like I have to empty my bladder but can't." Pt has appt Wednesday, offered to look for appt in AM,advised she can go to UC; states she will go to UC.   Reason for Disposition . Bad or foul-smelling urine  Answer Assessment - Initial Assessment Questions 1. SYMPTOM: "What's the main symptom you're concerned about?" (e.g., frequency, incontinence)     Bladder pressure 2. ONSET: "When did the  *No Answer*  start?"     today 3. PAIN: "Is there any pain?" If Yes, ask: "How bad is it?" (Scale: 1-10; mild, moderate, severe)     5/10 4. CAUSE: "What do you think is causing the symptoms?"     Unsure if vaginal or UTI 5. OTHER SYMPTOMS: "Do you have any other symptoms?" (e.g., fever, flank pain, blood in urine, pain with urination)     "Feels warm"  Burning with urine 2 days ago, not presently. 6. PREGNANCY: "Is there any chance you are pregnant?" "When was your last menstrual period?"     presently  Protocols used: URINARY [facility]

## 2020-07-29 ENCOUNTER — Ambulatory Visit: Payer: BC Managed Care – PPO | Admitting: Physician Assistant

## 2020-07-29 NOTE — Telephone Encounter (Signed)
This encounter was created in error - please disregard.

## 2020-08-13 ENCOUNTER — Encounter: Payer: Self-pay | Admitting: Adult Health

## 2020-08-13 ENCOUNTER — Other Ambulatory Visit: Payer: Self-pay

## 2020-08-13 ENCOUNTER — Ambulatory Visit (INDEPENDENT_AMBULATORY_CARE_PROVIDER_SITE_OTHER): Payer: BC Managed Care – PPO | Admitting: Adult Health

## 2020-08-13 VITALS — BP 107/69 | HR 150 | Resp 16 | Ht 67.0 in | Wt 174.0 lb

## 2020-08-13 DIAGNOSIS — N760 Acute vaginitis: Secondary | ICD-10-CM | POA: Diagnosis not present

## 2020-08-13 DIAGNOSIS — B9689 Other specified bacterial agents as the cause of diseases classified elsewhere: Secondary | ICD-10-CM | POA: Insufficient documentation

## 2020-08-13 DIAGNOSIS — R82998 Other abnormal findings in urine: Secondary | ICD-10-CM | POA: Diagnosis not present

## 2020-08-13 LAB — POCT URINALYSIS DIPSTICK (MANUAL)
Nitrite, UA: NEGATIVE
Poct Bilirubin: NEGATIVE
Poct Glucose: NORMAL mg/dL
Poct Ketones: NEGATIVE
Poct Protein: NEGATIVE mg/dL
Poct Urobilinogen: NORMAL mg/dL
Spec Grav, UA: 1.025 (ref 1.010–1.025)
pH, UA: 6.5 (ref 5.0–8.0)

## 2020-08-13 MED ORDER — METRONIDAZOLE 0.75 % VA GEL: 1.0000 | Freq: Every day | VAGINAL | 0 refills | Status: AC

## 2020-08-13 MED ORDER — CEPHALEXIN 500 MG PO CAPS: 500.0000 mg | ORAL_CAPSULE | Freq: Three times a day (TID) | ORAL | 0 refills | Status: DC

## 2020-08-13 NOTE — Progress Notes (Signed)
Established patient visit   Patient: Cheryl Morgan   DOB: October 22, 1995   24 y.o. Female  MRN: [id number] Visit Date: 08/13/2020  Today's healthcare provider: Jairo Ben, FNP   No chief complaint on file.  Subjective    Urinary Tract Infection  This is a new problem. The current episode started 1 to 4 weeks ago (2 weeks). The problem has been gradually worsening. The quality of the pain is described as aching. There has been no fever. Associated symptoms include frequency and urgency. Pertinent negatives include no flank pain or hematuria. She has tried antibiotics for the symptoms. The treatment provided mild relief.    Dysuria.  Denies any vaginal discharge or itching. . Denies any STD's concerns Patient reports that she was treated about 1 week ago at the urgent care. She was prescribed Macrobid 100 mg around 07/29/20 and went away and then returned last weekend.  She says that it helped a little, but symptoms came right back.   She has had a history of bacterial vaginitis, has vaginal burning occasionally. No discharge. Has had mild odor. Declines any std testing or need. Denies any skin irritation or itching.    Patient  denies any fever, body aches,chills, rash, chest pain, shortness of breath, nausea, vomiting, or diarrhea.      Medications: Outpatient Medications Prior to Visit  Medication Sig  . clindamycin-benzoyl peroxide (BENZACLIN) gel Apply topically 2 (two) times daily.   No facility-administered medications prior to visit.    Review of Systems  Constitutional: Negative.   HENT: Negative.   Respiratory: Negative.   Cardiovascular: Negative.   Gastrointestinal: Negative.   Genitourinary: Positive for dysuria, frequency and urgency. Negative for decreased urine volume, difficulty urinating, dyspareunia, enuresis, flank pain, genital sores, hematuria, menstrual problem, pelvic pain, vaginal bleeding, vaginal discharge and vaginal pain.    Musculoskeletal: Negative.   Neurological: Negative.   Psychiatric/Behavioral: Negative.       Objective    BP 107/69   Pulse (!) 150   Resp 16   Ht 5\' 7"  (1.702 m)   Wt 174 lb (78.9 kg)   BMI 27.25 kg/m  BP Readings from Last 3 Encounters:  08/13/20 107/69  07/01/20 105/70  01/08/20 95/67      Physical Exam Vitals reviewed.  Constitutional:      General: She is not in acute distress.    Appearance: She is not ill-appearing, toxic-appearing or diaphoretic.  HENT:     Head: Normocephalic and atraumatic.     Right Ear: External ear normal.     Left Ear: External ear normal.  Eyes:     Pupils: Pupils are equal, round, and reactive to light.  Cardiovascular:     Rate and Rhythm: Normal rate and regular rhythm.     Pulses: Normal pulses.     Heart sounds: Normal heart sounds. No murmur heard.  No friction rub. No gallop.   Pulmonary:     Effort: Pulmonary effort is normal. No respiratory distress.     Breath sounds: Normal breath sounds. No stridor. No wheezing, rhonchi or rales.  Chest:     Chest wall: No tenderness.  Abdominal:     General: There is no distension.     Palpations: Abdomen is soft.     Tenderness: There is no abdominal tenderness.  Genitourinary:    Comments: deferred  Musculoskeletal:        General: Normal range of motion.     Cervical back: Normal  range of motion and neck supple.  Skin:    General: Skin is warm.     Capillary Refill: Capillary refill takes less than 2 seconds.     Findings: No erythema or rash.  Neurological:     General: No focal deficit present.     Mental Status: She is alert and oriented to person, place, and time.     Motor: No weakness.     Gait: Gait normal.  Psychiatric:        Mood and Affect: Mood normal.        Behavior: Behavior normal.        Thought Content: Thought content normal.        Judgment: Judgment normal.       Results for orders placed or performed in visit on 08/13/20  POCT Urinalysis  Dip Manual  Result Value Ref Range   Spec Grav, UA 1.025 1.010 - 1.025   pH, UA 6.5 5.0 - 8.0   Leukocytes, UA Moderate (2+) (A) Negative   Nitrite, UA Negative Negative   Poct Protein Negative Negative, trace mg/dL   Poct Glucose Normal Normal mg/dL   Poct Ketones Negative Negative   Poct Urobilinogen Normal Normal mg/dL   Poct Bilirubin Negative Negative   Poct Blood trace Negative, trace    Assessment & Plan     Leukocytes in urine - Plan: CULTURE, URINE COMPREHENSIVE, POCT Urinalysis Dip Manual, cephALEXin (KEFLEX) 500 MG capsule  Bacterial vaginitis - Plan: metroNIDAZOLE (METROGEL VAGINAL) 0.75 % vaginal gel   Meds ordered this encounter  Medications  . cephALEXin (KEFLEX) 500 MG capsule    Sig: Take 1 capsule (500 mg total) by mouth 3 (three) times daily.    Dispense:  21 capsule    Refill:  0  . metroNIDAZOLE (METROGEL VAGINAL) 0.75 % vaginal gel    Sig: Place 1 Applicatorful vaginally at bedtime for 5 days.    Dispense:  50 g    Refill:  0   Orders Placed This Encounter  Procedures  . CULTURE, URINE COMPREHENSIVE  . POCT Urinalysis Dip Manual   return for vaginal swab if not improving. Increase in hydration.  Will call once culture received.   Return if symptoms worsen or fail to improve, for at any time for any worsening symptoms, Go to Emergency room/ urgent care if worse.      Red Flags discussed. The patient was given clear instructions to go to ER or return to medical center if any red flags develop, symptoms do not improve, worsen or new problems develop. They verbalized understanding.    Jairo Ben, FNP  [facility] [phone] (phone) [phone] (fax)  [facility]

## 2020-08-13 NOTE — Patient Instructions (Signed)
Metronidazole vaginal gel What is this medicine? METRONIDAZOLE (me troe NI da zole) VAGINAL GEL is an antiinfective. It is used to treat bacterial vaginitis. This medicine may be used for other purposes; ask your health care provider or pharmacist if you have questions. COMMON BRAND NAME(S): MetroGel, MetroGel Vaginal, MetroGel-Vaginal, NUVESSA, Vandazole What should I tell my health care provider before I take this medicine? They need to know if you have any of these conditions:  Cockayne syndrome  history of blood diseases, like sickle cell disease or leukemia  history of yeast infection  if you often drink alcohol  liver disease  an unusual or allergic reaction to metronidazole, nitroimidazoles, parabens, or other medicines, foods, dyes, or preservatives  pregnant or trying to get pregnant  breast-feeding How should I use this medicine? This medicine is only for use in the vagina. Do not take by mouth or apply to other areas of the body. Follow the directions on the prescription label. Wash hands before and after use. Screw the applicator to the tube and squeeze the tube gently to fill the applicator. Lie on your back, part and bend your knees. Insert the applicator tip high in the vagina and push the plunger to release the gel into the vagina. Gently remove the applicator. Wash the applicator well with warm water and soap. Use at regular intervals. Finish the full course prescribed by your doctor or health care professional even if you think your condition is better. Do not stop using except on the advice of your doctor or health care professional. Talk to your pediatrician regarding the use of this medicine in children. While this drug maybe prescribed for children as young as 12 years for selected conditions, precautions do apply. Overdosage: If you think you have taken too much of this medicine contact a poison control center or emergency room at once. NOTE: This medicine is only  for you. Do not share this medicine with others. What if I miss a dose? If you miss a dose, use it as soon as you can. If it is almost time for your next dose, use only that dose. Do not use double or extra doses. What may interact with this medicine? Do not take this medicine with any of the following medications:  alcohol or any product that contains alcohol  cisapride  disulfiram  dronedarone  pimozide  thioridazine This medicine may also interact with the following medications:  amiodarone  birth control pills  busulfan  carbamazepine  cimetidine  cyclosporine  fluorouracil  lithium  other medicines that prolong the QT interval (cause an abnormal heart rhythm) like dofetilide, ziprasidone  phenobarbital  phenytoin  quinidine  tacrolimus  vecuronium  warfarin This list may not describe all possible interactions. Give your health care provider a list of all the medicines, herbs, non-prescription drugs, or dietary supplements you use. Also tell them if you smoke, drink alcohol, or use illegal drugs. Some items may interact with your medicine. What should I watch for while using this medicine? Tell your doctor or health care professional if your symptoms do not improve or if they get worse. You may get drowsy or dizzy. Do not drive, use machinery, or do anything that needs mental alertness until you know how this medicine affects you. Do not stand or sit up quickly, especially if you are an older patient. This reduces the risk of dizzy or fainting spells. Ask your doctor or health care professional if you should avoid alcohol. Many nonprescription cough and  cold products contain alcohol. Metronidazole can cause an unpleasant reaction when taken with alcohol. The reaction includes flushing, headache, nausea, vomiting, sweating, and increased thirst. The reaction can last from 30 minutes to several hours. If you are being treated for a sexually transmitted disease,  avoid sexual contact until you have finished your treatment. Your sexual partner may also need treatment. What side effects may I notice from receiving this medicine? Side effects that you should report to your doctor or health care professional as soon as possible:  allergic reactions like skin rash, itching or hives, swelling of the face, lips, or tongue  confusion  fast, irregular heartbeat  fever, chills, sore throat  fever with rash, swollen lymph nodes, or swelling of the face  pain, tingling, numbness in the hands or feet  redness, blistering, peeling or loosening of the skin, including inside the mouth  seizures  sign and symptoms of liver injury like dark yellow or brown urine; general ill feeling or flu-like symptoms; light colored stools; loss of appetite; nausea; right upper belly pain; unusually weak or tired; yellowing of the eyes or skin  vaginal discharge, itching, or odor in women Side effects that usually do not require medical attention (report to your doctor or health care professional if they continue or are bothersome):  changes in taste  diarrhea  headache  nausea, vomiting  stomach pain This list may not describe all possible side effects. Call your doctor for medical advice about side effects. You may report side effects to FDA at 1-800-FDA-1088. Where should I keep my medicine? Keep out of the reach of children. Store at room temperature between 15 and 30 degrees C (59 and 86 degrees F). Do not freeze. Throw away any unused medicine after the expiration date. NOTE: This sheet is a summary. It may not cover all possible information. If you have questions about this medicine, talk to your doctor, pharmacist, or health care provider.  2020 Elsevier/Gold Standard (2018-09-25 06:53:27) Cephalexin Tablets or Capsules What is this medicine? CEPHALEXIN (sef a LEX in) is a cephalosporin antibiotic. It treats some infections caused by bacteria. It will not  work for colds, the flu, or other viruses. This medicine may be used for other purposes; ask your health care provider or pharmacist if you have questions. COMMON BRAND NAME(S): Biocef, Daxbia, Keflex, Keftab What should I tell my health care provider before I take this medicine? They need to know if you have any of these conditions:  kidney disease  stomach or intestine problems, especially colitis  an unusual or allergic reaction to cephalexin, other cephalosporins, penicillins, other antibiotics, medicines, foods, dyes or preservatives  pregnant or trying to get pregnant  breast-feeding How should I use this medicine? Take this drug by mouth. Take it as directed on the prescription label at the same time every day. You can take it with or without food. If it upsets your stomach, take it with food. Take all of this drug unless your health care provider tells you to stop it early. Keep taking it even if you think you are better. Talk to your health care provider about the use of this drug in children. While it may be prescribed for selected conditions, precautions do apply. Overdosage: If you think you have taken too much of this medicine contact a poison control center or emergency room at once. NOTE: This medicine is only for you. Do not share this medicine with others. What if I miss a dose? If you miss  a dose, take it as soon as you can. If it is almost time for your next dose, take only that dose. Do not take double or extra doses. What may interact with this medicine?  probenecid  some other antibiotics This list may not describe all possible interactions. Give your health care provider a list of all the medicines, herbs, non-prescription drugs, or dietary supplements you use. Also tell them if you smoke, drink alcohol, or use illegal drugs. Some items may interact with your medicine. What should I watch for while using this medicine? Tell your doctor or health care provider if  your symptoms do not begin to improve in a few days. This medicine may cause serious skin reactions. They can happen weeks to months after starting the medicine. Contact your health care provider right away if you notice fevers or flu-like symptoms with a rash. The rash may be red or purple and then turn into blisters or peeling of the skin. Or, you might notice a red rash with swelling of the face, lips or lymph nodes in your neck or under your arms. Do not treat diarrhea with over the counter products. Contact your doctor if you have diarrhea that lasts more than 2 days or if it is severe and watery. If you have diabetes, you may get a false-positive result for sugar in your urine. Check with your doctor or health care provider. What side effects may I notice from receiving this medicine? Side effects that you should report to your doctor or health care professional as soon as possible:  allergic reactions like skin rash, itching or hives, swelling of the face, lips, or tongue  breathing problems  pain or trouble passing urine  redness, blistering, peeling or loosening of the skin, including inside the mouth  severe or watery diarrhea  unusually weak or tired  yellowing of the eyes, skin Side effects that usually do not require medical attention (report to your doctor or health care professional if they continue or are bothersome):  gas or heartburn  genital or anal irritation  headache  joint or muscle pain  nausea, vomiting This list may not describe all possible side effects. Call your doctor for medical advice about side effects. You may report side effects to FDA at 1-800-FDA-1088. Where should I keep my medicine? Keep out of the reach of children and pets. Store at room temperature between 20 and 25 degrees C (68 and 77 degrees F). Throw away any unused drug after the expiration date. NOTE: This sheet is a summary. It may not cover all possible information. If you have  questions about this medicine, talk to your doctor, pharmacist, or health care provider.  2020 Elsevier/Gold Standard (2019-05-10 11:27:00) Urinary Tract Infection, Adult A urinary tract infection (UTI) is an infection of any part of the urinary tract. The urinary tract includes:  The kidneys.  The ureters.  The bladder.  The urethra. These organs make, store, and get rid of pee (urine) in the body. What are the causes? This is caused by germs (bacteria) in your genital area. These germs grow and cause swelling (inflammation) of your urinary tract. What increases the risk? You are more likely to develop this condition if:  You have a small, thin tube (catheter) to drain pee.  You cannot control when you pee or poop (incontinence).  You are female, and: ? You use these methods to prevent pregnancy:  A medicine that kills sperm (spermicide).  A device that blocks sperm (  diaphragm). ? You have low levels of a female hormone (estrogen). ? You are pregnant.  You have genes that add to your risk.  You are sexually active.  You take antibiotic medicines.  You have trouble peeing because of: ? A prostate that is bigger than normal, if you are female. ? A blockage in the part of your body that drains pee from the bladder (urethra). ? A kidney stone. ? A nerve condition that affects your bladder (neurogenic bladder). ? Not getting enough to drink. ? Not peeing often enough.  You have other conditions, such as: ? Diabetes. ? A weak disease-fighting system (immune system). ? Sickle cell disease. ? Gout. ? Injury of the spine. What are the signs or symptoms? Symptoms of this condition include:  Needing to pee right away (urgently).  Peeing often.  Peeing small amounts often.  Pain or burning when peeing.  Blood in the pee.  Pee that smells bad or not like normal.  Trouble peeing.  Pee that is cloudy.  Fluid coming from the vagina, if you are female.  Pain in  the belly or lower back. Other symptoms include:  Throwing up (vomiting).  No urge to eat.  Feeling mixed up (confused).  Being tired and grouchy (irritable).  A fever.  Watery poop (diarrhea). How is this treated? This condition may be treated with:  Antibiotic medicine.  Other medicines.  Drinking enough water. Follow these instructions at home:  Medicines  Take over-the-counter and prescription medicines only as told by your doctor.  If you were prescribed an antibiotic medicine, take it as told by your doctor. Do not stop taking it even if you start to feel better. General instructions  Make sure you: ? Pee until your bladder is empty. ? Do not hold pee for a long time. ? Empty your bladder after sex. ? Wipe from front to back after pooping if you are a female. Use each tissue one time when you wipe.  Drink enough fluid to keep your pee pale yellow.  Keep all follow-up visits as told by your doctor. This is important. Contact a doctor if:  You do not get better after 1-2 days.  Your symptoms go away and then come back. Get help right away if:  You have very bad back pain.  You have very bad pain in your lower belly.  You have a fever.  You are sick to your stomach (nauseous).  You are throwing up. Summary  A urinary tract infection (UTI) is an infection of any part of the urinary tract.  This condition is caused by germs in your genital area.  There are many risk factors for a UTI. These include having a small, thin tube to drain pee and not being able to control when you pee or poop.  Treatment includes antibiotic medicines for germs.  Drink enough fluid to keep your pee pale yellow. This information is not intended to replace advice given to you by your health care provider. Make sure you discuss any questions you have with your health care provider. Document Revised: 09/20/2018 Document Reviewed: 04/12/2018 Elsevier Patient Education  2020  [employer].

## 2020-08-17 ENCOUNTER — Telehealth: Payer: Self-pay

## 2020-08-17 LAB — CULTURE, URINE COMPREHENSIVE

## 2020-08-17 NOTE — Progress Notes (Signed)
Group B strep, Keflex should cover. Complete medication.  Return if symptoms persisting or worsening at anytime.

## 2020-08-17 NOTE — Telephone Encounter (Signed)
-----   Message from Berniece Pap, FNP sent at 08/17/2020  8:44 AM EDT ----- Group B strep, Keflex should cover. Complete medication.  Return if symptoms persisting or worsening at anytime.

## 2020-08-17 NOTE — Telephone Encounter (Signed)
Patient was advised and states she will let the office know if she not any better after completing the medication.

## 2020-08-26 ENCOUNTER — Other Ambulatory Visit: Payer: Self-pay

## 2020-08-26 ENCOUNTER — Encounter: Payer: Self-pay | Admitting: Physician Assistant

## 2020-08-26 ENCOUNTER — Ambulatory Visit (INDEPENDENT_AMBULATORY_CARE_PROVIDER_SITE_OTHER): Payer: BC Managed Care – PPO | Admitting: Physician Assistant

## 2020-08-26 VITALS — BP 120/70 | HR 81 | Temp 98.2°F | Resp 16 | Wt 179.2 lb

## 2020-08-26 DIAGNOSIS — L700 Acne vulgaris: Secondary | ICD-10-CM

## 2020-08-26 DIAGNOSIS — R300 Dysuria: Secondary | ICD-10-CM

## 2020-08-26 LAB — POCT URINALYSIS DIPSTICK
Bilirubin, UA: NEGATIVE
Glucose, UA: NEGATIVE
Ketones, UA: NEGATIVE
Leukocytes, UA: NEGATIVE
Nitrite, UA: NEGATIVE
Odor: POSITIVE
Protein, UA: NEGATIVE
Spec Grav, UA: >=1.03 — AB (ref 1.010–1.025)
Urobilinogen, UA: 0.2 E.U./dL
pH, UA: 6 (ref 5.0–8.0)

## 2020-08-26 MED ORDER — AMOXICILLIN-POT CLAVULANATE 875-125 MG PO TABS: 1.0000 | ORAL_TABLET | Freq: Two times a day (BID) | ORAL | 0 refills | Status: DC

## 2020-08-26 NOTE — Progress Notes (Signed)
Established patient visit   Patient: Cheryl Morgan   DOB: Nov 20, 1995   24 y.o. Female  MRN: [id number] Visit Date: 08/26/2020  Today's healthcare provider: Margaretann Loveless, PA-C   Chief Complaint  Patient presents with  . Possible UTI   Subjective    HPI  Patient was seen 08/13/20 for UTI symptoms and was placed on Keflex. Reports that she finished the antibiotic and now she has started to hurt again with urination with some hematuria. She has been pushing fluids.  Patient Active Problem List   Diagnosis Date Noted  . Bacterial vaginitis 08/13/2020  . Leukocytes in urine 08/13/2020  . COVID-19 05/21/2020  . Chest tightness 05/21/2020  . Cough 05/21/2020  . History of asthma 05/21/2020  . History of pneumonia 05/21/2020  . Fatigue 05/21/2020  . Wheezing 05/21/2020  . Dyspepsia   . Rectal bleeding   . 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 Dysmenorrhea 08/01/2012  . Excessive and frequent menstruation 08/01/2012   Past Medical History:  Diagnosis Date  . GERD (gastroesophageal reflux disease)   . Motion sickness    circular motion  . Seasonal allergies   . Weight gain 08/01/2012       Medications: Outpatient Medications Prior to Visit  Medication Sig  . clindamycin-benzoyl peroxide (BENZACLIN) gel Apply topically 2 (two) times daily.  . cephALEXin (KEFLEX) 500 MG capsule Take 1 capsule (500 mg total) by mouth 3 (three) times daily.   No facility-administered medications prior to visit.    Review of Systems  Constitutional: Negative.   Respiratory: Negative.   Cardiovascular: Negative.   Gastrointestinal: Positive for abdominal pain.  Genitourinary: Positive for dysuria, frequency and pelvic pain.    Last CBC Lab Results  Component Value Date   WBC 13.0 (H) 11/15/2018   HGB 14.7 11/15/2018   HCT 41.8 11/15/2018   MCV 95 11/15/2018   MCH 33.3 (H) 11/15/2018   RDW 11.5 (L) 11/15/2018   PLT 310  11/15/2018   Last metabolic panel Lab Results  Component Value Date   GLUCOSE 90 11/15/2018   NA 137 11/15/2018   K 4.0 11/15/2018   CL 98 11/15/2018   CO2 23 11/15/2018   BUN 8 11/15/2018   CREATININE 0.69 11/15/2018   GFRNONAA 123 11/15/2018   GFRAA 142 11/15/2018   CALCIUM 9.5 11/15/2018   PROT 7.2 11/15/2018   ALBUMIN 4.7 11/15/2018   LABGLOB 2.5 11/15/2018   AGRATIO 1.9 11/15/2018   BILITOT 0.7 11/15/2018   ALKPHOS 78 11/15/2018   AST 14 11/15/2018   ALT 7 11/15/2018   ANIONGAP 8 12/10/2011      Objective    BP 120/70 (BP Location: Left Arm, Patient Position: Sitting, Cuff Size: Large)   Pulse 81   Temp 98.2 F (36.8 C) (Oral)   Resp 16   Wt 179 lb 3.2 oz (81.3 kg)   BMI 28.07 kg/m  BP Readings from Last 3 Encounters:  08/26/20 120/70  08/13/20 107/69  07/01/20 105/70   Wt Readings from Last 3 Encounters:  08/26/20 179 lb 3.2 oz (81.3 kg)  08/13/20 174 lb (78.9 kg)  07/01/20 178 lb 9.6 oz (81 kg)      Physical Exam Vitals reviewed.  Constitutional:      General: She is not in acute distress.    Appearance: Normal appearance. She is well-developed. She is not ill-appearing or diaphoretic.  HENT:     Head: Normocephalic and atraumatic.  Cardiovascular:     Rate and Rhythm: Normal rate and regular rhythm.     Heart sounds: Normal heart sounds. No murmur heard.  No friction rub. No gallop.   Pulmonary:     Effort: Pulmonary effort is normal. No respiratory distress.     Breath sounds: Normal breath sounds. No wheezing or rales.  Abdominal:     General: Abdomen is flat. Bowel sounds are normal. There is no distension.     Palpations: Abdomen is soft. There is no mass.     Tenderness: There is abdominal tenderness in the suprapubic area. There is no right CVA tenderness, left CVA tenderness, guarding or rebound.  Musculoskeletal:     Cervical back: Normal range of motion and neck supple.  Skin:    General: Skin is warm and dry.     Findings: Acne  present.  Neurological:     Mental Status: She is alert and oriented to person, place, and time.  Psychiatric:        Behavior: Behavior normal.        Thought Content: Thought content normal.        Judgment: Judgment normal.      Results for orders placed or performed in visit on 08/26/20  POCT urinalysis dipstick  Result Value Ref Range   Color, UA Yellow    Clarity, UA Clear    Glucose, UA Negative Negative   Bilirubin, UA Negative    Ketones, UA Negative    Spec Grav, UA >=1.030 (A) 1.010 - 1.025   Blood, UA Large    pH, UA 6.0 5.0 - 8.0   Protein, UA Negative Negative   Urobilinogen, UA 0.2 0.2 or 1.0 E.U./dL   Nitrite, UA Negative    Leukocytes, UA Negative Negative   Appearance     Odor Positive     Assessment & Plan     1. Dysuria Worsening symptoms. UA positive. Will treat empirically with Augmentin as below due to previous culture being positive for Strep B. Continue to push fluids. Urine sent for culture. Will follow up pending C&S results. She is to call if symptoms do not improve or if they worsen.  - Urine Culture - amoxicillin-clavulanate (AUGMENTIN) 875-125 MG tablet; Take 1 tablet by mouth 2 (two) times daily.  Dispense: 14 tablet; Refill: 0  2. Acne vulgaris Bad outbreak on chin. Reports had quit using topical gel but recently started back. Followed by Dermatology.    No follow-ups on file.      Delmer Islam, PA-C, have reviewed all documentation for this visit. The documentation on 08/31/20 for the exam, diagnosis, procedures, and orders are all accurate and complete.   Reine Just  [facility] [phone] (phone) [phone] (fax)  [facility]

## 2020-08-29 LAB — URINE CULTURE

## 2020-08-31 ENCOUNTER — Encounter: Payer: Self-pay | Admitting: Physician Assistant

## 2020-09-17 ENCOUNTER — Ambulatory Visit (INDEPENDENT_AMBULATORY_CARE_PROVIDER_SITE_OTHER): Payer: BC Managed Care – PPO | Admitting: Dermatology

## 2020-09-17 ENCOUNTER — Other Ambulatory Visit: Payer: Self-pay

## 2020-09-17 DIAGNOSIS — L700 Acne vulgaris: Secondary | ICD-10-CM | POA: Diagnosis not present

## 2020-09-17 MED ORDER — CLINDAMYCIN PHOSPHATE 1 % EX LOTN: TOPICAL_LOTION | Freq: Every morning | CUTANEOUS | 4 refills | Status: AC

## 2020-09-17 MED ORDER — ADAPALENE 0.3 % EX GEL: 1.0000 "application " | Freq: Every day | CUTANEOUS | 4 refills | Status: DC

## 2020-09-17 MED ORDER — DOXYCYCLINE MONOHYDRATE 100 MG PO CAPS: 100.0000 mg | ORAL_CAPSULE | Freq: Every day | ORAL | 4 refills | Status: DC

## 2020-09-17 NOTE — Progress Notes (Signed)
   New Patient Visit  Subjective  Cheryl Morgan is a 24 y.o. female who presents for the following: Acne (face, 3 yrs, Benzaclin gel qhs, worse during cycle).  New patient referral from [facility] PA-C.  The following portions of the chart were reviewed this encounter and updated as appropriate:  Tobacco  Allergies  Meds  Problems  Med Hx  Surg Hx  Fam Hx     Review of Systems:  No other skin or systemic complaints except as noted in HPI or Assessment and Plan.  Objective  Well appearing patient in no apparent distress; mood and affect are within normal limits.  A focused examination was performed including face. Relevant physical exam findings are noted in the Assessment and Plan.  Objective  face: Paps with erythema of the chin, fine comedones entire face   Assessment & Plan  Acne vulgaris -chronic persistent face Start Doxycycline 100mg  1 po qd with food and drink Start Differin 0.3% gel qhs to face Start Clindamycin lotion qam to face D/c Benzaclin gel Consider topical Winlevi in the future  Cont mild cleanser Sample of Cerave am given  May consider adding winlevi or spironolactone in future  Doxycycline should be taken with food to prevent nausea. Do not lay down for 30 minutes after taking. Be cautious with sun exposure and use good sun protection while on this medication. Pregnant women should not take this medication.    Topical retinoid medications like tretinoin/Retin-A, adapalene/Differin, tazarotene/Fabior, and Epiduo/Epiduo Forte can cause dryness and irritation when first started. Only apply a pea-sized amount to the entire affected area. Avoid applying it around the eyes, edges of mouth and creases at the nose. If you experience irritation, use a good moisturizer first and/or apply the medicine less often. If you are doing well with the medicine, you can increase how often you use it until you are applying every night. Be careful with sun  protection while using this medication as it can make you sensitive to the sun. This medicine should not be used by pregnant women.    doxycycline (MONODOX) 100 MG capsule - face  Adapalene (DIFFERIN) 0.3 % gel - face  clindamycin (CLEOCIN-T) 1 % lotion - face  Other Related Medications clindamycin-benzoyl peroxide (BENZACLIN) gel  Return in about 10 weeks (around 11/26/2020) for acne.   I, 01/24/2021, RMA, am acting as scribe for Ardis Rowan, MD .   Documentation: I have reviewed the above documentation for accuracy and completeness, and I agree with the above.  Armida Sans, MD

## 2020-09-23 ENCOUNTER — Encounter: Payer: Self-pay | Admitting: Dermatology

## 2020-11-26 ENCOUNTER — Ambulatory Visit (INDEPENDENT_AMBULATORY_CARE_PROVIDER_SITE_OTHER): Payer: BC Managed Care – PPO | Admitting: Dermatology

## 2020-11-26 ENCOUNTER — Other Ambulatory Visit: Payer: Self-pay

## 2020-11-26 DIAGNOSIS — L700 Acne vulgaris: Secondary | ICD-10-CM

## 2020-11-26 NOTE — Progress Notes (Signed)
   Follow-Up Visit   Subjective  Cheryl Morgan is a 25 y.o. female who presents for the following: Acne (10 week follow up - still having some break outs. Taking doxycycline 100 mg 1 po qd and using Differin 0.3 % gel qhs, Clindamycin qam).  The following portions of the chart were reviewed this encounter and updated as appropriate:   Tobacco  Allergies  Meds  Problems  Med Hx  Surg Hx  Fam Hx     Review of Systems:  No other skin or systemic complaints except as noted in HPI or Assessment and Plan.  Objective  Well appearing patient in no apparent distress; mood and affect are within normal limits.  A focused examination was performed including face. Relevant physical exam findings are noted in the Assessment and Plan.  Objective  Face: Small pink papules   Assessment & Plan  Acne vulgaris Face Chronic and persistent.  Not to goal. Discussed adding Winlevi to current treatment vs Isotretinoin.  Many other members of her family have taken isotretinoin with good results.  Reviewed potential side effects of isotretinoin including xerosis, cheilitis, hepatitis, hyperlipidemia, and severe birth defects if taken by a pregnant woman. Reviewed reports of suicidal ideation in those with a history of depression while taking isotretinoin and reports of diagnosis of inflammatory bowl disease while taking isotretinoin as well as the lack of evidence for a causal relationship between isotretinoin, depression and IBD. Patient advised to reach out with any questions or concerns. Patient advised not to share pills or donate blood while on treatment or for one month after completing treatment.   She is not currently on any type of birth control. Recommend evaluation by GYN to start birth control. Will plan to start Isotretinoin on follow up if she has started birth control.  Urine pregnancy test performed in office today and was negative.  Patient demonstrates comprehension and confirms she  will not get pregnant. Lot #[id number] Exp 05/16/2022  Consent forms signed today and patient registered in [employer]. [employer] #[id number].  Continue Doxycyline 100 mg 1 po qd with food and plenty of fluid, Clindamycin lotion qam, Differin 0.3% gel qhs  Other Related Medications doxycycline (MONODOX) 100 MG capsule Adapalene (DIFFERIN) 0.3 % gel clindamycin (CLEOCIN-T) 1 % lotion  Return in about 30 days (around 12/26/2020).   I, Joanie Coddington, CMA, am acting as scribe for Armida Sans, MD .  Documentation: I have reviewed the above documentation for accuracy and completeness, and I agree with the above.  Armida Sans, MD

## 2020-11-26 NOTE — Patient Instructions (Signed)
Reviewed potential side effects of isotretinoin including xerosis, cheilitis, hepatitis, hyperlipidemia, and severe birth defects if taken by a pregnant woman. Reviewed reports of suicidal ideation in those with a history of depression while taking isotretinoin and reports of diagnosis of inflammatory bowl disease while taking isotretinoin as well as the lack of evidence for a causal relationship between isotretinoin, depression and IBD. Patient advised to reach out with any questions or concerns. Patient advised not to share pills or donate blood while on treatment or for one month after completing treatment.   

## 2020-11-29 ENCOUNTER — Encounter: Payer: Self-pay | Admitting: Dermatology

## 2020-12-09 ENCOUNTER — Telehealth (INDEPENDENT_AMBULATORY_CARE_PROVIDER_SITE_OTHER): Payer: BC Managed Care – PPO | Admitting: Physician Assistant

## 2020-12-09 DIAGNOSIS — R5383 Other fatigue: Secondary | ICD-10-CM | POA: Diagnosis not present

## 2020-12-09 DIAGNOSIS — Z30011 Encounter for initial prescription of contraceptive pills: Secondary | ICD-10-CM

## 2020-12-09 DIAGNOSIS — R059 Cough, unspecified: Secondary | ICD-10-CM | POA: Diagnosis not present

## 2020-12-09 MED ORDER — LEVONORGESTREL-ETHINYL ESTRAD 0.1-20 MG-MCG PO TABS: 1.0000 | ORAL_TABLET | Freq: Every day | ORAL | 3 refills | Status: DC

## 2020-12-09 MED ORDER — PROMETHAZINE-DM 6.25-15 MG/5ML PO SYRP: 5.0000 mL | ORAL_SOLUTION | Freq: Three times a day (TID) | ORAL | 0 refills | Status: DC | PRN

## 2020-12-09 NOTE — Patient Instructions (Signed)

## 2020-12-09 NOTE — Progress Notes (Signed)
MyChart Video Visit    Virtual Visit via Video Note   This visit type was conducted due to national recommendations for restrictions regarding the COVID-19 Pandemic (e.g. social distancing) in an effort to limit this patient's exposure and mitigate transmission in our community. This patient is at least at moderate risk for complications without adequate follow up. This format is felt to be most appropriate for this patient at this time. Physical exam was limited by quality of the video and audio technology used for the visit.   Patient location: Home Provider location: Office   I discussed the limitations of evaluation and management by telemedicine and the availability of in person appointments. The patient expressed understanding and agreed to proceed.  Patient: Cheryl Morgan   DOB: 1996/02/01   25 y.o. Female  MRN: [id number] Visit Date: 12/09/2020  Today's healthcare provider: Trey Sailors, PA-C   Chief Complaint  Patient presents with  . Cough  . Fatigue   Subjective    HPI   Reports chest discomfort, muscle pain, loss appetite, cough since Saturday. Denies fever. Reports some nausea without vomiting. Denies diarrhea. Has not been tested for the flu and COVID. PCR test for COVID on Monday and then rapid.   She also needs to start birth control as she is about to take Accutane. She has previously been on OCP without issues.     Medications: Outpatient Medications Prior to Visit  Medication Sig  . Adapalene (DIFFERIN) 0.3 % gel Apply 1 application topically at bedtime.  Marland Kitchen amoxicillin-clavulanate (AUGMENTIN) 875-125 MG tablet Take 1 tablet by mouth 2 (two) times daily. (Patient not taking: Reported on 09/17/2020)  . clindamycin (CLEOCIN-T) 1 % lotion Apply topically in the morning. qam to face for acne  . doxycycline (MONODOX) 100 MG capsule Take 1 capsule (100 mg total) by mouth daily. 1 po qd with food and drink, take with dinner meal   No facility-administered  medications prior to visit.    Review of Systems    Objective    There were no vitals taken for this visit.   Physical Exam Constitutional:      Appearance: Normal appearance.  Pulmonary:     Effort: Pulmonary effort is normal. No respiratory distress.  Neurological:     Mental Status: She is alert.  Psychiatric:        Mood and Affect: Mood normal.        Behavior: Behavior normal.        Assessment & Plan    1. Other fatigue   - symptoms c/w viral URI  - no evidence of strep pharyngitis, CAP, AOM, bacterial sinusitis, or other bacterial infection - concern for possible COVID19 infection - will send for outpatient testing - discussed need to quarantine 10 days from start of symptoms (or possibly 5 days with new CDC recommendations) and until fever-free for at least 24 hours - discussed symptomatic management, natural course, and return precautions \  - COVID-19, Flu A+B and RSV  2. Cough  - COVID-19, Flu A+B and RSV - promethazine-dextromethorphan (PROMETHAZINE-DM) 6.25-15 MG/5ML syrup; Take 5 mLs by mouth 3 (three) times daily as needed.  Dispense: 118 mL; Refill: 0  3. Encounter for initial prescription of contraceptive pills  - levonorgestrel-ethinyl estradiol (LESSINA-28) 0.1-20 MG-MCG tablet; Take 1 tablet by mouth daily.  Dispense: 84 tablet; Refill: 3    Return if symptoms worsen or fail to improve.     I discussed the assessment and treatment plan  with the patient. The patient was provided an opportunity to ask questions and all were answered. The patient agreed with the plan and demonstrated an understanding of the instructions.   The patient was advised to call back or seek an in-person evaluation if the symptoms worsen or if the condition fails to improve as anticipated.    ITrey Sailors, PA-C, have reviewed all documentation for this visit. The documentation on 12/10/20 for the exam, diagnosis, procedures, and orders are all accurate and  complete.  The entirety of the information documented in the History of Present Illness, Review of Systems and Physical Exam were personally obtained by me. Portions of this information were initially documented by [facility] and reviewed by me for thoroughness and accuracy.    Maryella Shivers [facility] [phone] (phone) [phone] (fax)  [facility]

## 2020-12-10 ENCOUNTER — Encounter: Payer: Self-pay | Admitting: Physician Assistant

## 2020-12-11 LAB — COVID-19, FLU A+B AND RSV
Influenza A, NAA: NOT DETECTED
Influenza B, NAA: NOT DETECTED
RSV, NAA: NOT DETECTED
SARS-CoV-2, NAA: NOT DETECTED

## 2020-12-18 ENCOUNTER — Encounter: Payer: Self-pay | Admitting: Physician Assistant

## 2020-12-18 ENCOUNTER — Ambulatory Visit (INDEPENDENT_AMBULATORY_CARE_PROVIDER_SITE_OTHER): Payer: BC Managed Care – PPO | Admitting: Physician Assistant

## 2020-12-18 ENCOUNTER — Other Ambulatory Visit: Payer: Self-pay

## 2020-12-18 VITALS — BP 102/81 | HR 83 | Temp 97.9°F | Wt 171.4 lb

## 2020-12-18 DIAGNOSIS — R300 Dysuria: Secondary | ICD-10-CM

## 2020-12-18 LAB — POCT URINALYSIS DIPSTICK
Bilirubin, UA: NEGATIVE
Blood, UA: NEGATIVE
Glucose, UA: NEGATIVE
Ketones, UA: NEGATIVE
Leukocytes, UA: NEGATIVE
Nitrite, UA: NEGATIVE
Protein, UA: NEGATIVE
Spec Grav, UA: >=1.03 — AB (ref 1.010–1.025)
Urobilinogen, UA: 0.2 E.U./dL
pH, UA: 6 (ref 5.0–8.0)

## 2020-12-18 MED ORDER — CEPHALEXIN 500 MG PO CAPS: 500.0000 mg | ORAL_CAPSULE | Freq: Two times a day (BID) | ORAL | 0 refills | Status: AC

## 2020-12-18 NOTE — Patient Instructions (Signed)
Urinary Tract Infection, Adult  A urinary tract infection (UTI) is an infection of any part of the urinary tract. The urinary tract includes the kidneys, ureters, bladder, and urethra. These organs make, store, and get rid of urine in the body. An upper UTI affects the ureters and kidneys. A lower UTI affects the bladder and urethra. What are the causes? Most urinary tract infections are caused by bacteria in your genital area around your urethra, where urine leaves your body. These bacteria grow and cause inflammation of your urinary tract. What increases the risk? You are more likely to develop this condition if:  You have a urinary catheter that stays in place.  You are not able to control when you urinate or have a bowel movement (incontinence).  You are female and you: ? Use a spermicide or diaphragm for birth control. ? Have low estrogen levels. ? Are pregnant.  You have certain genes that increase your risk.  You are sexually active.  You take antibiotic medicines.  You have a condition that causes your flow of urine to slow down, such as: ? An enlarged prostate, if you are female. ? Blockage in your urethra. ? A kidney stone. ? A nerve condition that affects your bladder control (neurogenic bladder). ? Not getting enough to drink, or not urinating often.  You have certain medical conditions, such as: ? Diabetes. ? A weak disease-fighting system (immunesystem). ? Sickle cell disease. ? Gout. ? Spinal cord injury. What are the signs or symptoms? Symptoms of this condition include:  Needing to urinate right away (urgency).  Frequent urination. This may include small amounts of urine each time you urinate.  Pain or burning with urination.  Blood in the urine.  Urine that smells bad or unusual.  Trouble urinating.  Cloudy urine.  Vaginal discharge, if you are female.  Pain in the abdomen or the lower back. You may also have:  Vomiting or a decreased  appetite.  Confusion.  Irritability or tiredness.  A fever or chills.  Diarrhea. The first symptom in older adults may be confusion. In some cases, they may not have any symptoms until the infection has worsened. How is this diagnosed? This condition is diagnosed based on your medical history and a physical exam. You may also have other tests, including:  Urine tests.  Blood tests.  Tests for STIs (sexually transmitted infections). If you have had more than one UTI, a cystoscopy or imaging studies may be done to determine the cause of the infections. How is this treated? Treatment for this condition includes:  Antibiotic medicine.  Over-the-counter medicines to treat discomfort.  Drinking enough water to stay hydrated. If you have frequent infections or have other conditions such as a kidney stone, you may need to see a health care provider who specializes in the urinary tract (urologist). In rare cases, urinary tract infections can cause sepsis. Sepsis is a life-threatening condition that occurs when the body responds to an infection. Sepsis is treated in the hospital with IV antibiotics, fluids, and other medicines. Follow these instructions at home: Medicines  Take over-the-counter and prescription medicines only as told by your health care provider.  If you were prescribed an antibiotic medicine, take it as told by your health care provider. Do not stop using the antibiotic even if you start to feel better. General instructions  Make sure you: ? Empty your bladder often and completely. Do not hold urine for long periods of time. ? Empty your bladder after   sex. ? Wipe from front to back after urinating or having a bowel movement if you are female. Use each tissue only one time when you wipe.  Drink enough fluid to keep your urine pale yellow.  Keep all follow-up visits. This is important.   Contact a health care provider if:  Your symptoms do not get better after 1-2  days.  Your symptoms go away and then return. Get help right away if:  You have severe pain in your back or your lower abdomen.  You have a fever or chills.  You have nausea or vomiting. Summary  A urinary tract infection (UTI) is an infection of any part of the urinary tract, which includes the kidneys, ureters, bladder, and urethra.  Most urinary tract infections are caused by bacteria in your genital area.  Treatment for this condition often includes antibiotic medicines.  If you were prescribed an antibiotic medicine, take it as told by your health care provider. Do not stop using the antibiotic even if you start to feel better.  Keep all follow-up visits. This is important. This information is not intended to replace advice given to you by your health care provider. Make sure you discuss any questions you have with your health care provider. Document Revised: 05/15/2020 Document Reviewed: 05/15/2020 Elsevier Patient Education  2021 Elsevier Inc.  

## 2020-12-18 NOTE — Progress Notes (Signed)
Established patient visit   Patient: Cheryl Morgan   DOB: 12-16-1995   25 y.o. Female  MRN: [id number] Visit Date: 12/18/2020  Today's healthcare provider: Trey Sailors, PA-C   Chief Complaint  Patient presents with  . Dysuria  I,Porsha C McClurkin,acting as a scribe for Trey Sailors, PA-C.,have documented all relevant documentation on the behalf of Trey Sailors, PA-C,as directed by  Trey Sailors, PA-C while in the presence of Trey Sailors, PA-C.  Subjective    Dysuria  This is a new problem. The current episode started in the past 7 days. The problem has been unchanged. The quality of the pain is described as burning. The pain is at a severity of 4/10. The pain is mild. There has been no fever. Associated symptoms include frequency and urgency. Pertinent negatives include no discharge, flank pain, hesitancy, nausea or vomiting. Treatments tried: AZO. The treatment provided moderate relief. Her past medical history is significant for recurrent UTIs.         Medications: Outpatient Medications Prior to Visit  Medication Sig  . Adapalene (DIFFERIN) 0.3 % gel Apply 1 application topically at bedtime.  . clindamycin (CLEOCIN-T) 1 % lotion Apply topically in the morning. qam to face for acne  . levonorgestrel-ethinyl estradiol (LESSINA-28) 0.1-20 MG-MCG tablet Take 1 tablet by mouth daily.  . promethazine-dextromethorphan (PROMETHAZINE-DM) 6.25-15 MG/5ML syrup Take 5 mLs by mouth 3 (three) times daily as needed.  Marland Kitchen amoxicillin-clavulanate (AUGMENTIN) 875-125 MG tablet Take 1 tablet by mouth 2 (two) times daily. (Patient not taking: Reported on 12/18/2020)  . doxycycline (MONODOX) 100 MG capsule Take 1 capsule (100 mg total) by mouth daily. 1 po qd with food and drink, take with dinner meal (Patient not taking: Reported on 12/18/2020)   No facility-administered medications prior to visit.    Review of Systems  Gastrointestinal: Negative for nausea and vomiting.   Genitourinary: Positive for dysuria, frequency and urgency. Negative for flank pain and hesitancy.       Objective    BP 102/81 (BP Location: Left Arm, Patient Position: Sitting, Cuff Size: Normal)   Pulse 83   Temp 97.9 F (36.6 C) (Oral)   Wt 171 lb 6.4 oz (77.7 kg)   SpO2 98%   BMI 26.85 kg/m     Physical Exam Constitutional:      General: She is not in acute distress.    Appearance: Normal appearance. She is well-developed and well-nourished. She is not diaphoretic.  Cardiovascular:     Rate and Rhythm: Normal rate and regular rhythm.  Pulmonary:     Effort: Pulmonary effort is normal.     Breath sounds: Normal breath sounds.  Abdominal:     General: Bowel sounds are normal. There is no distension.     Palpations: Abdomen is soft.     Tenderness: There is no abdominal tenderness. There is no CVA tenderness, guarding or rebound.  Skin:    General: Skin is warm and dry.  Neurological:     General: No focal deficit present.     Mental Status: She is alert and oriented to person, place, and time.  Psychiatric:        Mood and Affect: Mood and affect and mood normal.        Behavior: Behavior normal.       No results found for any visits on 12/18/20.  Assessment & Plan    1. Dysuria  UA rather benign, encouraged to wait  until culture returns to take abx.  - cephALEXin (KEFLEX) 500 MG capsule; Take 1 capsule (500 mg total) by mouth 2 (two) times daily for 5 days.  Dispense: 10 capsule; Refill: 0 - POCT urinalysis dipstick - Urine Culture    No follow-ups on file.      ITrey Sailors, PA-C, have reviewed all documentation for this visit. The documentation on 12/18/20 for the exam, diagnosis, procedures, and orders are all accurate and complete.  The entirety of the information documented in the History of Present Illness, Review of Systems and Physical Exam were personally obtained by me. Portions of this information were initially documented by [facility] and reviewed by me for thoroughness and accuracy.     Maryella Shivers  [facility] [phone] (phone) [phone] (fax)  [facility]

## 2020-12-21 LAB — URINE CULTURE

## 2020-12-31 ENCOUNTER — Ambulatory Visit: Payer: BC Managed Care – PPO | Admitting: Dermatology

## 2021-01-13 ENCOUNTER — Ambulatory Visit: Payer: Self-pay | Admitting: *Deleted

## 2021-01-13 NOTE — Telephone Encounter (Signed)
Yes agreed would need to be seen. I would worry this more being an allergic reaction. May need benadryl and/or pepcid for possible allergic reaction. Also could just be immune response as the tonsils are involved in our immune system and the covid vaccines cause spikes in the immune response. Would monitor. Salt water gargles and chloraseptic spray as needed for pain

## 2021-01-13 NOTE — Telephone Encounter (Signed)
Pt evasive historian. States had throat swelling with 1st covid vaccine 3 weeks ago. States had 2nd (Moderna) yesterday. Noted "Tonsils look swollen today." Reports was given antibiotics for previous episode 3 weeks ago. Noted pt went to [facility] 12/29/20, had antibiotics prescribed for strep. Pt states "I think my swollen throat evolved into strep. I just want to get ahead of it." Denies any sore throat, no swelling of tongue, face. States "My tonsils look puffy." reports some difficulty swallowing. Made aware would need to be seen before antibiotics prescribed. Assured pt NT would route to practice for PCPs review and final disposition. Advised ED if swelling worsens, any SOB, wheezing.   Pt verbalizes understanding. Please advise: [phone]  Reason for Disposition . [1] Swallowing difficulty AND [2] cause unknown (Exception: difficulty swallowing is a chronic symptom)  Answer Assessment - Initial Assessment Questions 1. SYMPTOM: "Are you having difficulty swallowing liquids, solids, or both?"     Both 2. ONSET: "When did the swallowing problems begin?"      Today, "Tonsils swelling, not bad." 3. CAUSE: "What do you think is causing the problem?"  Covid vaccine 2nd Moderna    4. CHRONIC/RECURRENT: "Is this a new problem for you?"  If no, ask: "How long have you had this problem?" (e.g., days, weeks, months)      Had the problem with 1st one 5. OTHER SYMPTOMS: "Do you have any other symptoms?" (e.g., difficulty breathing, sore throat, swollen tongue, chest pain)    No  Protocols used: SWALLOWING DIFFICULTY-A-AH

## 2021-01-14 NOTE — Telephone Encounter (Signed)
Patient advised and verbalized understanding. Patient states her symptoms haven't worsened so she is going to monitor them for now.

## 2021-11-26 ENCOUNTER — Other Ambulatory Visit: Payer: Self-pay

## 2021-11-26 ENCOUNTER — Encounter: Payer: Self-pay | Admitting: Physician Assistant

## 2021-11-26 ENCOUNTER — Ambulatory Visit
Admission: RE | Admit: 2021-11-26 | Discharge: 2021-11-26 | Disposition: A | Discharge status: Home / Self Care | Payer: Managed Care, Other (non HMO) | Attending: Physician Assistant | Admitting: Physician Assistant

## 2021-11-26 ENCOUNTER — Ambulatory Visit
Admission: RE | Admit: 2021-11-26 | Discharge: 2021-11-26 | Disposition: A | Discharge status: Home / Self Care | Payer: Managed Care, Other (non HMO) | Source: Ambulatory Visit | Attending: Physician Assistant | Admitting: Physician Assistant

## 2021-11-26 ENCOUNTER — Ambulatory Visit: Payer: Managed Care, Other (non HMO) | Admitting: Physician Assistant

## 2021-11-26 ENCOUNTER — Ambulatory Visit: Payer: Self-pay | Admitting: *Deleted

## 2021-11-26 VITALS — BP 110/67 | HR 85 | Temp 98.6°F | Resp 16 | Ht 67.0 in | Wt 170.4 lb

## 2021-11-26 DIAGNOSIS — R0789 Other chest pain: Secondary | ICD-10-CM

## 2021-11-26 DIAGNOSIS — M25512 Pain in left shoulder: Secondary | ICD-10-CM | POA: Insufficient documentation

## 2021-11-26 MED ORDER — DICLOFENAC SODIUM 75 MG PO TBEC
75.0000 mg | DELAYED_RELEASE_TABLET | Freq: Two times a day (BID) | ORAL | 0 refills | Status: DC
Start: 2021-11-26 — End: 2022-04-07

## 2021-11-26 MED ORDER — ACETAMINOPHEN ER 650 MG PO TBCR
650.0000 mg | EXTENDED_RELEASE_TABLET | Freq: Three times a day (TID) | ORAL | 0 refills | Status: DC | PRN
Start: 2021-11-26 — End: 2023-01-02

## 2021-11-26 NOTE — Progress Notes (Signed)
Established patient visit   Patient: Cheryl Morgan   DOB: 1996-04-05   26 y.o. Female  MRN: [id number] Visit Date: 11/26/2021  Today's healthcare provider: Oswaldo Conroy Lilybelle Mayeda, PA-C  Introduced myself to the patient as a Secondary school teacher and provided education on APPs in clinical practice.    Chief Complaint  Patient presents with   Chest Injury   Subjective    HPI  Patient here to be seen today for chest injury. Initial Onset: yesterday at the gym while doing triceps machine. Reports that she heard a pop. She hurts on the L side radiating collar bone to shoulder. Aggravated: movements, pain with breathing.  States yesterday she heard a popping sound with shifting over her sternum followed by pain in her chest and along left arm and shoulder Alleviating: denies alleviation with OTC pain medications and heating pad States pain is constant and is affecting her breathing slightly   Medications: Outpatient Medications Prior to Visit  Medication Sig   Adapalene (DIFFERIN) 0.3 % gel Apply 1 application topically at bedtime.   levonorgestrel-ethinyl estradiol (LESSINA-28) 0.1-20 MG-MCG tablet Take 1 tablet by mouth daily.   promethazine-dextromethorphan (PROMETHAZINE-DM) 6.25-15 MG/5ML syrup Take 5 mLs by mouth 3 (three) times daily as needed.   No facility-administered medications prior to visit.    Review of Systems  Respiratory:  Positive for chest tightness and shortness of breath.   Cardiovascular:  Positive for chest pain.  Musculoskeletal:  Positive for arthralgias.      Objective    BP 110/67 (BP Location: Left Arm, Patient Position: Sitting, Cuff Size: Large)    Pulse 85    Temp 98.6 F (37 C) (Oral)    Resp 16    Ht 5\' 7"  (1.702 m)    Wt 170 lb 6.4 oz (77.3 kg)    BMI 26.69 kg/m  {Show previous vital signs (optional):23777}  Physical Exam Vitals reviewed.  Constitutional:      Appearance: Normal appearance.  HENT:     Head: Normocephalic and atraumatic.  Pulmonary:      Effort: Pulmonary effort is normal. No respiratory distress.     Breath sounds: Normal breath sounds and air entry.  Chest:     Chest wall: Tenderness present. No deformity.     Comments: Tenderness to light palpation over left pectoral area No deformities palpated over bilateral clavicles No deformities noted over sternum but tenderness to palpation  Musculoskeletal:     Right shoulder: No swelling or tenderness. Normal range of motion.     Left shoulder: Tenderness present. No swelling. Decreased range of motion. Decreased strength.       Back:     Comments: Decreased ROM in left shoulder with the following movements: internal rotation, external rotation, abduction, adduction Right shoulder ROM normal Strength at shoulders was reduced to 3-4/5  Tenderness to palpation present along left shoulder, clavicle, and pectoral muscle No obvious deformity of left shoulder present on exam    Neurological:     General: No focal deficit present.     Mental Status: She is alert and oriented to person, place, and time.     GCS: GCS eye subscore is 4. GCS verbal subscore is 5. GCS motor subscore is 6.     Motor: No tremor.     Gait: Gait normal.  Psychiatric:        Mood and Affect: Mood normal.        Behavior: Behavior normal.  Thought Content: Thought content normal.        Judgment: Judgment normal.      No results found for any visits on 11/26/21.  Assessment & Plan     Problem List Items Addressed This Visit   None Visit Diagnoses     Acute pain of left shoulder    -  Primary Acute, new problem  Inciting event was yesterday at gym Shoulder imaging did not reveal abnormality per my interpretation Provided referral to orthopedics for potential musculoskeletal injury Recommend rest, warm compresses and pain management with Diclofenac and Tylenol Follow up as needed for progress with these measures PT may be needed for continued recovery.  Potential imaging for persistent  symptoms to include MRI of shoulder for soft tissue injury     Relevant Medications   diclofenac (VOLTAREN) 75 MG EC tablet   acetaminophen (TYLENOL 8 HOUR) 650 MG CR tablet   Other Relevant Orders   DG Chest 2 View   DG Shoulder Left   Ambulatory referral to Orthopedic Surgery   Acute chest wall pain     Acute, new problem Occurred yesterday while at the gym  Suspect muscle etiology vs cardiac at this time Chest xray did not reveal abnormality per my interpretation  Provided pain management and referral to orthopedics for suspected shoulder involvement Follow up as necessary    Relevant Medications   diclofenac (VOLTAREN) 75 MG EC tablet   acetaminophen (TYLENOL 8 HOUR) 650 MG CR tablet   Other Relevant Orders   DG Chest 2 View        No follow-ups on file.   I, Mattisyn Cardona E Lulabelle Desta, PA-C, have reviewed all documentation for this visit. The documentation on 11/26/21 for the exam, diagnosis, procedures, and orders are all accurate and complete.   Dalayna Lauter, Mirian Mo MPH [facility]   Roselind Messier  [facility] [phone] (phone) [phone] (fax)  [facility]

## 2021-11-26 NOTE — Patient Instructions (Addendum)
We will contact you with the results of your imaging and what to do for management For pain I have sent in scripts for Tylenol 650mg  to be taken by mouth every eight hours as need as well as Diclofenac 75mg  to be taken by mouth every 12 hours as needed You can use warm compresses to the area to help with discomfort Recommend rest and gentle stretches to the area as you are able  I have sent in a referral to Orthopedics in case there was a musculoskeletal injury that will need further evaluation

## 2021-11-26 NOTE — Telephone Encounter (Signed)
Reason for Disposition  [1] MODERATE pain (e.g., interferes with normal activities) AND [2] pain is WORSE with breathing  Answer Assessment - Initial Assessment Questions 1. MECHANISM: "How did the injury happen?"     Gym injury- felt pop with tricep work 2. ONSET: "When did the injury happen?" (e.g., minutes, hours, days ago)     yesterday 3. LOCATION: "Where on the chest is the injury located?" "Where does it hurt?"     L side radiating collarbone to shoulder 4. CHEST OR RIB PAIN SEVERITY: "How bad is the pain?"  (e.g., Scale 1-10; mild, moderate, or severe)    - MILD (1-3): doesn't interfere with normal activities     - MODERATE (4-7): interferes with normal activities or awakens from sleep    - SEVERE (8-10): excruciating pain, unable to do any normal activities       Moderate 5. BREATHING DIFFICULTY: "Are you having any difficulty breathing?" If Yes, ask: "How bad is it?"  (e.g., none, mild, moderate, severe)       Pain with regular breathing 6: OTHER SYMPTOMS (e.g., cough, fever, rash)      No 7. PREGNANCY: "Is there any chance you are pregnant?" "When was your last menstrual period?"     No LMP_ 10/27/21  Protocols used: Chest Injury - Bending, Lifting, or Twisting-A-AH

## 2021-11-26 NOTE — Telephone Encounter (Signed)
°  Chief Complaint: chest injury Symptoms: chest pain- more l side- heard pop while doing tricep machine  Frequency: occurring yesterday Pertinent Negatives:  Disposition: [] ED /[] Urgent Care (no appt availability in office) / [x] Appointment(In office/virtual)/ []  [facility]/ [] Home Care/ [] Refused Recommended Disposition /[] [facility]/ []  Follow-up with PCP Additional Notes:

## 2021-11-29 ENCOUNTER — Telehealth: Payer: Self-pay

## 2021-11-29 ENCOUNTER — Encounter: Payer: Self-pay | Admitting: Physician Assistant

## 2021-11-29 NOTE — Telephone Encounter (Signed)
CXR from 2/10 is normal.  Doesn't look like shoulder XRay has been read by a radiologist yet.

## 2021-11-29 NOTE — Telephone Encounter (Signed)
Copied from CRM [id number]. Topic: General - Other >> Nov 29, 2021  9:58 AM Marylen Ponto wrote: Reason for CRM: Pt called for lab results. Pt requests call back once results are available. Cb# [phone]

## 2021-11-29 NOTE — Telephone Encounter (Signed)
Copied from CRM [id number]. Topic: General - Other >> Nov 29, 2021  1:48 PM Traci Sermon wrote: Reason for CRM: Pt called in stating with her hurt shoulder, she was wondering if PCP could write her note to be off work for today and the rest of the week, since she no longer has any pain medications, please advise.

## 2021-11-29 NOTE — Telephone Encounter (Signed)
Contacted patient to get clarification , patient stating that she saw Erin in office on 11/26/21 for acute pain of the left shoulder and states that she is still taking Voltaren and otc Tylenol for pain relief but states that pain is still present. Patient states that she is requesting a letter to be out of work this entire week for shoulder pain. According to Erins note it was recommended rest, warm compresses and pain management with Diclofenac and Tylenol, it was also stated that PT may be needed for continued recovery. Please advise if letter is appropriate for the week. KW

## 2021-11-29 NOTE — Telephone Encounter (Signed)
Denny Peon will be in office tomorrow and able to answer that. It is difficult to know without having evaluated her.  We could do light duty and lifting restrictions maybe instead. Will CC Erin to make a call tomorrow morning.

## 2021-11-29 NOTE — Telephone Encounter (Signed)
Patient advised.

## 2021-11-29 NOTE — Telephone Encounter (Signed)
Dr. Beryle Flock can you please review since Denny Peon is out of office? I dont see labs that was ordered from recent visit with Denny Peon but I do see a chest xray that in her cart from 11/26/21, it dosent look like results have been reviewed. Please review and advise.KW

## 2021-11-30 NOTE — Telephone Encounter (Signed)
Letter done and is in [place]. Patient to follow up appointment 12/02/2021.

## 2021-11-30 NOTE — Telephone Encounter (Signed)
Patient aware. Patient is asking if her work letter can be extend for the rest of the week.

## 2021-11-30 NOTE — Telephone Encounter (Signed)
Pt calling back to follow up on her request to be written out of work for the rest of the week. Pt states light duty is not an option.  Pt would appreciate a call back. Pt is struggling to get in/out of car, in general daily activities moving around.

## 2021-12-02 ENCOUNTER — Ambulatory Visit: Payer: Managed Care, Other (non HMO) | Admitting: Physician Assistant

## 2021-12-21 ENCOUNTER — Ambulatory Visit: Payer: Managed Care, Other (non HMO) | Admitting: Physician Assistant

## 2021-12-21 ENCOUNTER — Encounter: Payer: Self-pay | Admitting: Physician Assistant

## 2021-12-21 ENCOUNTER — Other Ambulatory Visit: Payer: Self-pay

## 2021-12-21 VITALS — BP 102/70 | HR 75 | Temp 97.8°F | Resp 16 | Wt 163.5 lb

## 2021-12-21 DIAGNOSIS — L309 Dermatitis, unspecified: Secondary | ICD-10-CM

## 2021-12-21 MED ORDER — TRIAMCINOLONE ACETONIDE 0.025 % EX CREA: TOPICAL_CREAM | Freq: Two times a day (BID) | CUTANEOUS | 0 refills | Status: DC

## 2021-12-21 MED ORDER — TRIAMCINOLONE ACETONIDE 0.025 % EX CREA: TOPICAL_CREAM | Freq: Two times a day (BID) | CUTANEOUS | Status: DC

## 2021-12-21 NOTE — Progress Notes (Signed)
?  ? ?I,Sulibeya S Dimas,acting as a Neurosurgeon for [employer], PA-C.,have documented all relevant documentation on the behalf of Jonny Longino E Antoneo Ghrist, PA-C,as directed by  [employer], PA-C while in the presence of Hence Derrick E Shamara Soza, PA-C. ? ? ?Established patient visit ? ? ?Patient: Cheryl Morgan   DOB: [birth year]   25 y.o. Female  MRN: [id number] ?Visit Date: 12/21/2021 ? ?Today's healthcare provider: Oswaldo Conroy Brenan [place], PA-C  ?Introduced myself to the patient as a Secondary school teacher and provided education on APPs in clinical practice.  ? ? ?Chief Complaint  ?Patient presents with  ? Rash  ? ?Subjective  ?  ?Rash ?This is a new problem. The current episode started in the past 7 days. The problem is unchanged. The affected locations include the chest (Left breast). The rash is characterized by itchiness. She was exposed to nothing. Pertinent negatives include no cough, diarrhea, shortness of breath or vomiting. Past treatments include nothing.   ? ? ?States she is not sure when it started but has been itching along the superior aspect of her left chest ?Noticed yesterday that there appeared to be a skin change ?Denies drainage or bleeding from area ?Denies changes to lotions, detergents, clothing that she is able to recall ? ?Denies pain in the area ?Reports only concern is itching ? ? ? ?Medications: ?Outpatient Medications Prior to Visit  ?Medication Sig  ? acetaminophen (TYLENOL 8 HOUR) 650 MG CR tablet Take 1 tablet (650 mg total) by mouth every 8 (eight) hours as needed for pain.  ? Adapalene (DIFFERIN) 0.3 % gel Apply 1 application topically at bedtime.  ? diclofenac (VOLTAREN) 75 MG EC tablet Take 1 tablet (75 mg total) by mouth 2 (two) times daily.  ? levonorgestrel-ethinyl estradiol (LESSINA-28) 0.1-20 MG-MCG tablet Take 1 tablet by mouth daily.  ? promethazine-dextromethorphan (PROMETHAZINE-DM) 6.25-15 MG/5ML syrup Take 5 mLs by mouth 3 (three) times daily as needed.  ? ?No facility-administered medications prior to visit.  ? ? ?Review  of Systems  ?Respiratory:  Negative for cough and shortness of breath.   ?Gastrointestinal:  Negative for diarrhea, nausea and vomiting.  ?Skin:  Positive for rash. Negative for color change and wound.  ?Allergic/Immunologic: Positive for environmental allergies.  ? ?  ?  Objective  ?  ?BP 102/70 (BP Location: Left Arm, Patient Position: Sitting, Cuff Size: Large)   Pulse 75   Temp 97.8 ?F (36.6 ?C) (Temporal)   Resp 16   Wt 163 lb 8 oz (74.2 kg)   LMP 11/27/2021 (Approximate)   SpO2 99%   BMI 25.61 kg/m?  ?BP Readings from Last 3 Encounters:  ?12/21/21 102/70  ?11/26/21 110/67  ?12/18/20 102/81  ? ?Wt Readings from Last 3 Encounters:  ?12/21/21 163 lb 8 oz (74.2 kg)  ?11/26/21 170 lb 6.4 oz (77.3 kg)  ?12/18/20 171 lb 6.4 oz (77.7 kg)  ? ?  ? ?Physical Exam ?Vitals reviewed.  ?Constitutional:   ?   General: She is awake.  ?   Appearance: Normal appearance. She is well-developed, well-groomed and normal weight.  ?HENT:  ?   Head: Normocephalic and atraumatic.  ?Eyes:  ?   General: Lids are normal.  ?   Extraocular Movements: Extraocular movements intact.  ?   Conjunctiva/sclera: Conjunctivae normal.  ?   Pupils: Pupils are equal, round, and reactive to light.  ?Pulmonary:  ?   Effort: Pulmonary effort is normal. No respiratory distress.  ?Skin: ?   Findings: Rash present. Rash is macular  and scaling.  ? ?    ?Neurological:  ?   Mental Status: She is alert.  ?Psychiatric:     ?   Attention and Perception: Attention normal.     ?   Mood and Affect: Mood and affect normal.     ?   Speech: Speech normal.     ?   Behavior: Behavior normal. Behavior is cooperative.  ?  ? ? ?No results found for any visits on 12/21/21. ? Assessment & Plan  ?  ? ?Problem List Items Addressed This Visit   ?None ?Visit Diagnoses   ? ? Dermatitis    -  Primary ?Acute, new problem ?Reports she noticed a rash on superior aspect of left areola about a week ago that is pruritic in nature ?She is unsure if it is getting larger ?On exam the  rash is consistent with atopic dermatitis- 2 cm macular, scaling rash  ?Will provide script for kenalog cream to assist with itching ?Reviewed use of detergents- dye and fragrance free, using gentle soaps and warm water for bathing rather than hot, avoiding new lotions or other topicals until resolved ?Recommend using Aquaphor or vaseline to maintain skin integrity  ?Follow up as needed for persistent symptoms ?  ? Relevant Medications  ? triamcinolone 0.025%-Cerave equivalent 1:1 cream mixture  ? ?  ? ? ? ?No follow-ups on file. ? ? ?I, Akiem Urieta E Demia Viera, PA-C, have reviewed all documentation for this visit. The documentation on 12/21/21 for the exam, diagnosis, procedures, and orders are all accurate and complete. ? ? ?Klayten Jolliff, Mirian Mo MPH ?[facility] ?[facility] ? ? ?No follow-ups on file.  ?   ? ? ?Zemirah Krasinski E Montgomery Rothlisberger, PA-C  ?[facility] ?[phone] (phone) ?[phone] (fax) ? ?[facility]  ?

## 2021-12-22 MED ORDER — TRIAMCINOLONE ACETONIDE 0.025 % EX CREA: 1.0000 "application " | TOPICAL_CREAM | Freq: Two times a day (BID) | CUTANEOUS | 0 refills | Status: DC

## 2021-12-22 NOTE — Addendum Note (Signed)
Addended by: Jacquelin Hawking on: 12/22/2021 11:25 AM ? ? Modules accepted: Orders ? ?

## 2022-02-08 IMAGING — CR DG CHEST 2V
1 series · 2 of 2 positions shown · non-contrast
Comparison: 08/22/2017.

CLINICAL DATA: Cough and chills.  Chest tightness.

EXAM:
CHEST - 2 VIEW

[Series 1: dg chest 2 view · 0.14mm/px · 2 of 2 slices shown]
[im 1/2]
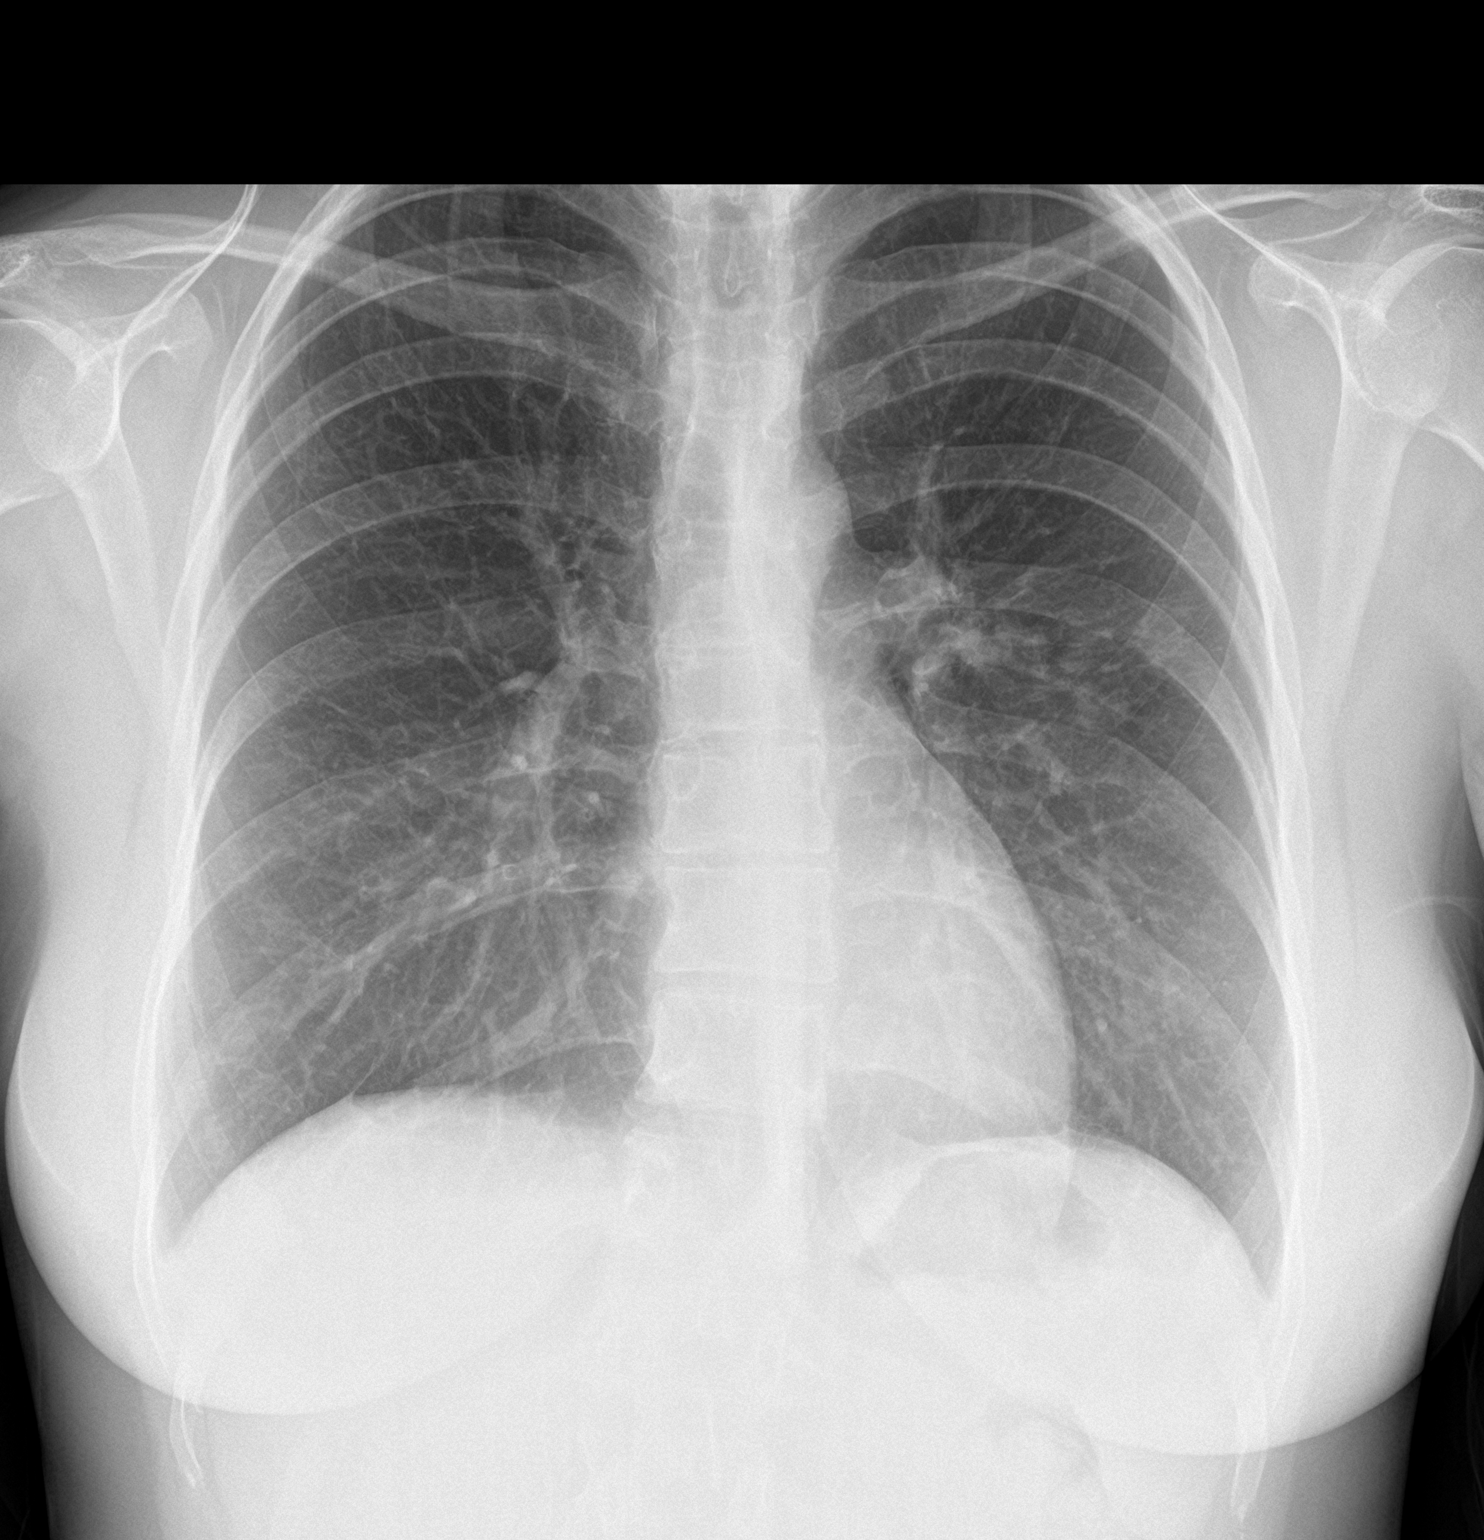
[im 2/2]
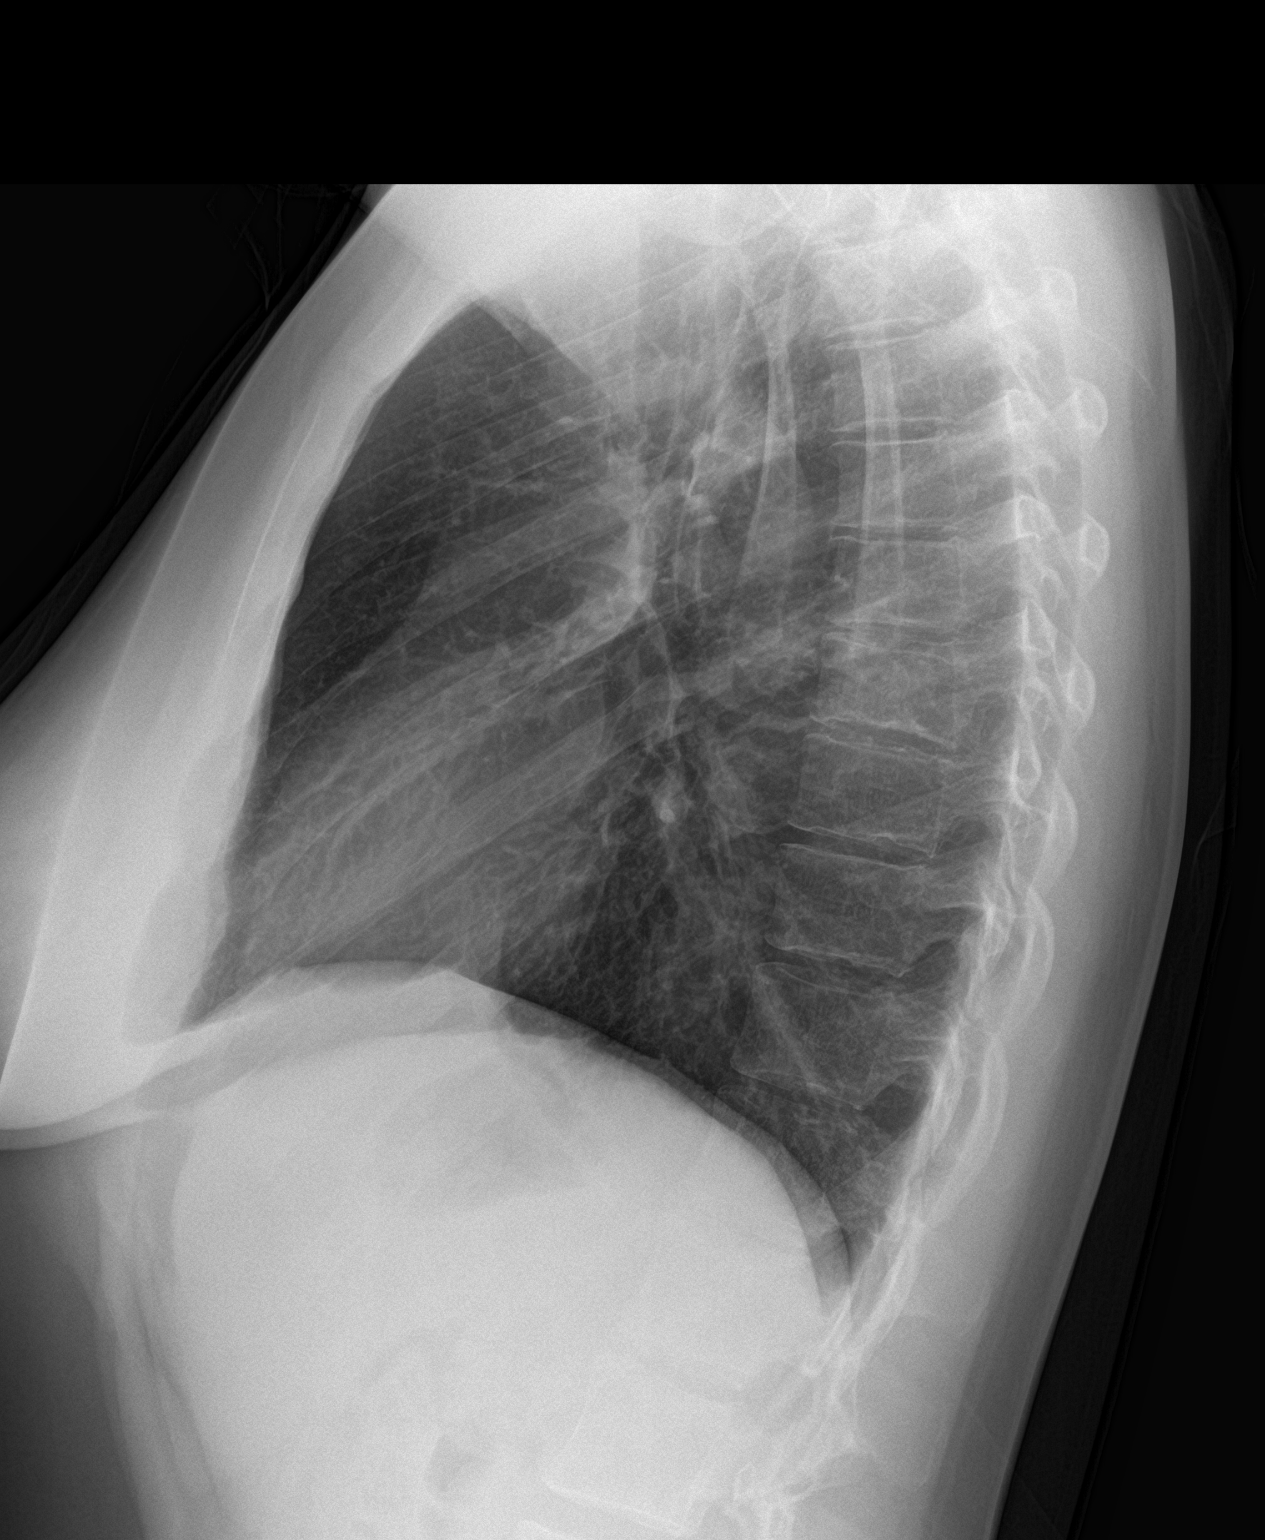

[2 of 2 positions shown; findings below may reference images not displayed]

FINDINGS: Mediastinum hilar structures normal. Mild left perihilar
infiltrate. No pleural effusion or pneumothorax. Thoracic spine
scoliosis and degenerative change.
IMPRESSION: Mild left perihilar infiltrate suggesting pneumonia.

## 2022-02-10 ENCOUNTER — Telehealth: Payer: Self-pay

## 2022-02-11 ENCOUNTER — Encounter: Payer: Self-pay | Admitting: Physician Assistant

## 2022-02-11 ENCOUNTER — Ambulatory Visit: Payer: Managed Care, Other (non HMO) | Admitting: Physician Assistant

## 2022-02-11 VITALS — BP 115/74 | HR 92 | Ht 67.0 in | Wt 165.1 lb

## 2022-02-11 DIAGNOSIS — Z0184 Encounter for antibody response examination: Secondary | ICD-10-CM

## 2022-02-11 NOTE — Assessment & Plan Note (Addendum)
History of one varicella and only one MMR vax in [place], likely did get both but no record. ?Advised she go back to clinic at work or health dept to get booster. ? ?History of all Hep B series, but pt states her titer was low. ?Again advised she get at work or health dept all together ? ?Gave titers in case work requires ?

## 2022-02-11 NOTE — Progress Notes (Signed)
?  ? ?I,Sha'taria Tyson,acting as a Education administrator for [employer], PA-C.,have documented all relevant documentation on the behalf of Mikey Kirschner, PA-C,as directed by  Mikey Kirschner, PA-C while in the presence of Mikey Kirschner, PA-C. ? ? ?Established patient visit ? ? ?Patient: Cheryl Morgan   DOB: Nov 08, 1995   26 y.o. Female  MRN: [id number] ?Visit Date: 02/11/2022 ? ?Today's healthcare provider: Mikey Kirschner, PA-C  ? ?Cc. vaccines ? ?Subjective  ?  ?HPI  ?Patient is requesting to receive Hep B shot/ Measels and Varicella. ?RN at [facility] suggested patient had not received all vaccines. 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  ? ?Medications: ?Outpatient Medications Prior to Visit  ?Medication Sig  ? acetaminophen (TYLENOL 8 HOUR) 650 MG CR tablet Take 1 tablet (650 mg total) by mouth every 8 (eight) hours as needed for pain.  ? Adapalene (DIFFERIN) 0.3 % gel Apply 1 application topically at bedtime.  ? diclofenac (VOLTAREN) 75 MG EC tablet Take 1 tablet (75 mg total) by mouth 2 (two) times daily.  ? levonorgestrel-ethinyl estradiol (LESSINA-28) 0.1-20 MG-MCG tablet Take 1 tablet by mouth daily.  ? promethazine-dextromethorphan (PROMETHAZINE-DM) 6.25-15 MG/5ML syrup Take 5 mLs by mouth 3 (three) times daily as needed.  ? triamcinolone (KENALOG) 0.025 % cream Apply 1 application. topically 2 (two) times daily.  ? ?No facility-administered medications prior to visit.  ? ? ?Review of Systems  ?Constitutional:  Negative for fatigue and fever.  ?Respiratory:  Negative for cough and shortness of breath.   ?Cardiovascular:  Negative for chest pain and leg swelling.  ?Gastrointestinal:  Negative for abdominal pain.  ?Neurological:  Negative for dizziness and headaches.  ? ?  Objective  ?  ?Blood pressure 115/74, pulse 92, height 5' 7"  (1.702 m), weight 165 lb 1.6 oz (74.9 kg), last menstrual period 01/28/2022, SpO2 100 %.  ? ?Physical Exam ?Vitals reviewed.  ?Constitutional:   ?    Appearance: She is not ill-appearing.  ?HENT:  ?   Head: Normocephalic.  ?Eyes:  ?   Conjunctiva/sclera: Conjunctivae normal.  ?Cardiovascular:  ?   Rate and Rhythm: Normal rate.  ?Pulmonary:  ?   Effort: Pulmonary effort is normal. No respiratory distress.  ?Neurological:  ?   General: No focal deficit present.  ?   Mental Status: She is alert and oriented to person, place, and time.  ?Psychiatric:     ?   Mood and Affect: Mood normal.     ?   Behavior: Behavior normal.  ?  ?No results found for any visits on 02/11/22. ? Assessment & Plan  ?  ? ?Problem List Items Addressed This Visit   ? ?  ? Other  ? Immunity to varicella determined by serologic test - Primary  ?  History of one varicella and only one MMR vax in [place], likely did get both but no record. ?Advised she go back to clinic at work or health dept to get booster. ? ?History of all Hep B series, but pt states her titer was low. ?Again advised she get at work or health dept all together ? ?Gave titers in case work requires ? ?  ?  ? Relevant Orders  ? Varicella zoster antibody, IgG  ? ?Other Visit Diagnoses   ? ? Immunity to measles, mumps, and rubella determined by serologic test      ? Relevant Orders  ? Measles/Mumps/Rubella Immunity  ? ?  ?  ?I, Mikey Kirschner, PA-C have reviewed  all documentation for this visit. The documentation on  02/11/2022 for the exam, diagnosis, procedures, and orders are all accurate and complete. ? ?Mikey Kirschner, PA-C ?[facility] [id number] ?[place], [place], [address] ?Office: [phone] ?Fax: [phone]  ? ?[facility] ? ?

## 2022-02-11 NOTE — Telephone Encounter (Signed)
Patient sen in office today

## 2022-04-05 NOTE — Progress Notes (Signed)
Established patient visit   Patient: Cheryl Morgan   DOB: 10/01/1996   26 y.o. Female  MRN: [id number] Visit Date: 04/07/2022  Today's healthcare provider: Jacky Kindle, FNP  Patient presents for new patient visit to establish care.  Introduced to Publishing rights manager role and practice setting.  All questions answered.  Discussed provider/patient relationship and expectations.   I,Tiffany J Bragg,acting as a scribe for Jacky Kindle, FNP.,have documented all relevant documentation on the behalf of Jacky Kindle, FNP,as directed by  Jacky Kindle, FNP while in the presence of Jacky Kindle, FNP.   Chief Complaint  Patient presents with   Bleeding/Bruising    Patient complains of legs bruising easily, legs itching at night, and different skin tone for a week and a half.    Subjective    HPI HPI     Bleeding/Bruising    Additional comments: Patient complains of legs bruising easily, legs itching at night, and different skin tone for a week and a half.       Last edited by Marlana Salvage, CMA on 04/07/2022  8:09 AM.        Medications: Outpatient Medications Prior to Visit  Medication Sig   acetaminophen (TYLENOL 8 HOUR) 650 MG CR tablet Take 1 tablet (650 mg total) by mouth every 8 (eight) hours as needed for pain.   [DISCONTINUED] Adapalene (DIFFERIN) 0.3 % gel Apply 1 application topically at bedtime. (Patient not taking: Reported on 02/11/2022)   [DISCONTINUED] diclofenac (VOLTAREN) 75 MG EC tablet Take 1 tablet (75 mg total) by mouth 2 (two) times daily. (Patient not taking: Reported on 02/11/2022)   [DISCONTINUED] levonorgestrel-ethinyl estradiol (LESSINA-28) 0.1-20 MG-MCG tablet Take 1 tablet by mouth daily. (Patient not taking: Reported on 02/11/2022)   [DISCONTINUED] promethazine-dextromethorphan (PROMETHAZINE-DM) 6.25-15 MG/5ML syrup Take 5 mLs by mouth 3 (three) times daily as needed. (Patient not taking: Reported on 02/11/2022)   [DISCONTINUED] triamcinolone  (KENALOG) 0.025 % cream Apply 1 application. topically 2 (two) times daily. (Patient not taking: Reported on 02/11/2022)   No facility-administered medications prior to visit.    Review of Systems     Objective    BP 110/84 (BP Location: Right Arm, Patient Position: Sitting, Cuff Size: Normal)   Pulse 77   Temp 97.8 F (36.6 C) (Oral)   Resp 16   Ht 5\' 7"  (1.702 m)   Wt 169 lb 1.6 oz (76.7 kg)   SpO2 98%   BMI 26.48 kg/m    Physical Exam Vitals and nursing note reviewed.  Constitutional:      General: She is not in acute distress.    Appearance: Normal appearance. She is overweight. She is not ill-appearing, toxic-appearing or diaphoretic.  HENT:     Head: Normocephalic and atraumatic.  Cardiovascular:     Rate and Rhythm: Normal rate and regular rhythm.     Pulses: Normal pulses.     Heart sounds: Normal heart sounds. No murmur heard.    No friction rub. No gallop.  Pulmonary:     Effort: Pulmonary effort is normal. No respiratory distress.     Breath sounds: Normal breath sounds. No stridor. No wheezing, rhonchi or rales.  Chest:     Chest wall: No tenderness.  Abdominal:     General: Bowel sounds are normal.     Palpations: Abdomen is soft.  Musculoskeletal:        General: No swelling, tenderness, deformity or signs of injury. Normal range  of motion.     Right lower leg: No edema.     Left lower leg: No edema.  Skin:    General: Skin is warm and dry.     Capillary Refill: Capillary refill takes less than 2 seconds.     Coloration: Skin is not jaundiced or pale.     Findings: Bruising and erythema present. No lesion or rash.       Neurological:     General: No focal deficit present.     Mental Status: She is alert and oriented to person, place, and time. Mental status is at baseline.     Cranial Nerves: No cranial nerve deficit.     Sensory: No sensory deficit.     Motor: No weakness.     Coordination: Coordination normal.  Psychiatric:        Mood and  Affect: Mood normal.        Behavior: Behavior normal.        Thought Content: Thought content normal.        Judgment: Judgment normal.     No results found for any visits on 04/07/22.  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 [facility]; noted minor skin irritation on R upper arm with slight raised maculopapular rash Denies exudate Denies travel/known exposure to allergens        Relevant Medications   triamcinolone ointment (KENALOG) 0.5 %     Other   Bruising - Primary    Acute, unknown cause Discussed possible causes including but not limited to use of steroids, binge drinking, vitamin/dietary, NSAIDs Denies trauma Legs are itchy and splotchy; however, patient denies use of new soaps, detergents, food allergies etc Recommend lab for for CBC and platelets as well as APTT      Relevant Orders   CBC with Differential/Platelet   APTT   Nicotine dependence due to vaping tobacco product    Chronic, stable Remains pre-contemplative regarding quitting at this time Vapes 2-5% cards per week Has never used cigarettes Aware of risks related to vaping         Return if symptoms worsen or fail to improve.      Leilani Merl, FNP, have reviewed all documentation for this visit. The documentation on 04/07/22 for the exam, diagnosis, procedures, and orders are all accurate and complete.    Jacky Kindle, FNP  [facility] [phone] (phone) [phone] (fax)  [facility]

## 2022-04-07 ENCOUNTER — Encounter: Payer: Self-pay | Admitting: Family Medicine

## 2022-04-07 ENCOUNTER — Ambulatory Visit: Payer: 59 | Admitting: Family Medicine

## 2022-04-07 VITALS — BP 110/84 | HR 77 | Temp 97.8°F | Resp 16 | Ht 67.0 in | Wt 169.1 lb

## 2022-04-07 DIAGNOSIS — F17290 Nicotine dependence, other tobacco product, uncomplicated: Secondary | ICD-10-CM | POA: Diagnosis not present

## 2022-04-07 DIAGNOSIS — L2089 Other atopic dermatitis: Secondary | ICD-10-CM | POA: Diagnosis not present

## 2022-04-07 DIAGNOSIS — T148XXA Other injury of unspecified body region, initial encounter: Secondary | ICD-10-CM | POA: Insufficient documentation

## 2022-04-07 MED ORDER — TRIAMCINOLONE ACETONIDE 0.5 % EX OINT: 1.0000 | TOPICAL_OINTMENT | Freq: Two times a day (BID) | CUTANEOUS | 3 refills | Status: DC

## 2022-04-07 NOTE — Assessment & Plan Note (Signed)
Acute, unknown cause Discussed possible causes including but not limited to use of steroids, binge drinking, vitamin/dietary, NSAIDs Denies trauma Legs are itchy and splotchy; however, patient denies use of new soaps, detergents, food allergies etc Recommend lab for for CBC and platelets as well as APTT

## 2022-04-07 NOTE — Assessment & Plan Note (Signed)
Chronic, stable Remains pre-contemplative regarding quitting at this time Vapes 2-5% cards per week Has never used cigarettes Aware of risks related to vaping

## 2022-04-07 NOTE — Assessment & Plan Note (Signed)
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; noted minor skin irritation on R upper arm with slight raised maculopapular rash Denies exudate Denies travel/known exposure to allergens

## 2022-04-08 ENCOUNTER — Telehealth: Payer: Self-pay

## 2022-04-08 LAB — CBC WITH DIFFERENTIAL/PLATELET
Basophils Absolute: 0.1 10*3/uL (ref 0.0–0.2)
Basos: 1 %
EOS (ABSOLUTE): 0.2 10*3/uL (ref 0.0–0.4)
Eos: 4 %
Hematocrit: 42.6 % (ref 34.0–46.6)
Hemoglobin: 14.8 g/dL (ref 11.1–15.9)
Immature Grans (Abs): 0 10*3/uL (ref 0.0–0.1)
Immature Granulocytes: 0 %
Lymphocytes Absolute: 1.6 10*3/uL (ref 0.7–3.1)
Lymphs: 28 %
MCH: 33 pg (ref 26.6–33.0)
MCHC: 34.7 g/dL (ref 31.5–35.7)
MCV: 95 fL (ref 79–97)
Monocytes Absolute: 0.6 10*3/uL (ref 0.1–0.9)
Monocytes: 11 %
Neutrophils Absolute: 3.2 10*3/uL (ref 1.4–7.0)
Neutrophils: 56 %
Platelets: 369 10*3/uL (ref 150–450)
RBC: 4.49 x10E6/uL (ref 3.77–5.28)
RDW: 11.4 % — ABNORMAL LOW (ref 11.7–15.4)
WBC: 5.7 10*3/uL (ref 3.4–10.8)

## 2022-04-08 NOTE — Telephone Encounter (Signed)
Pt called, was given lab results from [place], NP on 04/08/22. Pt had questions about her monocytes and RDW. Advised her that these were close to normal and nothing to be considered of. The APTT says future so advised her I would send to provider to see if it was collected and sent to lab yesterday. Pt says they only drew 1 vial of blood.

## 2022-04-15 NOTE — Progress Notes (Unsigned)
    I,Sha'taria Tyson,acting as a Neurosurgeon for [employer], PA-C.,have documented all relevant documentation on the behalf of Alfredia Ferguson, PA-C,as directed by  Alfredia Ferguson, PA-C while in the presence of Alfredia Ferguson, PA-C.  Established patient visit   Patient: Cheryl Morgan   DOB: August 06, 1996   26 y.o. Female  MRN: [id number] Visit Date: 04/18/2022  Today's healthcare provider: Alfredia Ferguson, PA-C   No chief complaint on file.  Subjective    HPI  ***  Medications: Outpatient Medications Prior to Visit  Medication Sig   acetaminophen (TYLENOL 8 HOUR) 650 MG CR tablet Take 1 tablet (650 mg total) by mouth every 8 (eight) hours as needed for pain.   triamcinolone ointment (KENALOG) 0.5 % Apply 1 Application topically 2 (two) times daily. Use of pea size amount to area of irritation for max of 7 days   No facility-administered medications prior to visit.    Review of Systems  {Labs  Heme  Chem  Endocrine  Serology  Results Review (optional):23779}   Objective    There were no vitals taken for this visit. {Show previous vital signs (optional):23777}  Physical Exam  ***  No results found for any visits on 04/18/22.  Assessment & Plan     ***  No follow-ups on file.      {provider attestation***:1}   Alfredia Ferguson, PA-C  [facility] [phone] (phone) [phone] (fax)  [facility]

## 2022-04-18 ENCOUNTER — Encounter: Payer: Self-pay | Admitting: Physician Assistant

## 2022-04-18 ENCOUNTER — Ambulatory Visit: Payer: 59 | Admitting: Physician Assistant

## 2022-04-18 VITALS — BP 121/85 | HR 92 | Ht 67.0 in | Wt 169.4 lb

## 2022-04-18 DIAGNOSIS — K219 Gastro-esophageal reflux disease without esophagitis: Secondary | ICD-10-CM

## 2022-04-18 DIAGNOSIS — T148XXA Other injury of unspecified body region, initial encounter: Secondary | ICD-10-CM

## 2022-04-18 MED ORDER — OMEPRAZOLE 20 MG PO CPDR: 20.0000 mg | DELAYED_RELEASE_CAPSULE | Freq: Every day | ORAL | 1 refills | Status: DC

## 2022-04-18 NOTE — Assessment & Plan Note (Addendum)
CBC was WNL but will order bleeding studies, tsh/t4, cmp  No axillary or cervical lymphadenopathy; pt denies inguinal lymphadenopathy  Advised pt will likely refer to hematology regardless of results due to extensive bruising w/o etiology.

## 2022-04-18 NOTE — Assessment & Plan Note (Signed)
Treated choked feeling as gerd- start omeprazole 20 mg daily in AM on empty stomach.  Tonsillar edema and erythema, allergic vs viral

## 2022-04-19 LAB — COMPREHENSIVE METABOLIC PANEL
ALT: 13 IU/L (ref 0–32)
AST: 13 IU/L (ref 0–40)
Albumin/Globulin Ratio: 1.5 (ref 1.2–2.2)
Albumin: 4.5 g/dL (ref 3.9–5.0)
Alkaline Phosphatase: 76 IU/L (ref 44–121)
BUN/Creatinine Ratio: 13 (ref 9–23)
BUN: 10 mg/dL (ref 6–20)
Bilirubin Total: 0.4 mg/dL (ref 0.0–1.2)
CO2: 24 mmol/L (ref 20–29)
Calcium: 10.1 mg/dL (ref 8.7–10.2)
Chloride: 102 mmol/L (ref 96–106)
Creatinine, Ser: 0.78 mg/dL (ref 0.57–1.00)
Globulin, Total: 3.1 g/dL (ref 1.5–4.5)
Glucose: 89 mg/dL (ref 70–99)
Potassium: 4.3 mmol/L (ref 3.5–5.2)
Sodium: 140 mmol/L (ref 134–144)
Total Protein: 7.6 g/dL (ref 6.0–8.5)
eGFR: 107 mL/min/{1.73_m2} (ref >59)

## 2022-04-19 LAB — PT AND PTT
INR: 1 (ref 0.9–1.2)
Prothrombin Time: 11 s (ref 9.1–12.0)
aPTT: 29 s (ref 24–33)

## 2022-04-19 LAB — TSH+FREE T4
Free T4: 1.39 ng/dL (ref 0.82–1.77)
TSH: 2.48 u[IU]/mL (ref 0.450–4.500)

## 2022-04-20 ENCOUNTER — Other Ambulatory Visit: Payer: Self-pay | Admitting: Physician Assistant

## 2022-04-20 DIAGNOSIS — T148XXA Other injury of unspecified body region, initial encounter: Secondary | ICD-10-CM

## 2022-04-26 ENCOUNTER — Inpatient Hospital Stay: Payer: 59 | Attending: Oncology | Admitting: Oncology

## 2022-04-26 ENCOUNTER — Encounter: Payer: Self-pay | Admitting: Oncology

## 2022-04-26 ENCOUNTER — Inpatient Hospital Stay: Payer: 59

## 2022-04-26 VITALS — BP 111/80 | HR 78 | Temp 97.2°F | Resp 18 | Wt 164.3 lb

## 2022-04-26 DIAGNOSIS — R233 Spontaneous ecchymoses: Secondary | ICD-10-CM | POA: Insufficient documentation

## 2022-04-26 LAB — HEPATITIS PANEL, ACUTE
HCV Ab: NONREACTIVE
Hep A IgM: NONREACTIVE
Hep B C IgM: NONREACTIVE
Hepatitis B Surface Ag: NONREACTIVE

## 2022-04-26 LAB — CBC WITH DIFFERENTIAL/PLATELET
Abs Immature Granulocytes: 0.02 10*3/uL (ref 0.00–0.07)
Basophils Absolute: 0.1 10*3/uL (ref 0.0–0.1)
Basophils Relative: 1 %
Eosinophils Absolute: 0.2 10*3/uL (ref 0.0–0.5)
Eosinophils Relative: 2 %
HCT: 42.9 % (ref 36.0–46.0)
Hemoglobin: 14.6 g/dL (ref 12.0–15.0)
Immature Granulocytes: 0 %
Lymphocytes Relative: 14 %
Lymphs Abs: 1.3 10*3/uL (ref 0.7–4.0)
MCH: 32.3 pg (ref 26.0–34.0)
MCHC: 34 g/dL (ref 30.0–36.0)
MCV: 94.9 fL (ref 80.0–100.0)
Monocytes Absolute: 0.7 10*3/uL (ref 0.1–1.0)
Monocytes Relative: 8 %
Neutro Abs: 6.8 10*3/uL (ref 1.7–7.7)
Neutrophils Relative %: 75 %
Platelets: 314 10*3/uL (ref 150–400)
RBC: 4.52 MIL/uL (ref 3.87–5.11)
RDW: 11.4 % — ABNORMAL LOW (ref 11.5–15.5)
WBC: 9 10*3/uL (ref 4.0–10.5)
nRBC: 0 % (ref 0.0–0.2)

## 2022-04-26 LAB — PLATELET FUNCTION ASSAY: Collagen / Epinephrine: 99 seconds (ref 0–193)

## 2022-04-26 LAB — TECHNOLOGIST SMEAR REVIEW
Plt Morphology: NORMAL
RBC MORPHOLOGY: NORMAL
WBC MORPHOLOGY: NORMAL

## 2022-04-26 LAB — VITAMIN B12: Vitamin B-12: 497 pg/mL (ref 180–914)

## 2022-04-26 LAB — FOLATE: Folate: 20.2 ng/mL (ref >5.9)

## 2022-04-26 NOTE — Assessment & Plan Note (Signed)
Discussed with patient that usually bruising on extremities are less concerning than bruising of the torso. I recommend checking smear, CBC, ANA with reflex, hepatitis panel, vitamin C level, ANCA titers, ANA, folate and vitamin B12 for further evaluation.

## 2022-04-26 NOTE — Progress Notes (Signed)
Patient here to establish care  

## 2022-04-26 NOTE — Progress Notes (Signed)
[facility] CONSULT NOTE  REFERRING PROVIDER: Alfredia Ferguson, PA-C   Patient Care Team: Alfredia Ferguson, Cordelia Poche as PCP - General (Physician Assistant)  ASSESSMENT & PLAN:   Easy bruising Discussed with patient that usually bruising on extremities are less concerning than bruising of the torso. I recommend checking smear, CBC, ANA with reflex, hepatitis panel, vitamin C level, ANCA titers, ANA, folate and vitamin B12 for further evaluation.   Orders Placed This Encounter  Procedures   Vitamin B12    Standing Status:   Future    Number of Occurrences:   1    Standing Expiration Date:   04/27/2023   Folate    Standing Status:   Future    Number of Occurrences:   1    Standing Expiration Date:   04/27/2023   Technologist smear review    Standing Status:   Future    Number of Occurrences:   1    Standing Expiration Date:   04/27/2023   CBC with Differential/Platelet    Standing Status:   Future    Number of Occurrences:   1    Standing Expiration Date:   04/27/2023   ANA, IFA (with reflex)    Standing Status:   Future    Number of Occurrences:   1    Standing Expiration Date:   04/27/2023   Hepatitis panel, acute    Standing Status:   Future    Number of Occurrences:   1    Standing Expiration Date:   04/27/2023   Platelet function assay    Standing Status:   Future    Number of Occurrences:   1    Standing Expiration Date:   04/27/2023   ANCA Titers    Standing Status:   Future    Number of Occurrences:   1    Standing Expiration Date:   04/27/2023   Miscellaneous [facility] test (send-out)    Standing Status:   Future    Number of Occurrences:   1    Standing Expiration Date:   04/27/2023    Order Specific Question:   Test name / description:    Answer:   [id number] vitamin c  Follow-up to be determined.  All questions were answered. The patient knows to call the clinic with any problems, questions or concerns. No barriers to learning was detected.  Rickard Patience,  MD 04/26/2022   CHIEF COMPLAINTS/PURPOSE OF CONSULTATION:  Easy bruising  HISTORY OF PRESENTING ILLNESS:  Cheryl Morgan 26 y.o. female is referred to establish care for evaluation of easy bruising Patient has noticed easy bruising Recently she noticed multiple areas of bruising on her left thigh. She cannot recall any injury. She works in [employer] and she has had skin itchiness which she attributes to allergy to the scrubs. She stands 8 hours for her job She takes OTC herbal supplementation, for fatigue and chronic aches and pains. She drinks about 3 glasses of wine per week.  Smokes e-cigarettes. Her appetite is fair.  She denies any contraceptive medication currently.  IUD has been removed.  She takes NSAIDs occasionally as needed for discomfort during menstrual period as well as aches and pains.  Patient denies any spontaneous bleeding history.  Her menses not heavy.  MEDICAL HISTORY:  Past Medical History:  Diagnosis Date   GERD (gastroesophageal reflux disease)    Motion sickness    circular motion   Seasonal allergies    Weight gain 08/01/2012  SURGICAL HISTORY: Past Surgical History:  Procedure Laterality Date   ESOPHAGOGASTRODUODENOSCOPY (EGD) WITH PROPOFOL N/A 01/02/2019   Procedure: ESOPHAGOGASTRODUODENOSCOPY (EGD) WITH BIOPSIES;  Surgeon: Toney Reil, MD;  Location[facility];  Service: Endoscopy;  Laterality: N/A;   FLEXIBLE SIGMOIDOSCOPY N/A 01/02/2019   Procedure: FLEXIBLE SIGMOIDOSCOPY;  Surgeon: Toney Reil, MD;  Location: [facility];  Service: Endoscopy;  Laterality: N/A;   MOUTH SURGERY     tooth extraction   Skyla     Inserted 08-17-12    SOCIAL HISTORY: Social History   Socioeconomic History   Marital status: Single    Spouse name: Not on file   Number of children: Not on file   Years of education: Not on file   Highest education level: Not on file  Occupational History   Not on file  Tobacco Use   Smoking  status: Every Day    Types: E-cigarettes   Smokeless tobacco: Never   Tobacco comments:    Smoked "regular" cigarettes for approx 3 months in 2019  Vaping Use   Vaping Use: Every day   Start date: 12/15/2016   Substances: Nicotine, Flavoring   Devices: juul  Substance and Sexual Activity   Alcohol use: Yes    Alcohol/week: 3.0 standard drinks of alcohol    Types: 3 Glasses of wine per week   Drug use: No   Sexual activity: Yes    Birth control/protection: I.U.D.    Comment: Skyla inserted 08-17-12  Other Topics Concern   Not on file  Social History Narrative   Not on file   Social Determinants of Health   Financial Resource Strain: Not on file  Food Insecurity: Not on file  Transportation Needs: Not on file  Physical Activity: Not on file  Stress: Not on file  Social Connections: Not on file  Intimate Partner Violence: Not on file    FAMILY HISTORY: Family History  Problem Relation Age of Onset   Hypertension Maternal Aunt    Diabetes Maternal Grandmother    Hypertension Maternal Grandmother    Depression Maternal Grandmother     ALLERGIES:  has No Known Allergies.  MEDICATIONS:  Current Outpatient Medications  Medication Sig Dispense Refill   acetaminophen (TYLENOL 8 HOUR) 650 MG CR tablet Take 1 tablet (650 mg total) by mouth every 8 (eight) hours as needed for pain. 30 tablet 0   omeprazole (PRILOSEC) 20 MG capsule Take 1 capsule (20 mg total) by mouth daily. 90 capsule 1   triamcinolone ointment (KENALOG) 0.5 % Apply 1 Application topically 2 (two) times daily. Use of pea size amount to area of irritation for max of 7 days 30 g 3   No current facility-administered medications for this visit.    Review of Systems  Constitutional:  Positive for fatigue. Negative for appetite change, chills and fever.  HENT:   Negative for hearing loss and voice change.   Eyes:  Negative for eye problems.  Respiratory:  Negative for chest tightness and cough.    Cardiovascular:  Negative for chest pain.  Gastrointestinal:  Negative for abdominal distention, abdominal pain and blood in stool.  Endocrine: Negative for hot flashes.  Genitourinary:  Negative for difficulty urinating and frequency.   Musculoskeletal:  Positive for arthralgias.  Skin:  Negative for itching and rash.  Neurological:  Negative for extremity weakness.  Hematological:  Negative for adenopathy. Bruises/bleeds easily.  Psychiatric/Behavioral:  Negative for confusion.      PHYSICAL EXAMINATION: ECOG PERFORMANCE STATUS: 0 -  Asymptomatic  Vitals:   04/26/22 0938  BP: 111/80  Pulse: 78  Resp: 18  Temp: (!) 97.2 F (36.2 C)   Filed Weights   04/26/22 0938  Weight: 164 lb 4.8 oz (74.5 kg)    Physical Exam Constitutional:      General: She is not in acute distress.    Appearance: She is not diaphoretic.  HENT:     Head: Normocephalic and atraumatic.     Nose: Nose normal.     Mouth/Throat:     Pharynx: No oropharyngeal exudate.  Eyes:     General: No scleral icterus.    Pupils: Pupils are equal, round, and reactive to light.  Cardiovascular:     Rate and Rhythm: Normal rate and regular rhythm.     Heart sounds: No murmur heard. Pulmonary:     Effort: Pulmonary effort is normal. No respiratory distress.     Breath sounds: No rales.  Chest:     Chest wall: No tenderness.  Abdominal:     General: There is no distension.     Palpations: Abdomen is soft.     Tenderness: There is no abdominal tenderness.  Musculoskeletal:        General: Normal range of motion.     Cervical back: Normal range of motion and neck supple.  Skin:    General: Skin is warm and dry.     Findings: No erythema.  Neurological:     Mental Status: She is alert and oriented to person, place, and time.     Cranial Nerves: No cranial nerve deficit.     Motor: No abnormal muscle tone.     Coordination: Coordination normal.  Psychiatric:        Mood and Affect: Mood and affect normal.    There is no significant bruising on her lower extremity currently Below pictures wereprovided by patient         LABORATORY DATA:  I have reviewed the data as listed     Latest Ref Rng & Units 04/26/2022   10:19 AM 04/07/2022    8:33 AM 11/15/2018   12:00 AM  CBC  WBC 4.0 - 10.5 K/uL 9.0  5.7  13.0   Hemoglobin 12.0 - 15.0 g/dL 28.0  03.4  91.7   Hematocrit 36.0 - 46.0 % 42.9  42.6  41.8   Platelets 150 - 400 K/uL 314  369  310       Latest Ref Rng & Units 04/18/2022    8:39 AM 11/15/2018   12:00 AM 07/30/2015   11:59 AM  CMP  Glucose 70 - 99 mg/dL 89  90  91   BUN 6 - 20 mg/dL 10  8  8    Creatinine 0.57 - 1.00 mg/dL  9.15  0.56   Sodium 134 - 144 mmol/L 140  137  140   Potassium 3.5 - 5.2 mmol/L 4.3  4.0  4.5   Chloride 96 - 106 mmol/L 102  98  100   CO2 20 - 29 mmol/L 24  23  26    Calcium 8.7 - 10.2 mg/dL 9.79  9.5  9.6   Total Protein 6.0 - 8.5 g/dL 7.6  7.2  7.6   Total Bilirubin 0.0 - 1.2 mg/dL 0.4  0.7  0.5   Alkaline Phos 44 - 121 IU/L 76  78  78   AST 0 - 40 IU/L 13  14  17    ALT 0 - 32 IU/L 13  7  9      RADIOGRAPHIC STUDIES: I have personally reviewed the radiological images as listed and agreed with the findings in the report. No results found.

## 2022-04-27 ENCOUNTER — Telehealth: Payer: Self-pay | Admitting: Physician Assistant

## 2022-04-27 LAB — ANCA TITERS
Atypical P-ANCA titer: <1:20 {titer}
C-ANCA: <1:20 {titer}
P-ANCA: <1:20 {titer}

## 2022-04-27 NOTE — Telephone Encounter (Signed)
Patient called in wants to discuss lab results she received.

## 2022-04-28 LAB — ANTINUCLEAR ANTIBODIES, IFA: ANA Ab, IFA: NEGATIVE

## 2022-04-28 LAB — MISC LABCORP TEST (SEND OUT)
LabCorp test name: 1805
Labcorp test code: 1805

## 2022-04-28 NOTE — Telephone Encounter (Signed)
Called pt back and left message; she was wondering about results from hematology.  All look WNL to me. Hematology will likely contact about results as they were just drawn 04/26/22

## 2022-06-09 DIAGNOSIS — Z3492 Encounter for supervision of normal pregnancy, unspecified, second trimester: Secondary | ICD-10-CM | POA: Insufficient documentation

## 2022-07-04 LAB — OB RESULTS CONSOLE HEPATITIS B SURFACE ANTIGEN: Hepatitis B Surface Ag: NEGATIVE

## 2022-07-04 LAB — OB RESULTS CONSOLE RUBELLA ANTIBODY, IGM: Rubella: IMMUNE

## 2022-07-04 LAB — OB RESULTS CONSOLE VARICELLA ZOSTER ANTIBODY, IGG: Varicella: NON-IMMUNE/NOT IMMUNE

## 2022-08-26 ENCOUNTER — Encounter: Payer: Self-pay | Admitting: Physician Assistant

## 2022-08-26 ENCOUNTER — Ambulatory Visit (INDEPENDENT_AMBULATORY_CARE_PROVIDER_SITE_OTHER): Payer: Medicaid Other | Admitting: Physician Assistant

## 2022-08-26 VITALS — BP 105/62 | HR 71 | Resp 16 | Wt 175.0 lb

## 2022-08-26 DIAGNOSIS — R300 Dysuria: Secondary | ICD-10-CM

## 2022-08-26 DIAGNOSIS — N3000 Acute cystitis without hematuria: Secondary | ICD-10-CM | POA: Diagnosis not present

## 2022-08-26 LAB — POCT URINALYSIS DIPSTICK
Bilirubin, UA: NEGATIVE
Blood, UA: NEGATIVE
Glucose, UA: NEGATIVE
Ketones, UA: NEGATIVE
Nitrite, UA: NEGATIVE
Protein, UA: POSITIVE — AB
Spec Grav, UA: 1.01 (ref 1.010–1.025)
Urobilinogen, UA: 0.2 E.U./dL
pH, UA: 7 (ref 5.0–8.0)

## 2022-08-26 MED ORDER — CEPHALEXIN 250 MG PO CAPS: 250.0000 mg | ORAL_CAPSULE | Freq: Four times a day (QID) | ORAL | 0 refills | Status: AC

## 2022-08-26 NOTE — Progress Notes (Signed)
I,Cheryl Morgan,acting as a Neurosurgeon for [employer], PA-C.,have documented all relevant documentation on the behalf of Cheryl Ferguson, PA-C,as directed by  Cheryl Ferguson, PA-C while in the presence of Cheryl Ferguson, PA-C.   Established patient visit   Patient: Cheryl Morgan   DOB: 05-28-1996   26 y.o. Female  MRN: [id number] Visit Date: 08/26/2022  Today's healthcare provider: Alfredia Ferguson, PA-C   Chief Complaint  Patient presents with   Dysuria   Subjective    HPI  Cheryl Morgan is a 26 y/o female who is 73 w 6d pregnant with symptoms of burning during urination x 2-3 days. Reports urinary frequency, but this has been present since she has been pregnant. Reports taking azo intermittently.  Medications: Outpatient Medications Prior to Visit  Medication Sig   acetaminophen (TYLENOL 8 HOUR) 650 MG CR tablet Take 1 tablet (650 mg total) by mouth every 8 (eight) hours as needed for pain.   omeprazole (PRILOSEC) 20 MG capsule Take 1 capsule (20 mg total) by mouth daily.   triamcinolone ointment (KENALOG) 0.5 % Apply 1 Application topically 2 (two) times daily. Use of pea size amount to area of irritation for max of 7 days   No facility-administered medications prior to visit.    Review of Systems  Constitutional:  Negative for appetite change, chills, fatigue and fever.  Respiratory:  Negative for chest tightness and shortness of breath.   Cardiovascular:  Negative for chest pain and palpitations.  Gastrointestinal:  Negative for abdominal pain, nausea and vomiting.  Neurological:  Negative for dizziness and weakness.     Objective    BP 105/62 (BP Location: Left Arm, Patient Position: Sitting, Cuff Size: Normal)   Pulse 71   Resp 16   Wt 175 lb (79.4 kg)   SpO2 99%   BMI 27.41 kg/m    Physical Exam Vitals reviewed.  Constitutional:      Appearance: She is not ill-appearing.  HENT:     Head: Normocephalic.  Eyes:     Conjunctiva/sclera: Conjunctivae normal.   Cardiovascular:     Rate and Rhythm: Normal rate.  Pulmonary:     Effort: Pulmonary effort is normal. No respiratory distress.  Abdominal:     General: Abdomen is flat. There is no distension.     Palpations: Abdomen is soft. There is no mass.     Tenderness: There is no abdominal tenderness. There is no right CVA tenderness or left CVA tenderness.  Neurological:     General: No focal deficit present.     Mental Status: She is alert and oriented to person, place, and time.  Psychiatric:        Mood and Affect: Mood normal.        Behavior: Behavior normal.      Results for orders placed or performed in visit on 08/26/22  POCT Urinalysis Dipstick  Result Value Ref Range   Color, UA Yellow    Clarity, UA Cloudy    Glucose, UA Negative Negative   Bilirubin, UA Negative    Ketones, UA Negative    Spec Grav, UA 1.010 1.010 - 1.025   Blood, UA Negative    pH, UA 7.0 5.0 - 8.0   Protein, UA Positive (A) Negative   Urobilinogen, UA 0.2 0.2 or 1.0 E.U./dL   Nitrite, UA Negative    Leukocytes, UA Small (1+) (A) Negative    Assessment & Plan     Acute cystitis UA + leuks, culture ordered Rx  keflex 250 mg qid x 5 days Increase fluids  Return if symptoms worsen or fail to improve.      I, Cheryl Ferguson, PA-C have reviewed all documentation for this visit. The documentation on  08/26/2022 for the exam, diagnosis, procedures, and orders are all accurate and complete.  Cheryl Ferguson, PA-C [facility] [address] [place], [place], [address] Office: [phone] Fax: [phone]   [facility]

## 2022-08-29 DIAGNOSIS — Z3491 Encounter for supervision of normal pregnancy, unspecified, first trimester: Secondary | ICD-10-CM | POA: Diagnosis not present

## 2022-08-30 LAB — CULTURE, URINE COMPREHENSIVE

## 2022-09-01 DIAGNOSIS — R300 Dysuria: Secondary | ICD-10-CM | POA: Diagnosis not present

## 2022-09-01 DIAGNOSIS — N949 Unspecified condition associated with female genital organs and menstrual cycle: Secondary | ICD-10-CM | POA: Diagnosis not present

## 2022-09-01 DIAGNOSIS — O26892 Other specified pregnancy related conditions, second trimester: Secondary | ICD-10-CM | POA: Diagnosis not present

## 2022-09-07 DIAGNOSIS — N949 Unspecified condition associated with female genital organs and menstrual cycle: Secondary | ICD-10-CM | POA: Diagnosis not present

## 2022-09-07 DIAGNOSIS — O26892 Other specified pregnancy related conditions, second trimester: Secondary | ICD-10-CM | POA: Diagnosis not present

## 2022-09-07 DIAGNOSIS — R300 Dysuria: Secondary | ICD-10-CM | POA: Diagnosis not present

## 2022-09-23 ENCOUNTER — Encounter: Payer: Self-pay | Admitting: Obstetrics and Gynecology

## 2022-09-23 ENCOUNTER — Other Ambulatory Visit: Payer: Self-pay

## 2022-09-23 ENCOUNTER — Observation Stay
Admission: EM | Admit: 2022-09-23 | Discharge: 2022-09-24 | Disposition: A | Discharge status: Home / Self Care | Payer: Medicaid Other | Attending: Obstetrics and Gynecology | Admitting: Obstetrics and Gynecology

## 2022-09-23 DIAGNOSIS — O368120 Decreased fetal movements, second trimester, not applicable or unspecified: Principal | ICD-10-CM | POA: Insufficient documentation

## 2022-09-23 DIAGNOSIS — O368190 Decreased fetal movements, unspecified trimester, not applicable or unspecified: Secondary | ICD-10-CM | POA: Diagnosis present

## 2022-09-23 DIAGNOSIS — Z3A25 25 weeks gestation of pregnancy: Secondary | ICD-10-CM | POA: Insufficient documentation

## 2022-09-23 NOTE — OB Triage Note (Signed)
Patient is G1P0 and 24wks. Pt is primarily seen at [facility]. Was last seen on nov. 28 for urology concerns. Patient today is c/o decreased fetal movement. Pt spoke to Ulyses Southward earlier today about the concern. Patient came in to be evaluated since then. Pt denies any discharge or fluids leaking; denies abd pain. Baby HR found at this time was 159 in the room along with abundant fetal movement on fetal monitor.

## 2022-09-24 DIAGNOSIS — O368190 Decreased fetal movements, unspecified trimester, not applicable or unspecified: Secondary | ICD-10-CM | POA: Diagnosis present

## 2022-09-24 DIAGNOSIS — Z3A25 25 weeks gestation of pregnancy: Secondary | ICD-10-CM | POA: Diagnosis not present

## 2022-09-24 DIAGNOSIS — O368120 Decreased fetal movements, second trimester, not applicable or unspecified: Secondary | ICD-10-CM | POA: Diagnosis not present

## 2022-09-24 NOTE — Discharge Summary (Signed)
Patient ID: Cheryl Morgan MRN: [id number] DOB/AGE: 14-Aug-1996 26 y.o.  Admit date: 09/23/2022 Discharge date: 09/24/2022  Admission Diagnoses: 26yo G1P0 at [redacted]w[redacted]d presents with decreased FM.  Discharge Diagnoses: RNST  Factors complicating pregnancy: 10/2019 LSIL pap; current NILM w/HPV Pos Nicotine use: Vaping Varicella NON-immune Breech   Prenatal Procedures: none  Consults: None  Significant Diagnostic Studies:  No results found for this or any previous visit (from the past 168 hour(s)).  Treatments: none  Hospital Course:  This is a 26 y.o. G1P0000 with IUP at [redacted]w[redacted]d seen for decreased FM.  No leaking of fluid, no bleeding and no contractions. She was observed, fetal heart rate monitoring remained reassuring with a reactive NST, and she had no signs/symptoms of labor or other maternal-fetal concerns.  She was deemed stable for discharge to home with outpatient follow up.  Discharge Physical Exam:  BP 95/69 (BP Location: Left Arm)   Pulse 95   Temp 98.6 F (37 C) (Oral)   LMP  (LMP Unknown)   General: NAD CV: RRR Pulm: CTABL, nl effort ABD: s/nd/nt, gravid DVT Evaluation: LE non-ttp, no evidence of DVT on exam.  FHR baseline: 162 bpm Variability: moderate   TOCO: quiet SVE: deferred      Discharge Condition: Stable  Disposition: Discharge disposition: 01-Home or Self Care        Allergies as of 09/24/2022   No Known Allergies      Medication List     TAKE these medications    acetaminophen 650 MG CR tablet Commonly known as: Tylenol 8 Hour Take 1 tablet (650 mg total) by mouth every 8 (eight) hours as needed for pain.   omeprazole 20 MG capsule Commonly known as: PRILOSEC Take 1 capsule (20 mg total) by mouth daily.   triamcinolone ointment 0.5 % Commonly known as: KENALOG Apply 1 Application topically 2 (two) times daily. Use of pea size amount to area of irritation for max of 7 days         Signed:  Haroldine Laws,  CNM 09/24/2022 7:42 AM

## 2022-09-26 DIAGNOSIS — O09899 Supervision of other high risk pregnancies, unspecified trimester: Secondary | ICD-10-CM

## 2022-10-17 NOTE — L&D Delivery Note (Signed)
Delivery Note  Cheryl Morgan is a G1P0000 at [redacted]w[redacted]d, Patient's last menstrual period was 04/02/2022 (exact date)., consistent with [place] at [redacted]w[redacted]d. Estimated Date of Delivery: 01/07/23   First Stage: Labor onset: 0800 Induction: misoprostol Analgesia /Anesthesia intrapartum: Epidural AROM at 1230 GBS: positive IP Antibiotics: penicillin x 2 doses   Second Stage: Complete dilation at 1232 Onset of pushing at 1325 FHR second stage 140 bpm with moderate, variables decels with pushing   Cheryl Morgan presented to L&D for a scheduled induction of labor. Misoprostol was initially used for cervical ripening. She progressed well after the 2nd dose and received an epidural.  Cheryl Morgan quickly changed to C/C/+2 with an urge to push.  She pushed effectively over approximately 20 minutes for a spontaneous vaginal birth.  Delivery of a viable baby girl on 01/02/2023 at 1343 by CNM Delivery of fetal head in OA position with restitution to LOT. No nuchal cord;  Anterior then posterior shoulders delivered easily with gentle downward traction. Baby placed on mom's chest, and attended to by baby RN Cord double clamped after cessation of pulsation, cut by cousin  Cord blood sample collection: Not Indicated AB POS Performed at [facility], [address] [place], [place] [address]  Third Stage: Oxytocin bolus started after delivery of infant for hemorrhage prophylaxis  Placenta delivered via Delena Bali mechanism, intact with 3 VC @ 1349 Placenta disposition: discarded Uterine tone initially boggy with moderate bleeding.  Methergine 0.2 mg given IM in addition to Oxytocin bolus.  Uterine tone improved to firm and bleeding small.   1st vaginal laceration identified  Anesthesia for repair: epidural Repair with 2-0 vicryl CT in usual fashion Est. Blood Loss (mL): [id number] ml  Complications: None  Mom to postpartum.  Baby to Couplet care / Skin to Skin.  Newborn: Information for the patient's newborn:   Gertude, Wecker [id number]  Live born female "Cheryl Morgan" Birth Weight: 7 lb 1.2 oz (3210 g) APGAR: 8, 9  Newborn Delivery   Birth date/time: 01/02/2023 13:43:00 Delivery type: Vaginal, Spontaneous       Feeding planned: formula feeding and expressed breast milk  ---------- Drinda Butts, CNM Certified Nurse Midwife [facility]

## 2022-10-26 DIAGNOSIS — Z3403 Encounter for supervision of normal first pregnancy, third trimester: Secondary | ICD-10-CM | POA: Diagnosis not present

## 2022-10-26 DIAGNOSIS — E559 Vitamin D deficiency, unspecified: Secondary | ICD-10-CM | POA: Diagnosis not present

## 2022-11-03 ENCOUNTER — Encounter: Payer: Self-pay | Admitting: Physician Assistant

## 2022-11-03 ENCOUNTER — Ambulatory Visit (INDEPENDENT_AMBULATORY_CARE_PROVIDER_SITE_OTHER): Payer: Medicaid Other | Admitting: Physician Assistant

## 2022-11-03 VITALS — BP 105/76 | HR 91 | Temp 98.8°F | Ht 67.0 in | Wt 188.0 lb

## 2022-11-03 DIAGNOSIS — J069 Acute upper respiratory infection, unspecified: Secondary | ICD-10-CM

## 2022-11-03 DIAGNOSIS — R0602 Shortness of breath: Secondary | ICD-10-CM

## 2022-11-03 DIAGNOSIS — R051 Acute cough: Secondary | ICD-10-CM | POA: Diagnosis not present

## 2022-11-03 DIAGNOSIS — Z331 Pregnant state, incidental: Secondary | ICD-10-CM | POA: Diagnosis not present

## 2022-11-03 LAB — POCT INFLUENZA A/B
Influenza A, POC: NEGATIVE
Influenza B, POC: NEGATIVE

## 2022-11-03 LAB — POC COVID19 BINAXNOW: SARS Coronavirus 2 Ag: NEGATIVE

## 2022-11-03 MED ORDER — TRIAMCINOLONE ACETONIDE 55 MCG/ACT NA AERO: 2.0000 | INHALATION_SPRAY | Freq: Every day | NASAL | 2 refills | Status: DC

## 2022-11-03 NOTE — Progress Notes (Signed)
Established patient visit   Patient: Cheryl Morgan   DOB: Nov 18, 1995   27 y.o. Female  MRN: [id number] Visit Date: 11/03/2022  Today's healthcare provider: Mikey Kirschner, PA-C   Cc. Cough, congestion x 4 days  Subjective    HPI  Malik is a 28 y/o female currently [redacted] weeks pregnant with symptoms of nasal congestion, sinus pressure, chest tightness, cough x 4 days. Reports significant mucous production worsening heartburn. She has been taking famotidine 20 mg bid. Reports nausea and vomiting from mucous, last episode was yesterday afternoon.  She has been taking advil cold and sinus bid. Reports some lightheadedness when going from sitting to standing. Heart burn/ mucous, nausea and vomiting, last episode yesterday afternoon    Medications: Outpatient Medications Prior to Visit  Medication Sig   acetaminophen (TYLENOL 8 HOUR) 650 MG CR tablet Take 1 tablet (650 mg total) by mouth every 8 (eight) hours as needed for pain.   ascorbic acid (VITAMIN C) 500 MG tablet Take by mouth.   Cholecalciferol 50 MCG (2000 UT) TABS Take by mouth.   cyanocobalamin (VITAMIN B12) 1000 MCG tablet Take by mouth.   ferrous sulfate 325 (65 FE) MG EC tablet Take by mouth.   omeprazole (PRILOSEC) 20 MG capsule Take 1 capsule (20 mg total) by mouth daily.   Prenatal Vit-Fe Fumarate-FA (PRENATAL MULTIVITAMIN) TABS tablet Take 1 tablet by mouth daily at 12 noon.   triamcinolone ointment (KENALOG) 0.5 % Apply 1 Application topically 2 (two) times daily. Use of pea size amount to area of irritation for max of 7 days   No facility-administered medications prior to visit.    Review of Systems  Constitutional:  Negative for fatigue and fever.  HENT:  Positive for congestion and postnasal drip.   Respiratory:  Positive for cough and shortness of breath.   Cardiovascular:  Negative for chest pain and leg swelling.  Gastrointestinal:  Negative for abdominal pain.  Neurological:  Positive for  dizziness. Negative for headaches.       Objective    BP 105/76 (BP Location: Left Arm, Patient Position: Sitting, Cuff Size: Normal)   Pulse 91   Temp 98.8 F (37.1 C)   Ht 5\' 7"  (1.702 m)   Wt 188 lb (85.3 kg)   LMP  (LMP Unknown)   SpO2 98%   BMI 29.44 kg/m    Physical Exam Constitutional:      General: She is awake.     Appearance: She is well-developed.  HENT:     Head: Normocephalic.  Eyes:     Conjunctiva/sclera: Conjunctivae normal.  Cardiovascular:     Rate and Rhythm: Normal rate and regular rhythm.     Heart sounds: Normal heart sounds.  Pulmonary:     Effort: Pulmonary effort is normal.     Breath sounds: Normal breath sounds. No wheezing, rhonchi or rales.  Skin:    General: Skin is warm.  Neurological:     Mental Status: She is alert and oriented to person, place, and time.  Psychiatric:        Attention and Perception: Attention normal.        Mood and Affect: Mood normal.        Speech: Speech normal.        Behavior: Behavior is cooperative.     Results for orders placed or performed in visit on 11/03/22  POC COVID-19  Result Value Ref Range   SARS Coronavirus 2 Ag Negative Negative  POCT Influenza A/B  Result Value Ref Range   Influenza A, POC Negative Negative   Influenza B, POC Negative Negative    Assessment & Plan     URI Poc covid and poc flu a/b negative Increase fluids, rx nasacort nasal spray, encouraged sinus lavage/rinse.  Continue famotidine otc Advised switching from advil cold and sinus to regular mucinex. phenylephrine may be causing some lightheadedness.  If any worsening of symptoms please return/call office  Return if symptoms worsen or fail to improve.     I, Mikey Kirschner, PA-C have reviewed all documentation for this visit. The documentation on  11/03/22  for the exam, diagnosis, procedures, and orders are all accurate and complete.  Mikey Kirschner, PA-C [facility] [address]  [address] [place], [place], [address] Office: [phone] Fax: [place]

## 2022-11-08 DIAGNOSIS — R300 Dysuria: Secondary | ICD-10-CM | POA: Diagnosis not present

## 2022-12-13 DIAGNOSIS — Z3A36 36 weeks gestation of pregnancy: Secondary | ICD-10-CM | POA: Diagnosis not present

## 2022-12-13 DIAGNOSIS — Z3493 Encounter for supervision of normal pregnancy, unspecified, third trimester: Secondary | ICD-10-CM | POA: Diagnosis not present

## 2022-12-13 LAB — OB RESULTS CONSOLE GC/CHLAMYDIA
Chlamydia: NEGATIVE
Neisseria Gonorrhea: NEGATIVE

## 2022-12-13 LAB — OB RESULTS CONSOLE HIV ANTIBODY (ROUTINE TESTING): HIV: NONREACTIVE

## 2022-12-13 LAB — OB RESULTS CONSOLE RPR: RPR: NONREACTIVE

## 2022-12-23 DIAGNOSIS — O99333 Smoking (tobacco) complicating pregnancy, third trimester: Secondary | ICD-10-CM | POA: Diagnosis not present

## 2022-12-28 ENCOUNTER — Other Ambulatory Visit: Payer: Self-pay | Admitting: Obstetrics and Gynecology

## 2022-12-28 DIAGNOSIS — F17290 Nicotine dependence, other tobacco product, uncomplicated: Secondary | ICD-10-CM | POA: Diagnosis not present

## 2022-12-28 DIAGNOSIS — O0993 Supervision of high risk pregnancy, unspecified, third trimester: Secondary | ICD-10-CM | POA: Diagnosis not present

## 2022-12-28 DIAGNOSIS — Z3490 Encounter for supervision of normal pregnancy, unspecified, unspecified trimester: Secondary | ICD-10-CM

## 2022-12-28 DIAGNOSIS — O99333 Smoking (tobacco) complicating pregnancy, third trimester: Secondary | ICD-10-CM | POA: Diagnosis not present

## 2023-01-02 ENCOUNTER — Inpatient Hospital Stay
Admission: EM | Admit: 2023-01-02 | Discharge: 2023-01-04 | DRG: 806 | Disposition: A | Discharge status: Home / Self Care | Payer: Medicaid Other

## 2023-01-02 ENCOUNTER — Other Ambulatory Visit: Payer: Self-pay

## 2023-01-02 ENCOUNTER — Inpatient Hospital Stay: Payer: Medicaid Other | Admitting: Anesthesiology

## 2023-01-02 ENCOUNTER — Encounter: Payer: Self-pay | Admitting: Obstetrics and Gynecology

## 2023-01-02 DIAGNOSIS — O99824 Streptococcus B carrier state complicating childbirth: Secondary | ICD-10-CM | POA: Diagnosis present

## 2023-01-02 DIAGNOSIS — D649 Anemia, unspecified: Secondary | ICD-10-CM | POA: Diagnosis not present

## 2023-01-02 DIAGNOSIS — O99334 Smoking (tobacco) complicating childbirth: Principal | ICD-10-CM | POA: Diagnosis present

## 2023-01-02 DIAGNOSIS — O9852 Other viral diseases complicating childbirth: Secondary | ICD-10-CM | POA: Diagnosis present

## 2023-01-02 DIAGNOSIS — D62 Acute posthemorrhagic anemia: Secondary | ICD-10-CM | POA: Diagnosis not present

## 2023-01-02 DIAGNOSIS — B951 Streptococcus, group B, as the cause of diseases classified elsewhere: Secondary | ICD-10-CM | POA: Diagnosis present

## 2023-01-02 DIAGNOSIS — O99019 Anemia complicating pregnancy, unspecified trimester: Secondary | ICD-10-CM | POA: Diagnosis present

## 2023-01-02 DIAGNOSIS — O09899 Supervision of other high risk pregnancies, unspecified trimester: Secondary | ICD-10-CM

## 2023-01-02 DIAGNOSIS — Z8616 Personal history of COVID-19: Secondary | ICD-10-CM | POA: Diagnosis not present

## 2023-01-02 DIAGNOSIS — Z0000 Encounter for general adult medical examination without abnormal findings: Secondary | ICD-10-CM | POA: Diagnosis present

## 2023-01-02 DIAGNOSIS — Z3490 Encounter for supervision of normal pregnancy, unspecified, unspecified trimester: Principal | ICD-10-CM

## 2023-01-02 DIAGNOSIS — Z3A39 39 weeks gestation of pregnancy: Secondary | ICD-10-CM | POA: Diagnosis not present

## 2023-01-02 DIAGNOSIS — O9902 Anemia complicating childbirth: Secondary | ICD-10-CM | POA: Diagnosis not present

## 2023-01-02 DIAGNOSIS — B002 Herpesviral gingivostomatitis and pharyngotonsillitis: Secondary | ICD-10-CM | POA: Diagnosis not present

## 2023-01-02 DIAGNOSIS — F17290 Nicotine dependence, other tobacco product, uncomplicated: Secondary | ICD-10-CM | POA: Diagnosis present

## 2023-01-02 DIAGNOSIS — Z2839 Other underimmunization status: Secondary | ICD-10-CM

## 2023-01-02 HISTORY — DX: Hemorrhage of anus and rectum: K62.5

## 2023-01-02 HISTORY — DX: Anemia, unspecified: D64.9

## 2023-01-02 LAB — CBC
HCT: 34.4 % — ABNORMAL LOW (ref 36.0–46.0)
Hemoglobin: 12.2 g/dL (ref 12.0–15.0)
MCH: 34.3 pg — ABNORMAL HIGH (ref 26.0–34.0)
MCHC: 35.5 g/dL (ref 30.0–36.0)
MCV: 96.6 fL (ref 80.0–100.0)
Platelets: 218 10*3/uL (ref 150–400)
RBC: 3.56 MIL/uL — ABNORMAL LOW (ref 3.87–5.11)
RDW: 12 % (ref 11.5–15.5)
WBC: 11.2 10*3/uL — ABNORMAL HIGH (ref 4.0–10.5)
nRBC: 0 % (ref 0.0–0.2)

## 2023-01-02 LAB — TYPE AND SCREEN
ABO/RH(D): AB POS
Antibody Screen: NEGATIVE

## 2023-01-02 LAB — ABO/RH: ABO/RH(D): AB POS

## 2023-01-02 LAB — RPR: RPR Ser Ql: NONREACTIVE

## 2023-01-02 MED ORDER — MISOPROSTOL 25 MCG QUARTER TABLET
25.0000 ug | ORAL_TABLET | Freq: Once | ORAL | Status: AC
  Administered 2023-01-02: 25 ug via VAGINAL
  Filled 2023-01-02: qty 1

## 2023-01-02 MED ORDER — FENTANYL-BUPIVACAINE-NACL 0.5-0.125-0.9 MG/250ML-% EP SOLN
EPIDURAL | Status: AC
  Filled 2023-01-02: qty 250

## 2023-01-02 MED ORDER — PRENATAL MULTIVITAMIN CH
1.0000 | ORAL_TABLET | Freq: Every day | ORAL | Status: DC
  Administered 2023-01-03 – 2023-01-04 (×2): 1 via ORAL
  Filled 2023-01-02 (×2): qty 1

## 2023-01-02 MED ORDER — FENTANYL-BUPIVACAINE-NACL 0.5-0.125-0.9 MG/250ML-% EP SOLN
EPIDURAL | Status: DC | PRN
  Administered 2023-01-02: 12 mL/h via EPIDURAL

## 2023-01-02 MED ORDER — OXYTOCIN BOLUS FROM INFUSION
333.0000 mL | Freq: Once | INTRAVENOUS | Status: AC
  Administered 2023-01-02: 333 mL via INTRAVENOUS

## 2023-01-02 MED ORDER — OXYTOCIN-SODIUM CHLORIDE 30-0.9 UT/500ML-% IV SOLN
2.5000 [IU]/h | INTRAVENOUS | Status: DC
  Filled 2023-01-02: qty 500

## 2023-01-02 MED ORDER — SOD CITRATE-CITRIC ACID 500-334 MG/5ML PO SOLN: 30.0000 mL | ORAL | Status: DC | PRN

## 2023-01-02 MED ORDER — EPHEDRINE 5 MG/ML INJ: 10.0000 mg | INTRAVENOUS | Status: DC | PRN

## 2023-01-02 MED ORDER — TERBUTALINE SULFATE 1 MG/ML IJ SOLN: 0.2500 mg | Freq: Once | INTRAMUSCULAR | Status: DC | PRN

## 2023-01-02 MED ORDER — LACTATED RINGERS IV SOLN: 500.0000 mL | INTRAVENOUS | Status: DC | PRN

## 2023-01-02 MED ORDER — DIBUCAINE (PERIANAL) 1 % EX OINT
1.0000 | TOPICAL_OINTMENT | CUTANEOUS | Status: DC | PRN
  Administered 2023-01-02: 1 via RECTAL
  Filled 2023-01-02: qty 28

## 2023-01-02 MED ORDER — LIDOCAINE HCL (PF) 1 % IJ SOLN
30.0000 mL | INTRAMUSCULAR | Status: DC | PRN
  Filled 2023-01-02: qty 30

## 2023-01-02 MED ORDER — LACTATED RINGERS IV SOLN: INTRAVENOUS | Status: DC

## 2023-01-02 MED ORDER — OXYCODONE-ACETAMINOPHEN 5-325 MG PO TABS: 1.0000 | ORAL_TABLET | ORAL | Status: DC | PRN

## 2023-01-02 MED ORDER — SODIUM CHLORIDE 0.9% FLUSH: 3.0000 mL | Freq: Two times a day (BID) | INTRAVENOUS | Status: DC

## 2023-01-02 MED ORDER — SODIUM CHLORIDE 0.9% FLUSH: 3.0000 mL | INTRAVENOUS | Status: DC | PRN

## 2023-01-02 MED ORDER — MISOPROSTOL 200 MCG PO TABS
ORAL_TABLET | ORAL | Status: AC
  Filled 2023-01-02: qty 4

## 2023-01-02 MED ORDER — SIMETHICONE 80 MG PO CHEW: 80.0000 mg | CHEWABLE_TABLET | ORAL | Status: DC | PRN

## 2023-01-02 MED ORDER — METHYLERGONOVINE MALEATE 0.2 MG/ML IJ SOLN
0.2000 mg | Freq: Once | INTRAMUSCULAR | Status: AC
  Administered 2023-01-02: 0.2 mg via INTRAMUSCULAR

## 2023-01-02 MED ORDER — AMMONIA AROMATIC IN INHA
RESPIRATORY_TRACT | Status: AC
  Filled 2023-01-02: qty 10

## 2023-01-02 MED ORDER — SODIUM CHLORIDE 0.9 % IV SOLN
5.0000 10*6.[IU] | Freq: Once | INTRAVENOUS | Status: AC
  Administered 2023-01-02: 5 10*6.[IU] via INTRAVENOUS
  Filled 2023-01-02: qty 5

## 2023-01-02 MED ORDER — FENTANYL CITRATE (PF) 100 MCG/2ML IJ SOLN: 50.0000 ug | INTRAMUSCULAR | Status: DC | PRN

## 2023-01-02 MED ORDER — OXYCODONE-ACETAMINOPHEN 5-325 MG PO TABS: 2.0000 | ORAL_TABLET | ORAL | Status: DC | PRN

## 2023-01-02 MED ORDER — ACETAMINOPHEN 325 MG PO TABS: 650.0000 mg | ORAL_TABLET | ORAL | Status: DC | PRN

## 2023-01-02 MED ORDER — PHENYLEPHRINE 80 MCG/ML (10ML) SYRINGE FOR IV PUSH (FOR BLOOD PRESSURE SUPPORT): 80.0000 ug | PREFILLED_SYRINGE | INTRAVENOUS | Status: DC | PRN

## 2023-01-02 MED ORDER — FENTANYL-BUPIVACAINE-NACL 0.5-0.125-0.9 MG/250ML-% EP SOLN: 12.0000 mL/h | EPIDURAL | Status: DC | PRN

## 2023-01-02 MED ORDER — MISOPROSTOL 25 MCG QUARTER TABLET
25.0000 ug | ORAL_TABLET | Freq: Once | ORAL | Status: AC
  Administered 2023-01-02: 25 ug via ORAL
  Filled 2023-01-02: qty 1

## 2023-01-02 MED ORDER — BENZOCAINE-MENTHOL 20-0.5 % EX AERO
1.0000 | INHALATION_SPRAY | CUTANEOUS | Status: DC | PRN
  Administered 2023-01-02 – 2023-01-03 (×2): 1 via TOPICAL
  Filled 2023-01-02 (×2): qty 56

## 2023-01-02 MED ORDER — LIDOCAINE-EPINEPHRINE (PF) 1.5 %-1:200000 IJ SOLN
INTRAMUSCULAR | Status: DC | PRN
  Administered 2023-01-02: 3 mL via EPIDURAL

## 2023-01-02 MED ORDER — FERROUS SULFATE 325 (65 FE) MG PO TABS
325.0000 mg | ORAL_TABLET | Freq: Two times a day (BID) | ORAL | Status: DC
  Administered 2023-01-03 – 2023-01-04 (×3): 325 mg via ORAL
  Filled 2023-01-02 (×3): qty 1

## 2023-01-02 MED ORDER — METHYLERGONOVINE MALEATE 0.2 MG/ML IJ SOLN
INTRAMUSCULAR | Status: AC
  Filled 2023-01-02: qty 1

## 2023-01-02 MED ORDER — VARICELLA VIRUS VACCINE LIVE 1350 PFU/0.5ML IJ SUSR: 0.5000 mL | INTRAMUSCULAR | Status: DC | PRN

## 2023-01-02 MED ORDER — WITCH HAZEL-GLYCERIN EX PADS
1.0000 | MEDICATED_PAD | CUTANEOUS | Status: DC | PRN
  Administered 2023-01-02 – 2023-01-03 (×2): 1 via TOPICAL
  Filled 2023-01-02 (×2): qty 100

## 2023-01-02 MED ORDER — LIDOCAINE HCL (PF) 1 % IJ SOLN
INTRAMUSCULAR | Status: DC | PRN
  Administered 2023-01-02: 3 mL via SUBCUTANEOUS

## 2023-01-02 MED ORDER — DIPHENHYDRAMINE HCL 25 MG PO CAPS: 25.0000 mg | ORAL_CAPSULE | Freq: Four times a day (QID) | ORAL | Status: DC | PRN

## 2023-01-02 MED ORDER — DIPHENHYDRAMINE HCL 50 MG/ML IJ SOLN: 12.5000 mg | INTRAMUSCULAR | Status: DC | PRN

## 2023-01-02 MED ORDER — IBUPROFEN 600 MG PO TABS
600.0000 mg | ORAL_TABLET | Freq: Four times a day (QID) | ORAL | Status: DC
  Administered 2023-01-02 – 2023-01-04 (×8): 600 mg via ORAL
  Filled 2023-01-02 (×8): qty 1

## 2023-01-02 MED ORDER — LACTATED RINGERS IV SOLN
500.0000 mL | Freq: Once | INTRAVENOUS | Status: AC
  Administered 2023-01-02: 500 mL via INTRAVENOUS

## 2023-01-02 MED ORDER — PENICILLIN G POT IN DEXTROSE 60000 UNIT/ML IV SOLN
3.0000 10*6.[IU] | INTRAVENOUS | Status: DC
  Administered 2023-01-02: 3 10*6.[IU] via INTRAVENOUS
  Filled 2023-01-02 (×2): qty 50

## 2023-01-02 MED ORDER — ZOLPIDEM TARTRATE 5 MG PO TABS: 5.0000 mg | ORAL_TABLET | Freq: Every evening | ORAL | Status: DC | PRN

## 2023-01-02 MED ORDER — BUPIVACAINE HCL (PF) 0.25 % IJ SOLN
INTRAMUSCULAR | Status: DC | PRN
  Administered 2023-01-02 (×2): 4 mL via EPIDURAL

## 2023-01-02 MED ORDER — COCONUT OIL OIL: 1.0000 | TOPICAL_OIL | Status: DC | PRN

## 2023-01-02 MED ORDER — ONDANSETRON HCL 4 MG/2ML IJ SOLN
4.0000 mg | Freq: Four times a day (QID) | INTRAMUSCULAR | Status: DC | PRN
  Administered 2023-01-02: 4 mg via INTRAVENOUS
  Filled 2023-01-02: qty 2

## 2023-01-02 MED ORDER — OXYTOCIN 10 UNIT/ML IJ SOLN
INTRAMUSCULAR | Status: AC
  Filled 2023-01-02: qty 2

## 2023-01-02 MED ORDER — ONDANSETRON HCL 4 MG/2ML IJ SOLN: 4.0000 mg | INTRAMUSCULAR | Status: DC | PRN

## 2023-01-02 MED ORDER — SODIUM CHLORIDE 0.9 % IV SOLN: 250.0000 mL | INTRAVENOUS | Status: DC | PRN

## 2023-01-02 MED ORDER — MISOPROSTOL 25 MCG QUARTER TABLET
25.0000 ug | ORAL_TABLET | ORAL | Status: DC | PRN
  Administered 2023-01-02: 25 ug via VAGINAL
  Filled 2023-01-02 (×2): qty 1

## 2023-01-02 MED ORDER — SENNOSIDES-DOCUSATE SODIUM 8.6-50 MG PO TABS
2.0000 | ORAL_TABLET | Freq: Every day | ORAL | Status: DC
  Administered 2023-01-03 – 2023-01-04 (×2): 2 via ORAL
  Filled 2023-01-02 (×2): qty 2

## 2023-01-02 MED ORDER — ONDANSETRON HCL 4 MG PO TABS: 4.0000 mg | ORAL_TABLET | ORAL | Status: DC | PRN

## 2023-01-02 MED ORDER — ACETAMINOPHEN 500 MG PO TABS
1000.0000 mg | ORAL_TABLET | Freq: Four times a day (QID) | ORAL | Status: DC
  Administered 2023-01-02 – 2023-01-04 (×8): 1000 mg via ORAL
  Filled 2023-01-02 (×8): qty 2

## 2023-01-02 NOTE — H&P (Signed)
OB History & Physical   History of Present Illness:   Chief Complaint: scheduled IOL for nicotine use in pregnancy   HPI:  Cheryl Morgan is a 27 y.o. G1P0000 female at [redacted]w[redacted]d, Patient's last menstrual period was 04/02/2022 (exact date)., consistent with [place] at [redacted]w[redacted]d, with Estimated Date of Delivery: 01/06/23.  She presents to L&D for scheduled IOL for exective at term and nicotine use in pregnancy   Reports active fetal movement  Contractions: every 2 to 3 minutes since misoprostol given LOF/SROM: denies  Vaginal bleeding: denies   Factors complicating pregnancy:  Nicotine use in pregnancy  Varicella non-immune Vitamin D and vitamin B12 deficiency  Maternal iron deficiency anemia  GBS positive   Patient Active Problem List   Diagnosis Date Noted   Encounter for elective induction of labor 01/02/2023   Anemia affecting pregnancy 01/02/2023   Positive GBS test 01/02/2023   Susceptible to varicella (non-immune), currently pregnant 09/26/2022   Encounter for supervision of low-risk pregnancy in second trimester 06/09/2022   Gastroesophageal reflux disease 04/18/2022   Nicotine dependence due to vaping tobacco product 04/07/2022   Dyspepsia    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   Herpes gingivostomatitis 07/02/2014    Prenatal Transfer Tool  Maternal Diabetes: No Genetic Screening: Normal Maternal Ultrasounds/Referrals: Normal Fetal Ultrasounds or other Referrals:  None Maternal Substance Abuse:  No Significant Maternal Medications:  None Significant Maternal Lab Results: Group B Strep positive  Maternal Medical History:   Past Medical History:  Diagnosis Date   Anemia    COVID-19 05/21/2020   Dysmenorrhea 08/01/2012   Excessive and frequent menstruation 08/01/2012   GERD (gastroesophageal reflux disease)    History of asthma 05/21/2020   History of pneumonia 05/21/2020   Motion sickness    circular motion   Rectal bleeding    Seasonal allergies    Weight  gain 08/01/2012    Past Surgical History:  Procedure Laterality Date   ESOPHAGOGASTRODUODENOSCOPY (EGD) WITH PROPOFOL N/A 01/02/2019   Procedure: ESOPHAGOGASTRODUODENOSCOPY (EGD) WITH BIOPSIES;  Surgeon: Lin Landsman, MD;  Location: [facility];  Service: Endoscopy;  Laterality: N/A;   FLEXIBLE SIGMOIDOSCOPY N/A 01/02/2019   Procedure: FLEXIBLE SIGMOIDOSCOPY;  Surgeon: Lin Landsman, MD;  Location: [facility];  Service: Endoscopy;  Laterality: N/A;   MOUTH SURGERY     tooth extraction   Skyla     Inserted 08-17-12    No Known Allergies  Prior to Admission medications   Medication Sig Start Date End Date Taking? Authorizing Provider  omeprazole (PRILOSEC) 20 MG capsule Take 1 capsule (20 mg total) by mouth daily. 04/18/22  Yes Mikey Kirschner, PA-C  Prenatal Vit-Fe Fumarate-FA (PRENATAL MULTIVITAMIN) TABS tablet Take 1 tablet by mouth daily at 12 noon.   Yes [provider]  triamcinolone (NASACORT) 55 MCG/ACT AERO nasal inhaler Place 2 sprays into the nose daily. 11/03/22  Yes Mikey Kirschner, PA-C  triamcinolone ointment (KENALOG) 0.5 % Apply 1 Application topically 2 (two) times daily. Use of pea size amount to area of irritation for max of 7 days Patient not taking: Reported on 01/02/2023 04/07/22   Gwyneth Sprout, FNP     Prenatal care site:  [facility]  OB History  Gravida Para Term Preterm AB Living  1 0 0 0 0 0  SAB IAB Ectopic Multiple Live Births  0 0 0 0 0    # Outcome Date GA Lbr Len/2nd Weight Sex Delivery Anes PTL Lv  1 Current  Social History: She  reports that she has been smoking e-cigarettes. She has never used smokeless tobacco. She reports current alcohol use of about 3.0 standard drinks of alcohol per week. She reports that she does not use drugs.  Family History: family history includes Depression in her maternal grandmother; Diabetes in her maternal grandmother; Heart attack in her maternal  grandfather; Hypertension in her maternal aunt and maternal grandmother.   Review of Systems: A full review of systems was performed and negative except as noted in the HPI.     Physical Exam:  Vital Signs: BP 118/83   Pulse 80   Temp 97.9 F (36.6 C) (Oral)   Resp 18   Ht 5\' 7"  (1.702 m)   Wt 90.7 kg   LMP 04/02/2022 (Exact Date)   SpO2 98%   BMI 31.32 kg/m   General: no acute distress.  HEENT: normocephalic, atraumatic Heart: regular rate & rhythm Lungs: normal respiratory effort Abdomen: soft, gravid, non-tender;  EFW: 7lbs  Pelvic:   External: Normal external female genitalia  Cervix: Dilation: 10 / Effacement (%): 100 / Station: Plus 2    Extremities: non-tender, symmetric, No edema bilaterally.  DTRs: 2+/2+  Neurologic: Alert & oriented x 3.    Results for orders placed or performed during the hospital encounter of 01/02/23 (from the past 24 hour(s))  CBC     Status: Abnormal   Collection Time: 01/02/23  1:24 AM  Result Value Ref Range   WBC 11.2 (H) 4.0 - 10.5 K/uL   RBC 3.56 (L) 3.87 - 5.11 MIL/uL   Hemoglobin 12.2 12.0 - 15.0 g/dL   HCT 34.4 (L) 36.0 - 46.0 %   MCV 96.6 80.0 - 100.0 fL   MCH 34.3 (H) 26.0 - 34.0 pg   MCHC 35.5 30.0 - 36.0 g/dL   RDW 12.0 11.5 - 15.5 %   Platelets 218 150 - 400 K/uL   nRBC 0.0 0.0 - 0.2 %  Type and screen     Status: None   Collection Time: 01/02/23  1:24 AM  Result Value Ref Range   ABO/RH(D) AB POS    Antibody Screen NEG    Sample Expiration      01/05/2023,2359 Performed at [facility], [address] [place], [place] [address]   RPR     Status: None   Collection Time: 01/02/23  1:24 AM  Result Value Ref Range   RPR Ser Ql NON REACTIVE NON REACTIVE  ABO/Rh     Status: None   Collection Time: 01/02/23  3:21 AM  Result Value Ref Range   ABO/RH(D)      AB POS Performed at [facility], [address] [place], [place] [address]     Pertinent Results:  Prenatal Labs: Blood type/Rh AB  POS Performed at [facility], [address] [place], [place] [address]    Antibody screen Negative    Rubella Immune (09/18 0000)   Varicella Not immune  RPR NON REACTIVE (03/18 0124)   HBsAg Negative (09/18 0000)  Hep C NR   HIV Non-reactive (02/27 0000)   GC neg  Chlamydia neg  Genetic screening Declined   1 hour GTT 92  3 hour GTT N/A  GBS Positive      FHT:  FHR: 135 bpm, variability: moderate,  accelerations:  Present,  decelerations:  Absent Category/reactivity:  Category I UC:   regular, every 2-3 minutes   Cephalic by Le[place] and SVE   No results found.  Assessment:  Cheryl Morgan is a 27 y.o. G1P0000 female at [redacted]w[redacted]d with elective IOL at term for nicotine use in pregnancy .   Plan:  1. Admit to Labor & Delivery - consents reviewed and obtained - Dr. Ouida Sills notified of admission and plan of care   2. Fetal Well being  - Fetal Tracing: cat 1 - Group B Streptococcus ppx indicated: GBS positive  - Presentation: cephalic confirmed by SVE   3. Routine OB: - Prenatal labs reviewed, as above - Rh positive - CBC, T&S, RPR on admit - Clear liquid diet , continuous IV fluids  4. Induction of labor  - Contractions monitored with external toco - Pelvis adequate for trial of labor  - S/P 2 doses of misoprostol - contracting well - Induction with oxytocin and AROM as appropriate  - Plan for  continuous fetal monitoring - Maternal pain control as desired; planning regional anesthesia - Anticipate vaginal delivery  5. Post Partum Planning: - Infant feeding: formula feeding and expressed breast milk - Contraception: oral progesterone-only contraceptive - Flu vaccine:  declined  - Tdap vaccine:  declined  - RSV vaccine:  Not indicated  [name], CNM 01/02/23 2:21 PM  Drinda Butts, CNM Certified Nurse Midwife [facility]

## 2023-01-02 NOTE — Progress Notes (Signed)
MAKYNZIE REDING is a 27 y.o. G1P0000 presenting to L&D for scheduled induction. I discussed induction with her and her support people, mom Darnelle Bos & cousin Coleraine and evaluated her understanding of the process. Rashema verbalized understanding of why she is being induced and agreed to start the process.   She reports positive fetal movement and denies vaginal bleeding, leaking of fluid consistent with ROM, and regular contractions.   We discussed her birth preferences, including an epidural & pushing on her side.   I provided her with education on what to expect with misoprostol.  Initial vital signs stable. Fetal monitors applied and education about EFM given. Initial FHT 145. Angelly & her family have been oriented to the care environment, including call bell and bed control use. I explained the admission packet, including birth certificate worksheet, Flavia Shipper Assessment, and feeding log.   Marshea is resting comfortably after a well tolerated cervical exam and misoprostol placement. Plan to encourage rest and reassess cervix in 4 hours.

## 2023-01-02 NOTE — Anesthesia Procedure Notes (Signed)
Epidural Patient location during procedure: OB Start time: 01/02/2023 9:08 AM End time: 01/02/2023 9:22 AM  Staffing Anesthesiologist: Piscitello, Precious Haws, MD Resident/CRNA: Aline Brochure, CRNA Performed: resident/CRNA   Preanesthetic Checklist Completed: patient identified, IV checked, site marked, risks and benefits discussed, surgical consent, monitors and equipment checked, pre-op evaluation and timeout performed  Epidural Patient position: sitting Prep: Betadine Patient monitoring: heart rate, continuous pulse ox and blood pressure Approach: midline Location: L4-L5 Injection technique: LOR air  Needle:  Needle type: Tuohy  Needle gauge: 17 G Needle length: 9 cm and 9 Needle insertion depth: 8 cm Catheter type: closed end flexible Catheter size: 19 Gauge Catheter at skin depth: 13 cm Test dose: negative and 1.5% lidocaine with Epi 1:200 K  Assessment Sensory level: T10 Events: blood not aspirated, no cerebrospinal fluid, injection not painful, no injection resistance, no paresthesia and negative IV test  Additional Notes Pt's history reviewed and consent obtained as per OB consent Patient tolerated the insertion well without complications. Negative SATD, negative IVTD All VSS were obtained and monitored through [place] and nursing protocols followed.Reason for block:procedure for pain

## 2023-01-02 NOTE — Progress Notes (Signed)
L&D Note    Subjective:  Feeling much more comfortable with epidural.  Denies pressure.  Objective:   Vitals:   01/02/23 0936 01/02/23 1000 01/02/23 1030 01/02/23 1045  BP:  108/74 106/78 (!) 111/53  Pulse:  68 83 99  Resp:      Temp:    97.9 F (36.6 C)  TempSrc:    Oral  SpO2: 96% 97% 97% 98%  Weight:      Height:        Current Vital Signs 24h Vital Sign Ranges  T 97.9 F (36.6 C) Temp  Avg: 98.2 F (36.8 C)  Min: 97.9 F (36.6 C)  Max: 98.6 F (37 C)  BP (!) 111/53 BP  Min: 100/51  Max: 130/82  HR 99 Pulse  Avg: 78.1  Min: 68  Max: 99  RR 18 Resp  Avg: 17.3  Min: 16  Max: 18  SaO2 98 %   SpO2  Avg: 96.7 %  Min: 96 %  Max: 98 %      Gen: alert, cooperative, no distress FHR: Baseline: 150 bpm, Variability: moderate, Accels: Present, Decels: variable and occasional prolong decel  Toco: regular, every 2-4 minutes SVE: Dilation: 10 Dilation Complete Date: 01/02/23 Dilation Complete Time: 1232 Effacement (%): 100 Cervical Position: Middle Station: Plus 2 Presentation: Vertex Exam by:: Cheryl Morgan CNM  Medications SCHEDULED MEDICATIONS   ammonia       misoprostol       oxytocin       oxytocin 40 units in LR 1000 mL  333 mL Intravenous Once    MEDICATION INFUSIONS   fentaNYL 2 mcg/mL w/bupivacaine 0.125% in NS 250 mL     lactated ringers     lactated ringers 125 mL/hr at 01/02/23 [id number]   oxytocin     pencillin G potassium IV      PRN MEDICATIONS  acetaminophen, ammonia, diphenhydrAMINE, ePHEDrine, ePHEDrine, fentaNYL (SUBLIMAZE) injection, fentaNYL 2 mcg/mL w/bupivacaine 0.125% in NS 250 mL, lactated ringers, lidocaine (PF), misoprostol, misoprostol, ondansetron, oxyCODONE-acetaminophen, oxyCODONE-acetaminophen, oxytocin, phenylephrine, phenylephrine, sodium citrate-citric acid, terbutaline   Assessment & Plan:  27 y.o. G1P0000 at [redacted]w[redacted]d admitted for IOL for nicotine use in pregnancy  -Labor: Active phase labor -> now 2nd stage  -Fetal Well-being: Category II -  overall reassuring  -GBS: positive - PCN x 1 dose - due now for 2nd dose  -Membranes ruptured - AROM at 1233 -Continue present management.  Will give 2nd dose of PCN now and then start pushing. -Analgesia: regional anesthesia   Cheryl Morgan, CNM  01/02/2023 12:37 PM  Cheryl Morgan

## 2023-01-02 NOTE — Discharge Instructions (Signed)
Vaginal Delivery, Care After Refer to this sheet in the next few weeks. These discharge instructions provide you with information on caring for yourself after delivery. Your caregiver may also give you specific instructions. Your treatment has been planned according to the most current medical practices available, but problems sometimes occur. Call your caregiver if you have any problems or questions after you go home. HOME CARE INSTRUCTIONS Take over-the-counter or prescription medicines only as directed by your caregiver or pharmacist. Do not drink alcohol, especially if you are breastfeeding or taking medicine to relieve pain. Do not smoke tobacco. Continue to use good perineal care. Good perineal care includes: Wiping your perineum from back to front Keeping your perineum clean. You can do sitz baths twice a day, to help keep this area clean Do not use tampons, douche or have sex until your caregiver says it is okay. Shower only and avoid sitting in submerged water, aside from sitz baths Wear a well-fitting bra that provides breast support. Eat healthy foods. Drink enough fluids to keep your urine clear or pale yellow. Eat high-fiber foods such as whole grain cereals and breads, brown rice, beans, and fresh fruits and vegetables every day. These foods may help prevent or relieve constipation. Avoid constipation with high fiber foods or medications, such as miralax or metamucil Follow your caregiver's recommendations regarding resumption of activities such as climbing stairs, driving, lifting, exercising, or traveling. Talk to your caregiver about resuming sexual activities. Resumption of sexual activities is dependent upon your risk of infection, your rate of healing, and your comfort and desire to resume sexual activity. Try to have someone help you with your household activities and your newborn for at least a few days after you leave the hospital. Rest as much as possible. Try to rest or  take a nap when your newborn is sleeping. Increase your activities gradually. Keep all of your scheduled postpartum appointments. It is very important to keep your scheduled follow-up appointments. At these appointments, your caregiver will be checking to make sure that you are healing physically and emotionally. SEEK MEDICAL CARE IF:  You are passing large clots from your vagina. Save any clots to show your caregiver. You have a foul smelling discharge from your vagina. You have trouble urinating. You are urinating frequently. You have pain when you urinate. You have a change in your bowel movements. You have increasing redness, pain, or swelling near your vaginal incision (episiotomy) or vaginal tear. You have pus draining from your episiotomy or vaginal tear. Your episiotomy or vaginal tear is separating. You have painful, hard, or reddened breasts. You have a severe headache. You have blurred vision or see spots. You feel sad or depressed. You have thoughts of hurting yourself or your newborn. You have questions about your care, the care of your newborn, or medicines. You are dizzy or light-headed. You have a rash. You have nausea or vomiting. You were breastfeeding and have not had a menstrual period within 12 weeks after you stopped breastfeeding. You are not breastfeeding and have not had a menstrual period by the 12th week after delivery. You have a fever. SEEK IMMEDIATE MEDICAL CARE IF:  You have persistent pain. You have chest pain. You have shortness of breath. You faint. You have leg pain. You have stomach pain. Your vaginal bleeding saturates two or more sanitary pads in 1 hour. MAKE SURE YOU:  Understand these instructions. Will watch your condition. Will get help right away if you are not doing well or   get worse. Document Released: 09/30/2000 Document Revised: 02/17/2014 Document Reviewed: 05/30/2012 ExitCare Patient Information 2015 [employer]. This  information is not intended to replace advice given to you by your health care provider. Make sure you discuss any questions you have with your health care provider.  Sitz Bath A sitz bath is a warm water bath taken in the sitting position. The water covers only the hips and butt (buttocks). We recommend using one that fits in the toilet, to help with ease of use and cleanliness. It may be used for either healing or cleaning purposes. Sitz baths are also used to relieve pain, itching, or muscle tightening (spasms). The water may contain medicine. Moist heat will help you heal and relax.  HOME CARE  Take 3 to 4 sitz baths a day. Fill the bathtub half-full with warm water. Sit in the water and open the drain a little. Turn on the warm water to keep the tub half-full. Keep the water running constantly. Soak in the water for 15 to 20 minutes. After the sitz bath, pat the affected area dry. GET HELP RIGHT AWAY IF: You get worse instead of better. Stop the sitz baths if you get worse. MAKE SURE YOU: Understand these instructions. Will watch your condition. Will get help right away if you are not doing well or get worse. Document Released: 11/10/2004 Document Revised: 06/27/2012 Document Reviewed: 01/31/2011 ExitCare Patient Information 2015 [employer]. This information is not intended to replace advice given to you by your health care provider. Make sure you discuss any questions you have with your health care provider.     

## 2023-01-02 NOTE — Anesthesia Preprocedure Evaluation (Signed)
Anesthesia Evaluation  Patient identified by MRN, date of birth, ID band Patient awake    Reviewed: Allergy & Precautions, H&P , NPO status , Patient's Chart, lab work & pertinent test results  History of Anesthesia Complications Negative for: history of anesthetic complications  Airway Mallampati: II       Dental   Pulmonary Current Smoker and Patient abstained from smoking.          Cardiovascular      Neuro/Psych    GI/Hepatic ,GERD  Controlled,,  Endo/Other    Renal/GU      Musculoskeletal   Abdominal   Peds  Hematology  (+) Blood dyscrasia, anemia   Anesthesia Other Findings   Reproductive/Obstetrics (+) Pregnancy                             Anesthesia Physical Anesthesia Plan  ASA: 2  Anesthesia Plan: Epidural   Post-op Pain Management:    Induction:   PONV Risk Score and Plan:   Airway Management Planned:   Additional Equipment:   Intra-op Plan:   Post-operative Plan:   Informed Consent: I have reviewed the patients History and Physical, chart, labs and discussed the procedure including the risks, benefits and alternatives for the proposed anesthesia with the patient or authorized representative who has indicated his/her understanding and acceptance.       Plan Discussed with: Anesthesiologist  Anesthesia Plan Comments:        Anesthesia Quick Evaluation

## 2023-01-02 NOTE — Lactation Note (Signed)
This note was copied from a baby's chart. Lactation Consultation Note  Patient Name: Cheryl Morgan [id number] Date: 01/02/2023 Age:27 years Reason for consult: Initial assessment;Primapara;Term   Maternal Data Does the patient have breastfeeding experience prior to this delivery?: No  P1, SVD 4 hours ago. Multiple dx (see chart).  Mom voices desire to provide breastmilk to her baby through pumping only. Formula was given after delivery.  LC attempted to provide education and guidance on the importance of early and frequent breast stimulation for the promotion of milk production. Mom had several visitors in her room, and declined pump set-up at this time- she plans to "call out when I'm ready".   Pump kit labeled and placed at nurses station.  Feeding Mother's Current Feeding Choice: Breast Milk and Formula Nipple Type: Nfant Extra Slow Flow (gold)  LATCH Score   Lactation Tools Discussed/Used Tools: Pump (declined set-up at this visit) Reason for Pumping: Mom's choice  Interventions    Discharge    Consult Status Consult Status: Follow-up    Lavonia Drafts 01/02/2023, 6:04 PM

## 2023-01-02 NOTE — Discharge Summary (Signed)
Obstetrical Discharge Summary  Patient Name: Cheryl Morgan DOB: [birth year] MRN: [id number]  Date of Admission: 01/02/2023 Date of Delivery: 01/02/2023 Delivered by: Drinda Butts, CNM  Date of Discharge: 01/04/2023  Primary OB: [facility] [id number] last menstrual period was 04/02/2022 (exact date). EDC Estimated Date of Delivery: 01/07/23 Gestational Age at Delivery: [redacted]w[redacted]d   Antepartum complications:  Nicotine use in pregnancy  Varicella non-immune Vitamin D and vitamin B12 deficiency  Maternal iron deficiency anemia  GBS positive   Admitting Diagnosis: Encounter for elective induction of labor [Z34.90]  Secondary Diagnosis: Patient Active Problem List   Diagnosis Date Noted   Encounter for elective induction of labor 01/02/2023   Anemia affecting pregnancy 01/02/2023   Positive GBS test 01/02/2023   Susceptible to varicella (non-immune), currently pregnant 09/26/2022   Encounter for supervision of low-risk pregnancy in second trimester 06/09/2022   Gastroesophageal reflux disease 04/18/2022   Nicotine dependence due to vaping tobacco product 04/07/2022   Dyspepsia    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   Herpes gingivostomatitis 07/02/2014    Discharge Diagnosis: Term Pregnancy Delivered      Induction: Cytotec Complications: None Intrapartum complications/course: Cheryl Morgan presented to L&D for a scheduled induction of labor. Misoprostol was initially used for cervical ripening. She progressed well after the 2nd dose and received an epidural.  Cheryl Morgan quickly changed to C/C/+2 with an urge to push.  She pushed effectively over approximately 20 minutes for a spontaneous vaginal birth.  Delivery Type: spontaneous vaginal delivery Anesthesia: epidural anesthesia Placenta: spontaneous To Pathology: No  Laceration: 1st degree and vaginal - repaired with 2-0 vicryl CT  Episiotomy: none Newborn Data: Live born female "Cheryl Morgan" Birth Weight: 7 lb 1.2 oz (3210  g) APGAR: 8, 9  Newborn Delivery   Birth date/time: 01/02/2023 13:43:00 Delivery type: Vaginal, Spontaneous      Postpartum Procedures: none Edinburgh:     01/03/2023    6:10 AM  Cheryl Morgan Postnatal Depression Scale Screening Tool  I have been able to laugh and see the funny side of things. 1  I have looked forward with enjoyment to things. 1  I have blamed myself unnecessarily when things went wrong. 3  I have been anxious or worried for no good reason. 3  I have felt scared or panicky for no good reason. 2  Things have been getting on top of me. 2  I have been so unhappy that I have had difficulty sleeping. 1  I have felt sad or miserable. 1  I have been so unhappy that I have been crying. 1  The thought of harming myself has occurred to me. 1  Edinburgh Postnatal Depression Scale Total 16     Post partum course:  Patient had an uncomplicated postpartum course.  By time of discharge on PPD#2, her pain was controlled on oral pain medications; she had appropriate lochia and was ambulating, voiding without difficulty and tolerating regular diet.  Transition of care consult was completed d/t elevated EPDS score.  Will plan sooner return to office for postpartum mood check. She was deemed stable for discharge to home.    Discharge Physical Exam:  BP 128/83 (BP Location: Left Arm)   Pulse 72   Temp 97.9 F (36.6 C)   Resp 18   Ht 5\' 7"  (1.702 m)   Wt 90.7 kg   LMP 04/02/2022 (Exact Date)   SpO2 99%   Breastfeeding Unknown   BMI 31.32 kg/m   General: NAD CV: RRR Pulm: CTABL, nl effort  ABD: s/nd/nt, fundus firm and below the umbilicus Lochia: moderate Perineum: minimal edema/repair well approximated DVT Evaluation: LE non-ttp, no evidence of DVT on exam.  Hemoglobin  Date Value Ref Range Status  01/03/2023 10.9 (L) 12.0 - 15.0 g/dL Final  04/07/2022 14.8 11.1 - 15.9 g/dL Final   HCT  Date Value Ref Range Status  01/03/2023 31.1 (L) 36.0 - 46.0 % Final    Hematocrit  Date Value Ref Range Status  04/07/2022 42.6 34.0 - 46.6 % Final    Risk assessment for postpartum VTE and prophylactic treatment: Very high risk factors: None High risk factors: None Moderate risk factors: None  Postpartum VTE prophylaxis with LMWH not indicated  Disposition: stable, discharge to home. Baby Feeding: formula feeding and expressed breast milk Baby Disposition: home with mom  Rh Immune globulin indicated: No Rubella vaccine given: was not indicated Varivax vaccine given: was offered prior to discharge  Flu vaccine given in AP setting: declined  Tdap vaccine given in AP setting: declined   Contraception: oral progesterone-only contraceptive  Prenatal Labs:  Blood type/Rh AB POS Performed at [facility], [address] [place], [place] [address]    Antibody screen Negative    Rubella Immune (09/18 0000)   Varicella Not immune  RPR NON REACTIVE (03/18 0124)   HBsAg Negative (09/18 0000)  Hep C NR   HIV Non-reactive (02/27 0000)   GC neg  Chlamydia neg  Genetic screening Declined   1 hour GTT 92  3 hour GTT N/A  GBS Positive       Plan:  Cheryl Morgan was discharged to home in good condition. Follow-up appointment with delivering provider in 2 weeks.  Discharge Medications: Allergies as of 01/04/2023   No Known Allergies      Medication List     TAKE these medications    acetaminophen 500 MG tablet Commonly known as: TYLENOL Take 2 tablets (1,000 mg total) by mouth every 6 (six) hours.   ibuprofen 600 MG tablet Commonly known as: ADVIL Take 1 tablet (600 mg total) by mouth every 6 (six) hours as needed for mild pain or cramping.   omeprazole 20 MG capsule Commonly known as: PRILOSEC Take 1 capsule (20 mg total) by mouth daily.   prenatal multivitamin Tabs tablet Take 1 tablet by mouth daily at 12 noon.   triamcinolone 55 MCG/ACT Aero nasal inhaler Commonly known as: NASACORT Place 2 sprays into the  nose daily.   triamcinolone ointment 0.5 % Commonly known as: KENALOG Apply 1 Application topically 2 (two) times daily. Use of pea size amount to area of irritation for max of 7 days         Follow-up Information     [name], CNM. Schedule an appointment as soon as possible for a visit in 6 week(s).   Specialty: Certified Nurse Midwife Why: postpartum visit Contact information: [place] [address] [place], [place], CNM. Schedule an appointment as soon as possible for a visit in 2 week(s).   Specialty: Certified Nurse Midwife Why: postpartum mood check Contact information: [place] [address] [phone]                 Signed:  Drinda Butts, CNM Certified Nurse Midwife [facility]

## 2023-01-03 DIAGNOSIS — O9081 Anemia of the puerperium: Secondary | ICD-10-CM | POA: Diagnosis not present

## 2023-01-03 LAB — CBC
HCT: 31.1 % — ABNORMAL LOW (ref 36.0–46.0)
Hemoglobin: 10.9 g/dL — ABNORMAL LOW (ref 12.0–15.0)
MCH: 34.4 pg — ABNORMAL HIGH (ref 26.0–34.0)
MCHC: 35 g/dL (ref 30.0–36.0)
MCV: 98.1 fL (ref 80.0–100.0)
Platelets: 153 10*3/uL (ref 150–400)
RBC: 3.17 MIL/uL — ABNORMAL LOW (ref 3.87–5.11)
RDW: 12.1 % (ref 11.5–15.5)
WBC: 12 10*3/uL — ABNORMAL HIGH (ref 4.0–10.5)
nRBC: 0 % (ref 0.0–0.2)

## 2023-01-03 NOTE — Lactation Note (Signed)
This note was copied from a baby's chart. Lactation Consultation Note  Patient Name: Cheryl Morgan [id number] Date: 01/03/2023 Age:28 hours Reason for consult: (P) Follow-up assessment;Primapara   Maternal Data  P1 maternal patient SVD 3/18. Maternal history of anemia and depression. Follow-up after previously declined LC pump education. Anticipated discharge 3/21.  Has patient been taught Hand Expression?: Yes (Taught hand expression 3/20. Pt attempt observed with visible droplets expressed.) Does the patient have breastfeeding experience prior to this delivery?: No  Feeding Mother's Current Feeding Choice: (P) Breast Milk and Formula (Primarily formula feeding, pt would like to exclusively pump to offer expressed breast milk upon discharge) Nipple Type: Slow - flow  LATCH Score   Infant sleeping in bassinet upon [place] arrival, no feeding attempt observed.            Lactation Tools Discussed/Used Tools: Pump (Assisted with hospital DEBP set up, demonstrated hand pump and hand expression. Flange fit at 7mm. Observed pumping initiation. Provided education regarding pt's plan to exclusively pump at home following discharge.) Breast pump type: Double-Electric Breast Pump;Manual Discussed normative infant hunger cues and pumping schedule to mimic infant patterns. Advised to keep pumping sessions 15 minutes maximum. Education provided regarding transitional/mature milk and infant stomach size capacity.   Interventions Interventions: Breast feeding basics reviewed;Hand express;Expressed milk;Hand pump;Ice;Education;DEBP Engorgement and mastitis prevention education provided.  Consult Status   Follow up prior to discharge. Update provided to care nurse.    Rodena Medin 01/03/2023, 12:07 PM

## 2023-01-03 NOTE — Progress Notes (Signed)
Postpartum Day  1  Subjective: no complaints, up ad lib, voiding, tolerating PO, and + flatus  Doing well, no concerns. Ambulating without difficulty, pain managed with PO meds, tolerating regular diet, and voiding without difficulty.   No fever/chills, chest pain, shortness of breath, nausea/vomiting, or leg pain. No nipple or breast pain. No headache, visual changes, or RUQ/epigastric pain.  Objective: BP 135/79 (BP Location: Left Arm)   Pulse 67   Temp 98.3 F (36.8 C) (Oral)   Resp 18   Ht 5\' 7"  (1.702 m)   Wt 90.7 kg   LMP 04/02/2022 (Exact Date)   SpO2 99%   Breastfeeding Unknown   BMI 31.32 kg/m    Physical Exam:  General: alert, cooperative, and appears stated age Breasts: soft/nontender CV: RRR Pulm: nl effort, CTABL Abdomen: soft, non-tender, active bowel sounds Uterine Fundus: firm Perineum: minimal edema, intact Lochia: appropriate DVT Evaluation: No evidence of DVT seen on physical exam. Negative Homan's sign. No cords or calf tenderness. No significant calf/ankle edema.  Recent Labs    01/02/23 0124 01/03/23 0657  HGB 12.2 10.9*  HCT 34.4* 31.1*  WBC 11.2* 12.0*  PLT 218 153    Assessment/Plan: 27 y.o. G1P1001 postpartum day # 1  -Continue routine postpartum care -Lactation consult PRN for breastfeeding  -Discussed contraceptive options including implant, IUDs hormonal and non-hormonal, injection, pills/ring/patch, condoms, and NFP.  -Acute blood loss anemia - hemodynamically stable and asymptomatic; start PO ferrous sulfate BID with stool softeners  -Immunization status:   all immunizations up to date   Disposition: Continue inpatient postpartum care  LOS: 1 day     ----- Avelino Leeds Certified Nurse Midwife [facility]

## 2023-01-03 NOTE — Clinical Social Work Maternal (Signed)
  CLINICAL SOCIAL WORK MATERNAL/CHILD NOTE  Patient Details  Name: Cheryl Morgan MRN: [id number] Date of Birth: 10-17-1996  Date:  01/03/2023  Clinical Social Worker Initiating Note:  Kelby Fam, [place] Date/Time: Initiated:  01/03/23/1209     Child's Name:  Cheryl Morgan   Biological Parents:  Mother, Father   Need for Interpreter:  None   Reason for Referral:   (depression score 16)   Address:  [address] [place] [address]    Phone number:  [phone] (home)     Additional phone number:   Household Members/Support Persons (HM/SP):   Household Member/Support Person 1   HM/SP Name Relationship DOB or Age  HM/SP -1 Cheryl Morgan mother of MOB    HM/SP -2        HM/SP -3        HM/SP -4        HM/SP -5        HM/SP -6        HM/SP -7        HM/SP -8          Natural Supports (not living in the home):  Extended Family, Artist Supports:     Employment: Animator   Type of Work: [employer]   Education:      Homebound arranged:    Museum/gallery curator Resources:  Medicaid   Other Resources:      Cultural/Religious Considerations Which May Impact Care:    Strengths:  Ability to meet basic needs  , Home prepared for child     Psychotropic Medications:         Pediatrician:       Pediatrician List:   [place]      Pediatrician Fax Number:    Risk Factors/Current Problems:   (depression score)   Cognitive State:      Mood/Affect:  Calm     CSW Assessment:   CSW met with patient's mother Cheryl Morgan at bedside, patient was currently in shower. Child at bedside, patient's mother reports they named her Cheryl Morgan. They report that both patient's parents are involved as support persons for patient and child, FOB is not involved. Patient is employed at [employer], patient has hx of being on antidepressants and plans to follow up with PCP  following discharge. Reports Cheryl Morgan will go to [facility] for pediatric follow up. No questions or concerns at this time. Post Partum depression check list of symptoms left for patient at bedside, aware to follow up with PCP should she experience symptoms.   All cribs/bassinett and car seat are ready for patient and child for discharge.   No further discharge needs identified at this time.    CSW Plan/Description:  No Further Intervention Required/No Barriers to Discharge    Tiburcio Bash, LCSW 01/03/2023, 12:10 PM

## 2023-01-04 MED ORDER — IBUPROFEN 600 MG PO TABS: 600.0000 mg | ORAL_TABLET | Freq: Four times a day (QID) | ORAL | 0 refills | Status: DC | PRN

## 2023-01-04 MED ORDER — ACETAMINOPHEN 500 MG PO TABS: 1000.0000 mg | ORAL_TABLET | Freq: Four times a day (QID) | ORAL | 0 refills | Status: DC

## 2023-01-04 NOTE — Anesthesia Postprocedure Evaluation (Signed)
Anesthesia Post Note  Patient: Cheryl Morgan  Procedure(s) Performed: AN AD HOC LABOR EPIDURAL  Patient location during evaluation: Mother Baby Anesthesia Type: Epidural Level of consciousness: oriented and awake and alert Pain management: pain level controlled Vital Signs Assessment: post-procedure vital signs reviewed and stable Respiratory status: spontaneous breathing and respiratory function stable Cardiovascular status: blood pressure returned to baseline and stable Postop Assessment: no headache, no backache, no apparent nausea or vomiting and able to ambulate Anesthetic complications: no  No notable events documented.   Last Vitals:  Vitals:   01/03/23 2257 01/04/23 0700  BP: 103/68 128/83  Pulse: 67 (!) 55  Resp: 18 18  Temp: 36.6 C 36.6 C  SpO2: 99% 99%    Last Pain:  Vitals:   01/03/23 2340  TempSrc:   PainSc: 0-No pain                 Natasha Mead

## 2023-01-04 NOTE — Progress Notes (Signed)
Patient discharged home with family.  Discharge instructions, when to follow up, and prescriptions reviewed with patient.  Pt stated she will follow up with mental health care provider outpatient. Patient verbalized understanding. Patient will be escorted out by auxiliary.

## 2023-01-19 DIAGNOSIS — F53 Postpartum depression: Secondary | ICD-10-CM | POA: Diagnosis not present

## 2023-01-19 DIAGNOSIS — E538 Deficiency of other specified B group vitamins: Secondary | ICD-10-CM | POA: Diagnosis not present

## 2023-01-19 DIAGNOSIS — E559 Vitamin D deficiency, unspecified: Secondary | ICD-10-CM | POA: Diagnosis not present

## 2023-01-19 DIAGNOSIS — Z1329 Encounter for screening for other suspected endocrine disorder: Secondary | ICD-10-CM | POA: Diagnosis not present

## 2023-02-01 ENCOUNTER — Telehealth: Payer: Self-pay

## 2023-02-01 NOTE — Telephone Encounter (Signed)
[facility]- Discharge Call Backs-Left Voicemail about the following below. 1-Do you have any questions or concerns about yourself as you heal? 2-Any concerns or questions about your baby? 3-How was your stay at the hospital? 4- Did our team work together to care for you? You should be receiving a survey in the mail soon.   We would really appreciate it if you could fill that out for us and return it in the mail.  We value the feedback to make improvements and continue the great work we do.   If you have any questions please feel free to call me back at [phone]  

## 2023-02-14 DIAGNOSIS — Z30011 Encounter for initial prescription of contraceptive pills: Secondary | ICD-10-CM | POA: Diagnosis not present

## 2023-02-14 DIAGNOSIS — F53 Postpartum depression: Secondary | ICD-10-CM | POA: Diagnosis not present

## 2023-09-23 DIAGNOSIS — R519 Headache, unspecified: Secondary | ICD-10-CM | POA: Diagnosis not present

## 2023-09-23 DIAGNOSIS — J029 Acute pharyngitis, unspecified: Secondary | ICD-10-CM | POA: Diagnosis not present

## 2023-09-23 DIAGNOSIS — Z20822 Contact with and (suspected) exposure to covid-19: Secondary | ICD-10-CM | POA: Diagnosis not present

## 2023-09-23 DIAGNOSIS — M791 Myalgia, unspecified site: Secondary | ICD-10-CM | POA: Diagnosis not present

## 2023-09-23 DIAGNOSIS — M545 Low back pain, unspecified: Secondary | ICD-10-CM | POA: Diagnosis not present

## 2023-12-12 IMAGING — CR DG SHOULDER 2+V*L*
1 series · 3 of 3 positions shown · non-contrast
Comparison: None.

CLINICAL DATA: Left shoulder pain related to an injury at the gym
yesterday and reports she felt a pop and felt like something being
pulled apart in her chest.

EXAM:
LEFT SHOULDER - 2+ VIEW

[Series 1: dg shoulder left · 0.14mm/px · 3 of 3 slices shown]
[im 1/3]
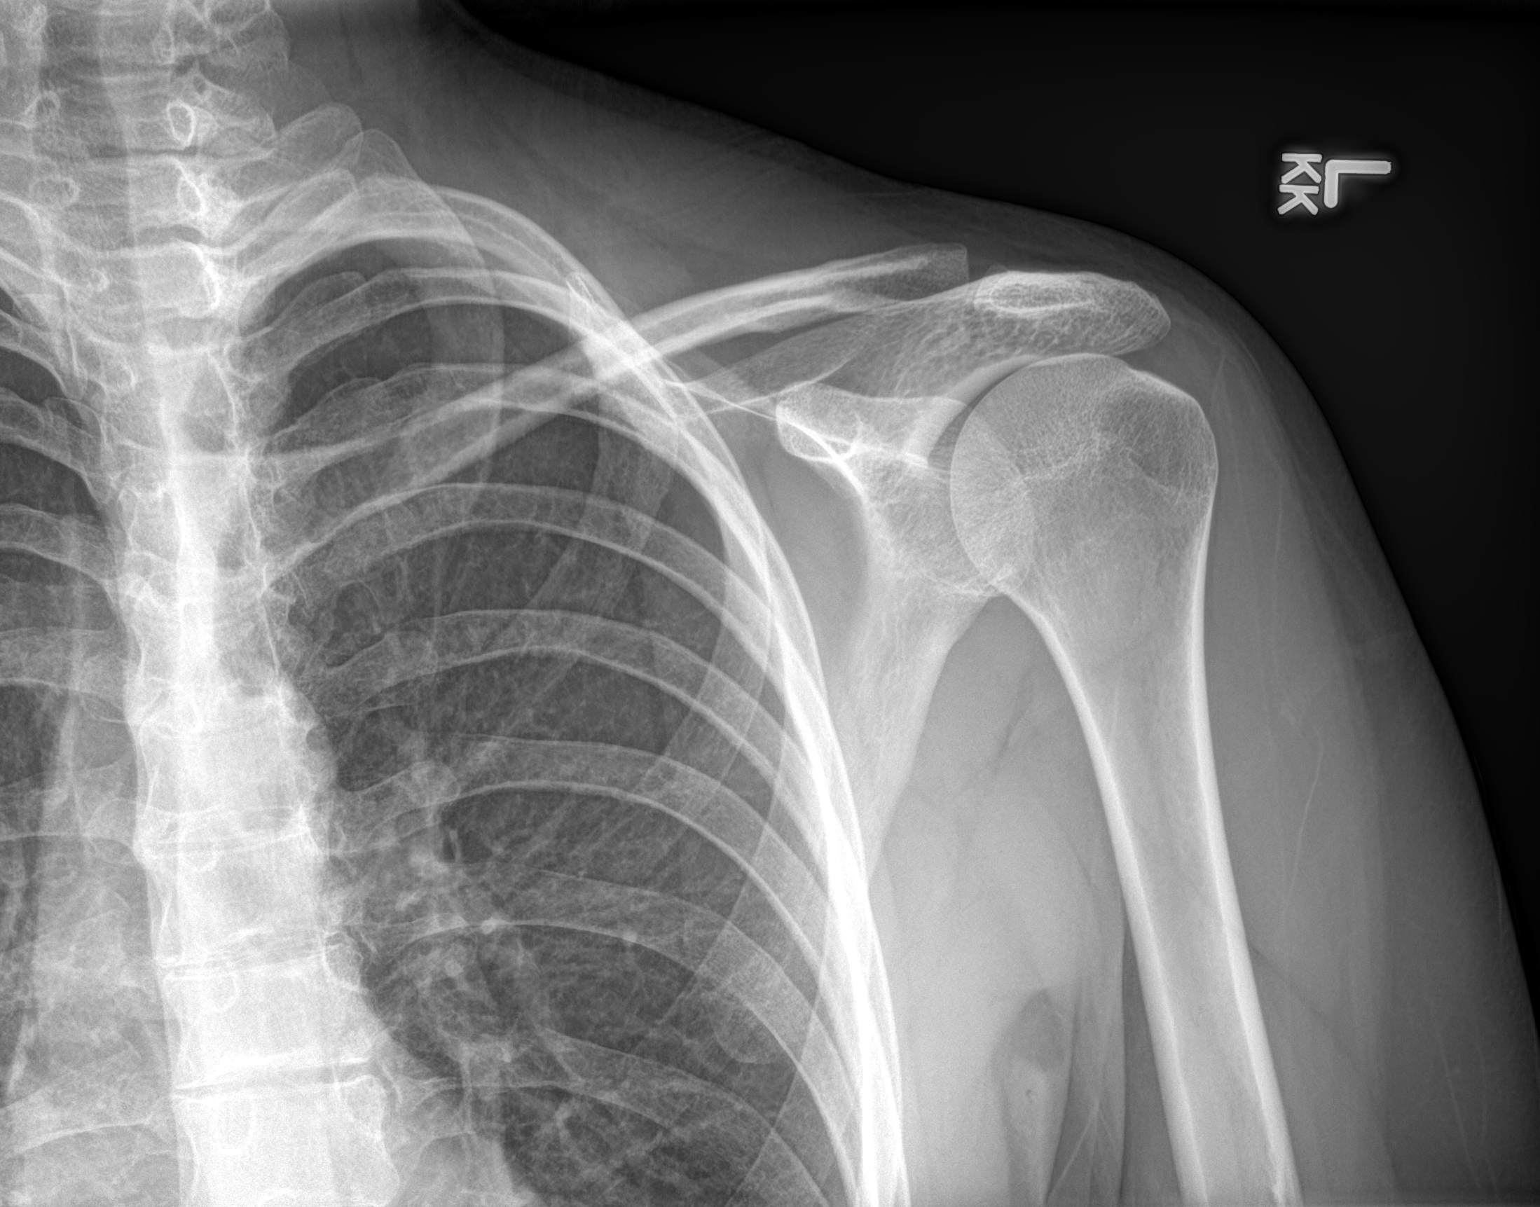
[im 2/3]
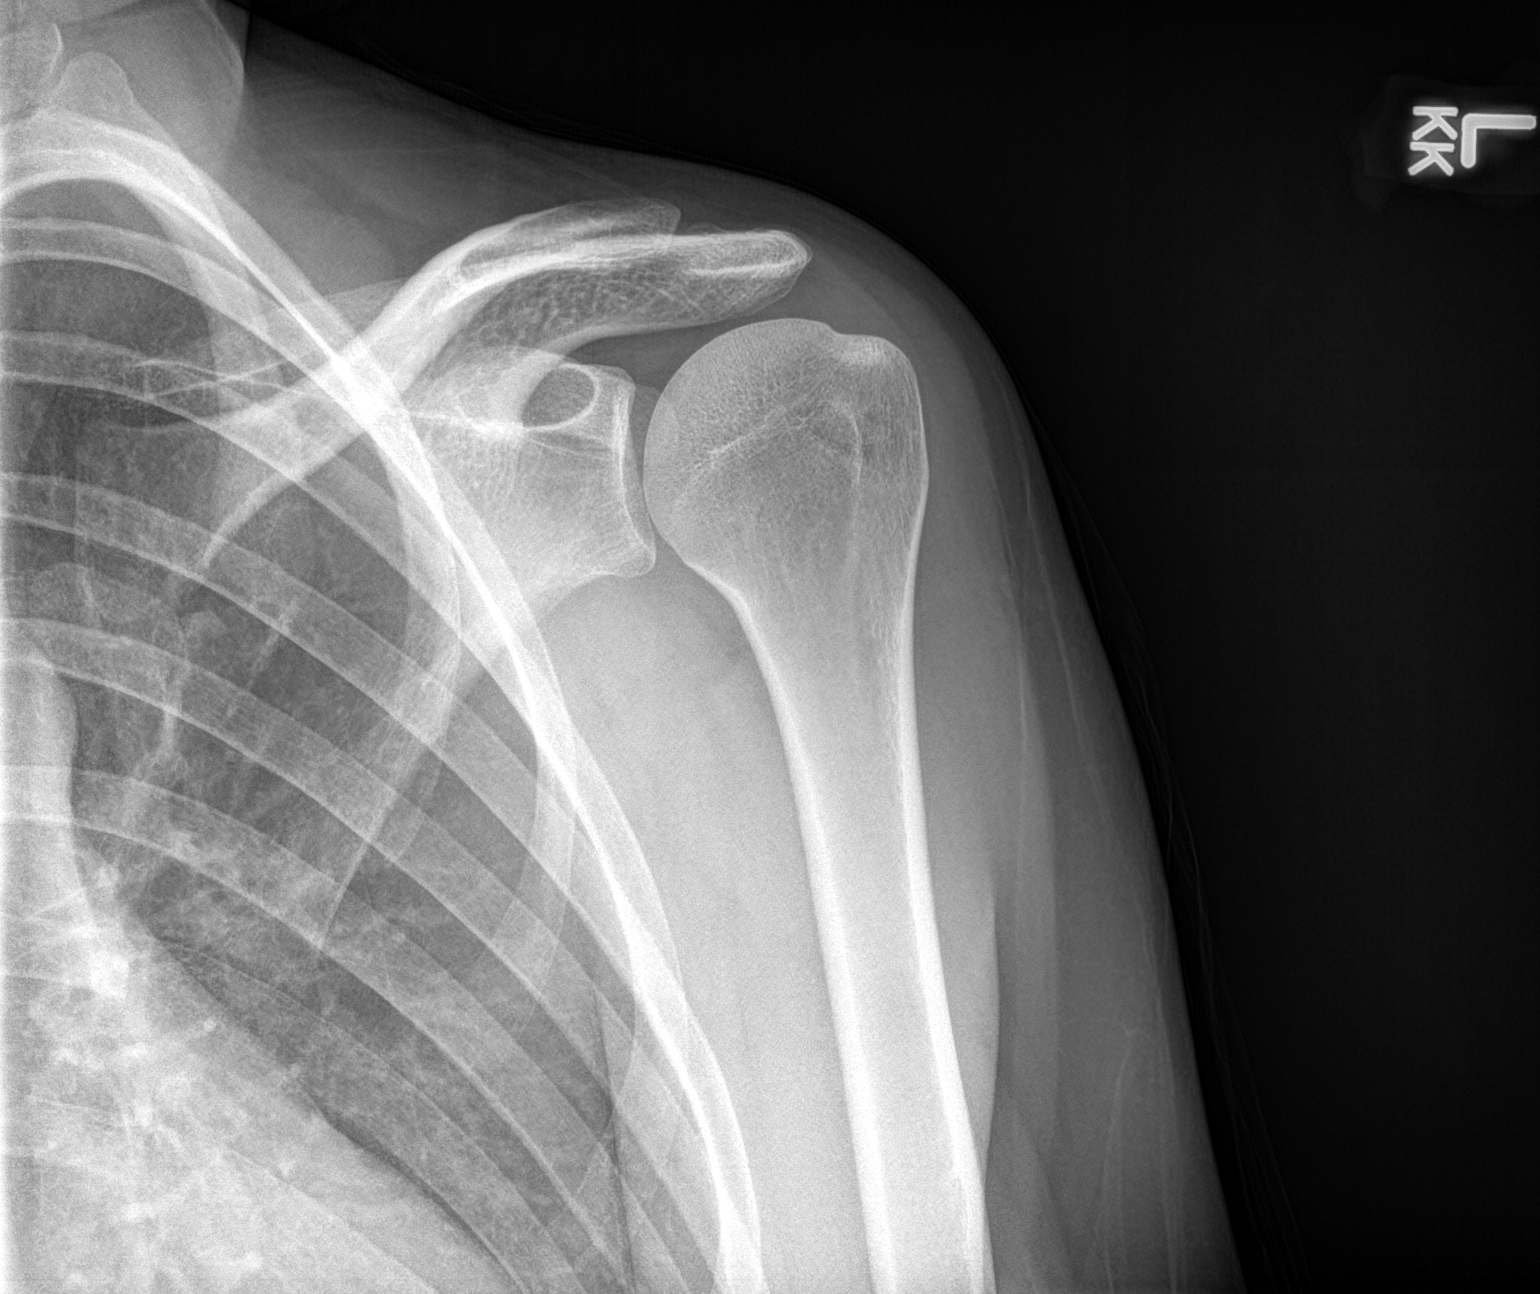
[im 3/3]
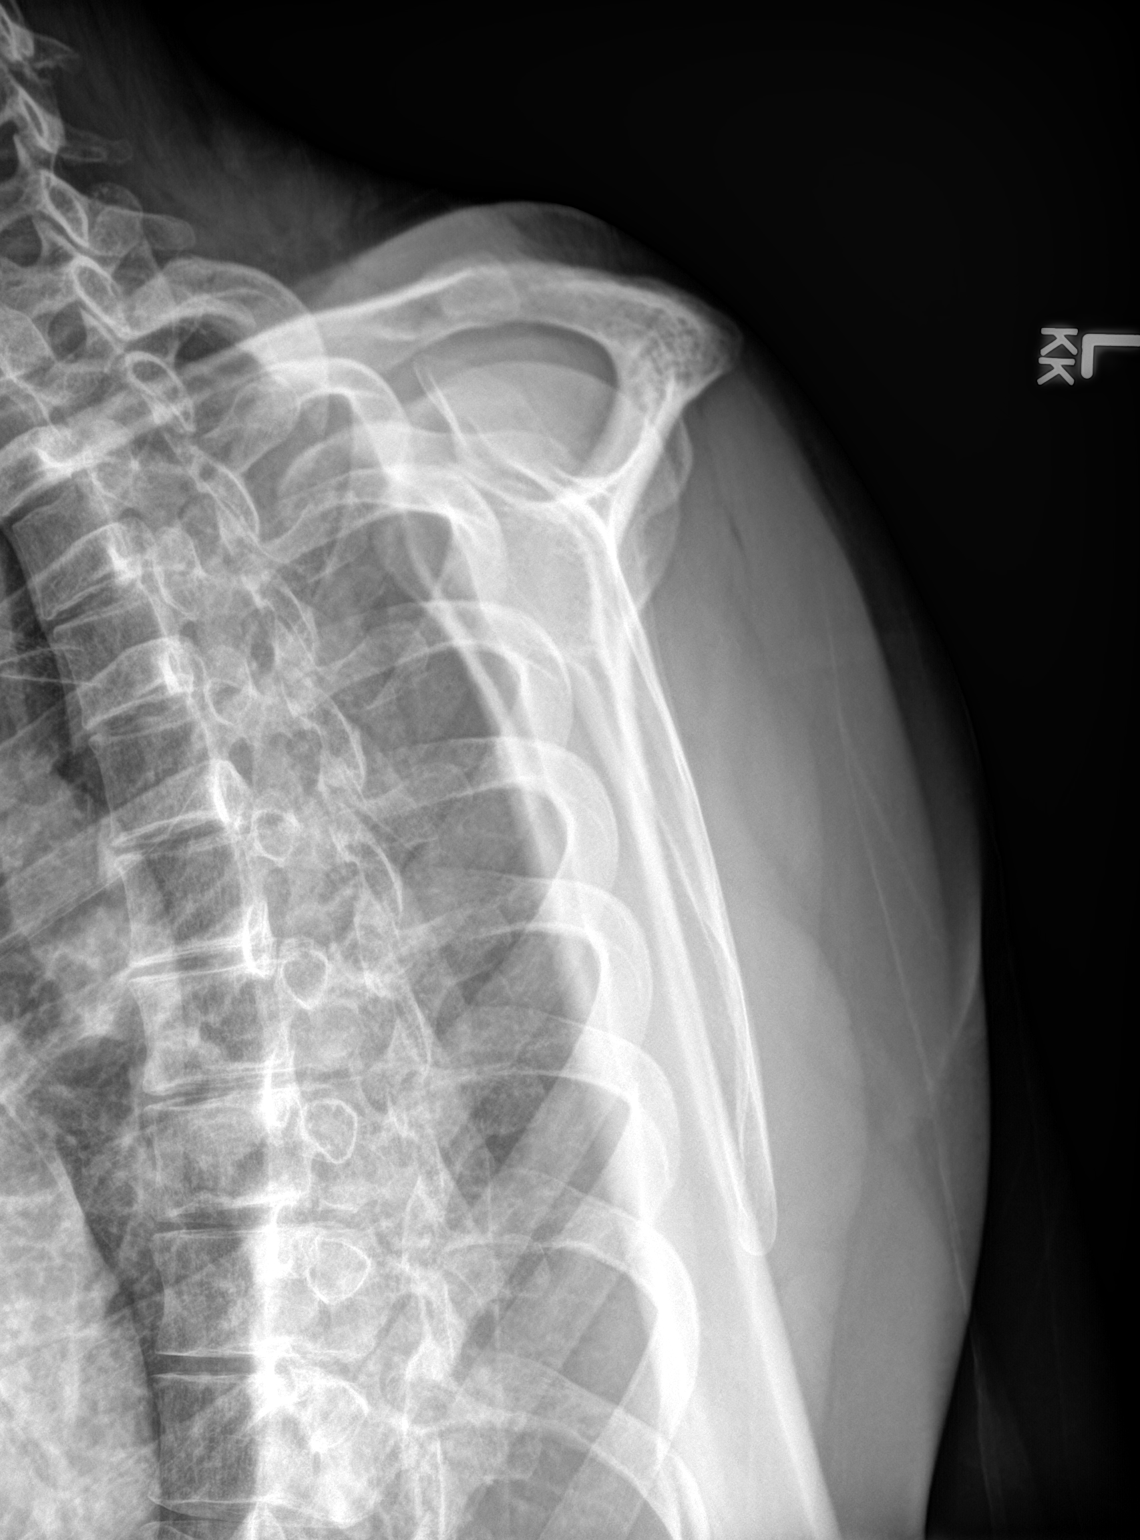

[3 of 3 positions shown; findings below may reference images not displayed]

FINDINGS: There is no evidence of fracture or dislocation. There is no
evidence of arthropathy or other focal bone abnormality. Soft
tissues are unremarkable.
IMPRESSION: Negative.

## 2024-02-01 ENCOUNTER — Ambulatory Visit: Admitting: Family Medicine

## 2024-02-01 DIAGNOSIS — J029 Acute pharyngitis, unspecified: Secondary | ICD-10-CM | POA: Diagnosis not present

## 2024-02-13 DIAGNOSIS — R110 Nausea: Secondary | ICD-10-CM | POA: Diagnosis not present

## 2024-02-13 DIAGNOSIS — R1032 Left lower quadrant pain: Secondary | ICD-10-CM | POA: Diagnosis not present

## 2024-02-13 DIAGNOSIS — R1031 Right lower quadrant pain: Secondary | ICD-10-CM | POA: Diagnosis not present

## 2024-02-16 ENCOUNTER — Ambulatory Visit: Admitting: Nurse Practitioner

## 2024-05-07 DIAGNOSIS — J020 Streptococcal pharyngitis: Secondary | ICD-10-CM | POA: Diagnosis not present

## 2024-05-07 DIAGNOSIS — Z20822 Contact with and (suspected) exposure to covid-19: Secondary | ICD-10-CM | POA: Diagnosis not present

## 2024-05-07 DIAGNOSIS — M545 Low back pain, unspecified: Secondary | ICD-10-CM | POA: Diagnosis not present

## 2024-05-28 ENCOUNTER — Encounter: Payer: Self-pay | Admitting: Family Medicine

## 2024-05-28 ENCOUNTER — Ambulatory Visit: Admitting: Family Medicine

## 2024-05-28 VITALS — BP 106/75 | Resp 16 | Ht 67.0 in | Wt 174.0 lb

## 2024-05-28 DIAGNOSIS — N393 Stress incontinence (female) (male): Secondary | ICD-10-CM | POA: Insufficient documentation

## 2024-05-28 DIAGNOSIS — Z862 Personal history of diseases of the blood and blood-forming organs and certain disorders involving the immune mechanism: Secondary | ICD-10-CM | POA: Diagnosis not present

## 2024-05-28 DIAGNOSIS — Z30011 Encounter for initial prescription of contraceptive pills: Secondary | ICD-10-CM

## 2024-05-28 DIAGNOSIS — N926 Irregular menstruation, unspecified: Secondary | ICD-10-CM | POA: Diagnosis not present

## 2024-05-28 LAB — POCT URINE PREGNANCY: Preg Test, Ur: NEGATIVE

## 2024-05-28 NOTE — Progress Notes (Signed)
 Established patient visit   Patient: Cheryl Morgan   DOB: 10-20-1995   27 y.o. Female  MRN: [id number] Visit Date: 05/28/2024  Today's healthcare provider: Rockie Agent, MD   Chief Complaint  Patient presents with   Contraception     Pt wants to discuss Birth Control wants to control periods..   Subjective     HPI     Contraception    Additional comments:  Pt wants to discuss Birth Control wants to control periods..      Last edited by Marylen Odella CROME, CMA on 05/28/2024 11:06 AM.       Discussed the use of AI scribe software for clinical note transcription with the patient, who gave verbal consent to proceed.  History of Present Illness Cheryl Morgan is a 28 year old female who presents to discuss birth control options.  She previously used an IUD before having a child, which caused issues. Postpartum, she has been taking Junel 1/20 mg birth control pills for three months but ran out three days ago. She started the pills after experiencing a five-month absence of menstruation postpartum. Her period returned with the pills but lasted every day while on the medication. After stopping the pills three days ago, her period ceased immediately.  After giving birth in March 2024, she experienced significant changes in her body, feeling 'almost like pre-menopausal' with symptoms such as night sweats, cold intolerance, and brittle hair. She did not breastfeed and had no menstruation for five months postpartum.  She has a history of anemia and has been taking iron supplements, which she feels helped with circulation issues. She experiences cold extremities and fatigue, which she attributes to potential iron deficiency.  She has had abnormal Pap smears in the past and is concerned about possible hormonal imbalances, including thyroid issues, due to her symptoms of temperature dysregulation and prolonged bleeding.  She describes a sensation of pulling in  her lower abdomen, which she believes may be related to her pregnancy. She has experienced urinary incontinence since before her pregnancy, which worsened postpartum. She describes difficulty with physical activity, stating that exercise exacerbates her symptoms, causing a burning sensation in her lower abdomen.     Past Medical History:  Diagnosis Date   Anemia    COVID-19 05/21/2020   Dysmenorrhea 08/01/2012   Excessive and frequent menstruation 08/01/2012   GERD (gastroesophageal reflux disease)    History of asthma 05/21/2020   History of pneumonia 05/21/2020   Motion sickness    circular motion   Rectal bleeding    Seasonal allergies    Weight gain 08/01/2012    Medications: Outpatient Medications Prior to Visit  Medication Sig   acetaminophen  (TYLENOL ) 500 MG tablet Take 2 tablets (1,000 mg total) by mouth every 6 (six) hours. (Patient not taking: Reported on 05/28/2024)   ibuprofen  (ADVIL ) 600 MG tablet Take 1 tablet (600 mg total) by mouth every 6 (six) hours as needed for mild pain or cramping. (Patient not taking: Reported on 05/28/2024)   omeprazole  (PRILOSEC) 20 MG capsule Take 1 capsule (20 mg total) by mouth daily. (Patient not taking: Reported on 05/28/2024)   Prenatal Vit-Fe Fumarate-FA (PRENATAL MULTIVITAMIN) TABS tablet Take 1 tablet by mouth daily at 12 noon. (Patient not taking: Reported on 05/28/2024)   triamcinolone  (NASACORT ) 55 MCG/ACT AERO nasal inhaler Place 2 sprays into the nose daily. (Patient not taking: Reported on 05/28/2024)   triamcinolone  ointment (KENALOG ) 0.5 % Apply 1 Application topically  2 (two) times daily. Use of pea size amount to area of irritation for max of 7 days (Patient not taking: Reported on 05/28/2024)   No facility-administered medications prior to visit.    Review of Systems  Last CBC Lab Results  Component Value Date   WBC 12.0 (H) 01/03/2023   HGB 10.9 (L) 01/03/2023   HCT 31.1 (L) 01/03/2023   MCV 98.1 01/03/2023   MCH 34.4  (H) 01/03/2023   RDW 12.1 01/03/2023   PLT 153 01/03/2023   Last metabolic panel Lab Results  Component Value Date   GLUCOSE 89 04/18/2022   NA 140 04/18/2022   K 4.3 04/18/2022   CL 102 04/18/2022   CO2 24 04/18/2022   BUN 10 04/18/2022   CREATININE 0.78 04/18/2022   EGFR 107 04/18/2022   CALCIUM 10.1 04/18/2022   PROT 7.6 04/18/2022   ALBUMIN 4.5 04/18/2022   LABGLOB 3.1 04/18/2022   AGRATIO 1.5 04/18/2022   BILITOT 0.4 04/18/2022   ALKPHOS 76 04/18/2022   AST 13 04/18/2022   ALT 13 04/18/2022   ANIONGAP 8 12/10/2011   Last lipids Lab Results  Component Value Date   CHOL 163 07/30/2015   HDL 53 07/30/2015   LDLCALC 93 07/30/2015   TRIG 86 07/30/2015   CHOLHDL 3.1 07/30/2015   Last hemoglobin A1c No results found for: HGBA1C Last thyroid functions Lab Results  Component Value Date   TSH 2.480 04/18/2022   Last vitamin D No results found for: 25OHVITD2, 25OHVITD3, VD25OH Last vitamin B12 and Folate Lab Results  Component Value Date   VITAMINB12 497 04/26/2022   FOLATE 20.2 04/26/2022        Objective    BP 106/75 (BP Location: Right Arm, Patient Position: Sitting, Cuff Size: Normal)   Resp 16   Ht 5' 7 (1.702 m)   Wt 174 lb (78.9 kg)   SpO2 99%   BMI 27.25 kg/m  BP Readings from Last 3 Encounters:  05/28/24 106/75  01/04/23 128/83  11/03/22 105/76   Wt Readings from Last 3 Encounters:  05/28/24 174 lb (78.9 kg)  01/02/23 200 lb (90.7 kg)  11/03/22 188 lb (85.3 kg)        Physical Exam  Physical Exam ABDOMEN: Suprapubic tenderness, no abdominal distention, abdomen non-tender.    Results for orders placed or performed in visit on 05/28/24  POCT urine pregnancy  Result Value Ref Range   Preg Test, Ur Negative Negative    Assessment & Plan     Problem List Items Addressed This Visit       Other   Stress incontinence in female   Chronic  Urinary incontinence with leaking urine during coughing, sneezing, and laughing,  suggesting pelvic floor dysfunction, possibly exacerbated by childbirth. - Refer to pelvic floor physical therapy - Advised on Kegel exercises      Relevant Orders   Ambulatory referral to Physical Therapy   Irregular menses - Primary   Abnormal uterine bleeding with concern for hormonal imbalance Postpartum abnormal uterine bleeding with concern for hormonal imbalance. Prolonged bleeding while on Junel, a low-dose oral contraceptive, with symptoms of night sweats and temperature dysregulation. Differential includes hormonal imbalance, thyroid dysfunction, and potential ovarian issues. Bleeding ceased after stopping Junel. - Order TSH, T4, T3, FSH, LH, prolactin, and testosterone  levels - Consider higher dose oral contraceptive if labs are normal - Refer to gynecologist if labs indicate hormonal imbalance or other issues - Consider endocrinology referral if endocrine issues are suspected - UPT  Negative       Relevant Orders   POCT urine pregnancy (Completed)   TSH+T4F+T3Free   Prolactin   FSH/LH   Testos,Total,Free and SHBG (Female)   CBC   Fe+TIBC+Fer   Other Visit Diagnoses       Encounter for initial prescription of contraceptive pills       Relevant Orders   POCT urine pregnancy (Completed)     History of anemia       Relevant Orders   CBC   Fe+TIBC+Fer        Assessment & Plan  History of iron deficiency anemia Iron deficiency anemia with symptoms of fatigue and cold extremities. Recent prolonged bleeding may contribute to anemia. Previous improvement with iron supplementation. - Order CBC and ferritin levels - Advise on iron supplementation if indicated     Return in about 6 weeks (around 07/09/2024) for pelvic .         Rockie Agent, MD  [facility] [phone] (phone) [phone] (fax)  [facility]

## 2024-05-28 NOTE — Assessment & Plan Note (Signed)
 Chronic  Urinary incontinence with leaking urine during coughing, sneezing, and laughing, suggesting pelvic floor dysfunction, possibly exacerbated by childbirth. - Refer to pelvic floor physical therapy - Advised on Kegel exercises

## 2024-05-28 NOTE — Assessment & Plan Note (Addendum)
 Abnormal uterine bleeding with concern for hormonal imbalance Postpartum abnormal uterine bleeding with concern for hormonal imbalance. Prolonged bleeding while on Junel, a low-dose oral contraceptive, with symptoms of night sweats and temperature dysregulation. Differential includes hormonal imbalance, thyroid dysfunction, and potential ovarian issues. Bleeding ceased after stopping Junel. - Order TSH, T4, T3, FSH, LH, prolactin, and testosterone  levels - Consider higher dose oral contraceptive if labs are normal - Refer to gynecologist if labs indicate hormonal imbalance or other issues - Consider endocrinology referral if endocrine issues are suspected - UPT Negative

## 2024-05-31 ENCOUNTER — Ambulatory Visit: Payer: Self-pay | Admitting: Family Medicine

## 2024-06-01 LAB — CBC
Hematocrit: 44.8 % (ref 34.0–46.6)
Hemoglobin: 14.7 g/dL (ref 11.1–15.9)
MCH: 31.2 pg (ref 26.6–33.0)
MCHC: 32.8 g/dL (ref 31.5–35.7)
MCV: 95 fL (ref 79–97)
Platelets: 445 x10E3/uL (ref 150–450)
RBC: 4.71 x10E6/uL (ref 3.77–5.28)
RDW: 11.6 % — ABNORMAL LOW (ref 11.7–15.4)
WBC: 7.7 x10E3/uL (ref 3.4–10.8)

## 2024-06-01 LAB — IRON,TIBC AND FERRITIN PANEL
Ferritin: 46 ng/mL (ref 15–150)
Iron Saturation: 38 % (ref 15–55)
Iron: 143 ug/dL (ref 27–159)
Total Iron Binding Capacity: 378 ug/dL (ref 250–450)
UIBC: 235 ug/dL (ref 131–425)

## 2024-06-01 LAB — TESTOSTERONE, FREE, DIRECT
Testosterone, Free: 0.5 pg/mL (ref 0.0–4.2)
Testosterone, Total, LC/MS: 53.3 ng/dL (ref 10.0–55.0)

## 2024-06-01 LAB — PROLACTIN: Prolactin: 8.9 ng/mL (ref 4.8–33.4)

## 2024-06-01 LAB — FSH/LH
FSH: 5.1 m[IU]/mL
LH: 8.9 m[IU]/mL

## 2024-06-01 LAB — TSH+T4F+T3FREE
Free T4: 1.22 ng/dL (ref 0.82–1.77)
T3, Free: 2.9 pg/mL (ref 2.0–4.4)
TSH: 0.936 u[IU]/mL (ref 0.450–4.500)

## 2024-06-03 ENCOUNTER — Other Ambulatory Visit: Payer: Self-pay | Admitting: Family Medicine

## 2024-06-03 DIAGNOSIS — N926 Irregular menstruation, unspecified: Secondary | ICD-10-CM

## 2024-06-03 MED ORDER — NORETHINDRONE ACET-ETHINYL EST 1.5-30 MG-MCG PO TABS: 1.0000 | ORAL_TABLET | Freq: Every day | ORAL | 11 refills | Status: AC

## 2024-07-08 ENCOUNTER — Ambulatory Visit: Admitting: Family Medicine

## 2024-07-09 ENCOUNTER — Ambulatory Visit: Admitting: Family Medicine

## 2024-09-02 ENCOUNTER — Ambulatory Visit: Admitting: Family Medicine

## 2024-09-02 NOTE — Progress Notes (Deleted)
      Established patient visit   Patient: Cheryl Morgan   DOB: 08-Aug-1996   27 y.o. Female  MRN: [id number] Visit Date: 09/02/2024  Today's healthcare provider: Rockie Agent, MD   No chief complaint on file.  Subjective       Discussed the use of AI scribe software for clinical note transcription with the patient, who gave verbal consent to proceed.  History of Present Illness      Past Medical History:  Diagnosis Date   Anemia    COVID-19 05/21/2020   Dysmenorrhea 08/01/2012   Excessive and frequent menstruation 08/01/2012   GERD (gastroesophageal reflux disease)    History of asthma 05/21/2020   History of pneumonia 05/21/2020   Motion sickness    circular motion   Rectal bleeding    Seasonal allergies    Weight gain 08/01/2012    Medications: Outpatient Medications Prior to Visit  Medication Sig   Norethindrone  Acetate-Ethinyl Estradiol  (JUNEL 1.5/30) 1.5-30 MG-MCG tablet Take 1 tablet by mouth daily.   No facility-administered medications prior to visit.    Review of Systems  {Insert previous labs (optional):23779} {See past labs  Heme  Chem  Endocrine  Serology  Results Review (optional):1}   Objective    There were no vitals taken for this visit. {Insert last BP/Wt (optional):23777}{See vitals history (optional):1}    Physical Exam  ***  No results found for any visits on 09/02/24.  Assessment & Plan     Problem List Items Addressed This Visit     Irregular menses   Stress incontinence in female - Primary   Other Visit Diagnoses       Cervical cancer screening           Assessment and Plan Assessment & Plan      No follow-ups on file.         Rockie Agent, MD  [facility] [phone] (phone) [phone] (fax)  [facility]

## 2024-10-31 ENCOUNTER — Encounter: Payer: Self-pay | Admitting: Family Medicine

## 2024-10-31 ENCOUNTER — Ambulatory Visit: Admitting: Family Medicine

## 2024-10-31 VITALS — BP 104/66 | HR 76 | Temp 98.1°F | Resp 16 | Ht 67.0 in | Wt 175.8 lb

## 2024-10-31 DIAGNOSIS — F17290 Nicotine dependence, other tobacco product, uncomplicated: Secondary | ICD-10-CM | POA: Diagnosis not present

## 2024-10-31 DIAGNOSIS — Z23 Encounter for immunization: Secondary | ICD-10-CM

## 2024-10-31 DIAGNOSIS — Z13220 Encounter for screening for lipoid disorders: Secondary | ICD-10-CM

## 2024-10-31 DIAGNOSIS — Z130 Encounter for screening for diseases of the blood and blood-forming organs and certain disorders involving the immune mechanism: Secondary | ICD-10-CM

## 2024-10-31 DIAGNOSIS — Z0001 Encounter for general adult medical examination with abnormal findings: Secondary | ICD-10-CM | POA: Diagnosis not present

## 2024-10-31 DIAGNOSIS — Z131 Encounter for screening for diabetes mellitus: Secondary | ICD-10-CM

## 2024-10-31 DIAGNOSIS — Z111 Encounter for screening for respiratory tuberculosis: Secondary | ICD-10-CM

## 2024-10-31 DIAGNOSIS — Z13228 Encounter for screening for other metabolic disorders: Secondary | ICD-10-CM

## 2024-10-31 DIAGNOSIS — Z0000 Encounter for general adult medical examination without abnormal findings: Secondary | ICD-10-CM

## 2024-10-31 NOTE — Patient Instructions (Signed)
 To keep you healthy, please keep in mind the following health maintenance items that you are due for:   Health Maintenance Due  Topic Date Due   Pneumococcal Vaccine (1 of 2 - PCV) Never done   COVID-19 Vaccine (3 - Pfizer risk series) 02/09/2021   Cervical Cancer Screening (Pap smear)  11/11/2022   Influenza Vaccine  05/17/2024     Best Wishes,   Dr. Lang

## 2024-10-31 NOTE — Progress Notes (Unsigned)
 "  Complete physical exam  Patient: Cheryl Morgan    DOB: 04/01/1996 29 y.o.   MRN: [id number]  Chief Complaint  Patient presents with   Annual Exam    Patient is needing an exam for starting radiology program     Subjective:    Cheryl Morgan is a 29 y.o. female who presents today for a complete physical exam.     She does not have additional problems to discuss today.   Discussed the use of AI scribe software for clinical note transcription with the patient, who gave verbal consent to proceed.  History of Present Illness Cheryl Morgan is a 29 year old female who presents for an annual physical exam and cervical cancer screening.  She is starting a radiology program and requires updated immunizations, including a flu vaccine and TB testing with Quantiferon Gold.  She has a history of abnormal cervical cancer screening in 2021 and is due for a Pap smear. She is concerned about previous abnormal results and is considering scheduling the Pap smear separately.  She is currently working on quitting smoking due to her new program requirements and personal health. She has nicotine patches and plans to use them when she decides to quit completely.  She engages in physical activity, working out four days a week at the gym, and walking regularly. She has been consistent with this routine since December and January, and notes improvement in her physical condition without pain.  She mentions having a tough time with her lungs during the winter, indicating a history of respiratory issues.    Most recent fall risk assessment:    10/31/2024    2:33 PM  Fall Risk   Falls in the past year? 0  Number falls in past yr: 0  Injury with Fall? 0  Risk for fall due to : No Fall Risks  Follow up Falls evaluation completed     Most recent depression screenings:    10/31/2024    2:33 PM 11/03/2022   10:40 AM  PHQ 2/9 Scores  PHQ - 2 Score 0 0  PHQ- 9 Score 2 0       Data saved with a previous flowsheet row definition    Patient Care Team: Sharma Coyer, MD as PCP - General (Family Medicine)   ROS    Objective:    BP 104/66 (BP Location: Left Arm, Patient Position: Sitting, Cuff Size: Normal)   Pulse 76   Temp 98.1 F (36.7 C) (Oral)   Resp 16   Ht 5' 7 (1.702 m)   Wt 175 lb 12.8 oz (79.7 kg)   LMP 10/21/2024 (Exact Date)   SpO2 99%   Breastfeeding No   BMI 27.53 kg/m  BP Readings from Last 3 Encounters:  10/31/24 104/66  05/28/24 106/75  01/04/23 128/83   Wt Readings from Last 3 Encounters:  10/31/24 175 lb 12.8 oz (79.7 kg)  05/28/24 174 lb (78.9 kg)  01/02/23 200 lb (90.7 kg)      Physical Exam Vitals reviewed.  Constitutional:      General: She is not in acute distress.    Appearance: Normal appearance. She is not ill-appearing, toxic-appearing or diaphoretic.  HENT:     Head: Normocephalic and atraumatic.     Right Ear: Tympanic membrane and external ear normal. There is no impacted cerumen.     Left Ear: Tympanic membrane and external ear normal. There is no impacted cerumen.     Nose: Nose  normal.     Mouth/Throat:     Pharynx: Oropharynx is clear.  Eyes:     General: No scleral icterus.    Extraocular Movements: Extraocular movements intact.     Conjunctiva/sclera: Conjunctivae normal.     Pupils: Pupils are equal, round, and reactive to light.  Cardiovascular:     Rate and Rhythm: Normal rate and regular rhythm.     Pulses: Normal pulses.     Heart sounds: Normal heart sounds. No murmur heard.    No friction rub. No gallop.  Pulmonary:     Effort: Pulmonary effort is normal. No respiratory distress.     Breath sounds: Normal breath sounds. No wheezing, rhonchi or rales.  Abdominal:     General: Bowel sounds are normal. There is no distension.     Palpations: Abdomen is soft. There is no mass.     Tenderness: There is no abdominal tenderness. There is no guarding.  Musculoskeletal:         General: No deformity.     Cervical back: Normal range of motion and neck supple.     Right lower leg: No edema.     Left lower leg: No edema.  Lymphadenopathy:     Cervical: No cervical adenopathy.  Skin:    General: Skin is warm.     Capillary Refill: Capillary refill takes less than 2 seconds.     Findings: No erythema or rash.  Neurological:     General: No focal deficit present.     Mental Status: She is alert and oriented to person, place, and time.     Cranial Nerves: Cranial nerves 2-12 are intact. No cranial nerve deficit or facial asymmetry.     Motor: Motor function is intact. No weakness.     Gait: Gait normal.  Psychiatric:        Mood and Affect: Mood normal.        Behavior: Behavior normal.     {PhysExam Abridge (Optional):[id number]}  No results found for any visits on 10/31/24. Last CBC Lab Results  Component Value Date   WBC 7.7 05/28/2024   HGB 14.7 05/28/2024   HCT 44.8 05/28/2024   MCV 95 05/28/2024   MCH 31.2 05/28/2024   RDW 11.6 (L) 05/28/2024   PLT 445 05/28/2024   Last metabolic panel Lab Results  Component Value Date   GLUCOSE 89 04/18/2022   NA 140 04/18/2022   K 4.3 04/18/2022   CL 102 04/18/2022   CO2 24 04/18/2022   BUN 10 04/18/2022   CREATININE 0.78 04/18/2022   EGFR 107 04/18/2022   CALCIUM 10.1 04/18/2022   PROT 7.6 04/18/2022   ALBUMIN 4.5 04/18/2022   LABGLOB 3.1 04/18/2022   AGRATIO 1.5 04/18/2022   BILITOT 0.4 04/18/2022   ALKPHOS 76 04/18/2022   AST 13 04/18/2022   ALT 13 04/18/2022   ANIONGAP 8 12/10/2011   Last lipids Lab Results  Component Value Date   CHOL 163 07/30/2015   HDL 53 07/30/2015   LDLCALC 93 07/30/2015   TRIG 86 07/30/2015   CHOLHDL 3.1 07/30/2015   Last hemoglobin A1c No results found for: HGBA1C Last thyroid  functions Lab Results  Component Value Date   TSH 0.936 05/28/2024   FREET4 1.22 05/28/2024   Last vitamin D No results found for: 25OHVITD2, 25OHVITD3, VD25OH Last  vitamin B12 and Folate Lab Results  Component Value Date   VITAMINB12 497 04/26/2022   FOLATE 20.2 04/26/2022  Assessment & Plan:    Routine Health Maintenance and Physical Exam Immunization History  Administered Date(s) Administered   Dtap, Unspecified 11/28/1995, 01/25/1996, 03/20/1996, 12/31/1996   HIB, Unspecified 11/28/1995, 01/25/1996, 03/20/1996, 12/31/1996   HPV Quadrivalent 01/18/2012, 03/19/2012, 08/01/2012   Hep B, Unspecified 06-29-95, 10/19/1995, 03/20/1996   Hepatitis B, ADULT 02/14/2022   Influenza,inj,Quad PF,6+ Mos 07/02/2014, 09/25/2015   MMR 09/23/1996, 02/14/2022   MMRV 02/14/2022   PFIZER Comirnaty(Gray Top)Covid-19 Tri-Sucrose Vaccine 12/22/2020, 01/12/2021   Polio, Unspecified 11/28/1995, 01/25/1996, 12/31/1996   Tdap 05/02/2007, 02/04/2022   Varicella 09/23/1996    Health Maintenance  Topic Date Due   Pneumococcal Vaccine (1 of 2 - PCV) Never done   COVID-19 Vaccine (3 - Pfizer risk series) 02/09/2021   Cervical Cancer Screening (Pap smear)  11/11/2022   Influenza Vaccine  05/17/2024   DTaP/Tdap/Td (7 - Td or Tdap) 02/05/2032   HPV VACCINES  Completed   Hepatitis C Screening  Completed   HIV Screening  Completed   Meningococcal B Vaccine  Aged Out   Hepatitis B Vaccines 19-59 Average Risk  Discontinued    Discussed health benefits of physical activity, and encouraged her to engage in regular exercise appropriate for her age and condition.  Problem List Items Addressed This Visit     Nicotine dependence due to vaping tobacco product   Other Visit Diagnoses       Annual physical exam    -  Primary     Screening for endocrine, metabolic and immunity disorder         Screening for diabetes mellitus (DM)         Screening for lipid disorders         Immunization due         Screening-pulmonary TB         Screening for deficiency anemia           Assessment and Plan Assessment & Plan CPE Annual physical examination conducted.  Discussed the importance of maintaining a well-balanced diet and regular exercise. Encouraged continuation of current exercise regimen and healthy lifestyle choices. - Ordered CMP, A1c, lipid panel, and CBC - Administered flu vaccine - Ordered TB testing with Quant Gold - Administered pneumococcal vaccine due to nicotine dependence  Nicotine dependence, vaping tobacco product Nicotine dependence with vaping tobacco products. Discussed the importance of quitting smoking due to health risks and program requirements. She is using nicotine patches to aid in cessation. - Encouraged continued use of nicotine patches for smoking cessation, counseled for 4 mins on smoking cessation   History of abnormal cervical cancer screening Abnormal cervical cancer screening in 2021. Discussed the need for regular cervical cancer screening and the option to schedule a Pap smear at her convenience. - Will schedule Pap smear in three months    Return in about 3 months (around 01/29/2025) for Pap.    Rockie Agent, MD [facility]   "

## 2024-11-01 NOTE — Assessment & Plan Note (Signed)
 Nicotine dependence, vaping tobacco product Nicotine dependence with vaping tobacco products. Discussed the importance of quitting smoking due to health risks and program requirements. She is using nicotine patches to aid in cessation. - Encouraged continued use of nicotine patches for smoking cessation, counseled for 4 mins on smoking cessation

## 2024-11-01 NOTE — Assessment & Plan Note (Signed)
 CPE Annual physical examination conducted. Discussed the importance of maintaining a well-balanced diet and regular exercise. Encouraged continuation of current exercise regimen and healthy lifestyle choices. - Ordered CMP, A1c, lipid panel, and CBC - Administered flu vaccine  - Ordered TB testing with Quant Gold - Administered pneumococcal vaccine due to nicotine dependence

## 2024-11-19 LAB — QUANTIFERON-TB GOLD PLUS
QuantiFERON Mitogen Value: >10 [IU]/mL
QuantiFERON Nil Value: 0.05 [IU]/mL
QuantiFERON TB1 Ag Value: 0.05 [IU]/mL
QuantiFERON TB2 Ag Value: 0.05 [IU]/mL

## 2024-11-19 LAB — LIPID PANEL
Chol/HDL Ratio: 3.1 ratio (ref 0.0–4.4)
Cholesterol, Total: 172 mg/dL (ref 100–199)
HDL: 56 mg/dL
LDL Chol Calc (NIH): 105 mg/dL — ABNORMAL HIGH (ref 0–99)
Triglycerides: 58 mg/dL (ref 0–149)
VLDL Cholesterol Cal: 11 mg/dL (ref 5–40)

## 2024-11-19 LAB — CBC
Hematocrit: 40.1 % (ref 34.0–46.6)
Hemoglobin: 13.5 g/dL (ref 11.1–15.9)
MCH: 32.5 pg (ref 26.6–33.0)
MCHC: 33.7 g/dL (ref 31.5–35.7)
MCV: 96 fL (ref 79–97)
Platelets: 318 10*3/uL (ref 150–450)
RBC: 4.16 x10E6/uL (ref 3.77–5.28)
RDW: 12.4 % (ref 11.7–15.4)
WBC: 6 10*3/uL (ref 3.4–10.8)

## 2024-11-19 LAB — COMPREHENSIVE METABOLIC PANEL WITH GFR
ALT: 13 [IU]/L (ref 0–32)
AST: 18 [IU]/L (ref 0–40)
Albumin: 4.2 g/dL (ref 4.0–5.0)
Alkaline Phosphatase: 59 [IU]/L (ref 41–116)
BUN/Creatinine Ratio: 13 (ref 9–23)
BUN: 10 mg/dL (ref 6–20)
Bilirubin Total: 0.5 mg/dL (ref 0.0–1.2)
CO2: 20 mmol/L (ref 20–29)
Calcium: 9.3 mg/dL (ref 8.7–10.2)
Chloride: 106 mmol/L (ref 96–106)
Creatinine, Ser: 0.77 mg/dL (ref 0.57–1.00)
Globulin, Total: 2.8 g/dL (ref 1.5–4.5)
Glucose: 90 mg/dL (ref 70–99)
Potassium: 4.4 mmol/L (ref 3.5–5.2)
Sodium: 140 mmol/L (ref 134–144)
Total Protein: 7 g/dL (ref 6.0–8.5)
eGFR: 107 mL/min/{1.73_m2}

## 2024-11-19 LAB — HEMOGLOBIN A1C
Est. average glucose Bld gHb Est-mCnc: 94 mg/dL
Hgb A1c MFr Bld: 4.9 % (ref 4.8–5.6)

## 2024-11-20 ENCOUNTER — Ambulatory Visit: Payer: Self-pay | Admitting: Family Medicine

## 2024-11-25 ENCOUNTER — Ambulatory Visit: Admitting: Family Medicine

## 2024-12-31 ENCOUNTER — Encounter: Admitting: Family Medicine

## 2025-02-17 ENCOUNTER — Ambulatory Visit: Admitting: Family Medicine
